# Patient Record
Sex: Male | Born: 1975
Health system: Southern US, Community
[De-identification: ages and names within clinical notes are randomized; demographics above are authoritative.]

## PROBLEM LIST (undated history)

## (undated) DIAGNOSIS — M1990 Unspecified osteoarthritis, unspecified site: Secondary | ICD-10-CM

## (undated) DIAGNOSIS — F419 Anxiety disorder, unspecified: Secondary | ICD-10-CM

## (undated) DIAGNOSIS — F329 Major depressive disorder, single episode, unspecified: Secondary | ICD-10-CM

## (undated) DIAGNOSIS — T7840XA Allergy, unspecified, initial encounter: Secondary | ICD-10-CM

## (undated) DIAGNOSIS — F32A Depression, unspecified: Secondary | ICD-10-CM

## (undated) HISTORY — PX: ABDOMINAL SURGERY: SHX537

## (undated) HISTORY — DX: Depression, unspecified: F32.A

## (undated) HISTORY — DX: Anxiety disorder, unspecified: F41.9

## (undated) HISTORY — DX: Allergy, unspecified, initial encounter: T78.40XA

## (undated) HISTORY — DX: Unspecified osteoarthritis, unspecified site: M19.90

## (undated) HISTORY — DX: Major depressive disorder, single episode, unspecified: F32.9

## (undated) HISTORY — PX: OTHER SURGICAL HISTORY: SHX169

---

## 1999-08-08 ENCOUNTER — Ambulatory Visit (HOSPITAL_COMMUNITY): Admission: RE | Admit: 1999-08-08 | Discharge: 1999-08-08 | Payer: Self-pay | Admitting: *Deleted

## 1999-08-22 ENCOUNTER — Encounter: Payer: Self-pay | Admitting: Internal Medicine

## 1999-08-22 ENCOUNTER — Ambulatory Visit (HOSPITAL_COMMUNITY): Admission: RE | Admit: 1999-08-22 | Discharge: 1999-08-22 | Payer: Self-pay | Admitting: Internal Medicine

## 1999-09-13 ENCOUNTER — Ambulatory Visit: Admission: RE | Admit: 1999-09-13 | Discharge: 1999-09-13 | Payer: Self-pay | Admitting: Internal Medicine

## 1999-09-13 ENCOUNTER — Encounter (INDEPENDENT_AMBULATORY_CARE_PROVIDER_SITE_OTHER): Payer: Self-pay | Admitting: Specialist

## 2000-07-30 ENCOUNTER — Encounter: Payer: Self-pay | Admitting: Emergency Medicine

## 2000-07-30 ENCOUNTER — Emergency Department (HOSPITAL_COMMUNITY): Admission: EM | Admit: 2000-07-30 | Discharge: 2000-07-31 | Payer: Self-pay | Admitting: Emergency Medicine

## 2001-05-03 ENCOUNTER — Emergency Department (HOSPITAL_COMMUNITY): Admission: EM | Admit: 2001-05-03 | Discharge: 2001-05-04 | Payer: Self-pay | Admitting: Emergency Medicine

## 2001-08-20 ENCOUNTER — Emergency Department (HOSPITAL_COMMUNITY): Admission: EM | Admit: 2001-08-20 | Discharge: 2001-08-20 | Payer: Self-pay | Admitting: Emergency Medicine

## 2001-10-14 ENCOUNTER — Emergency Department (HOSPITAL_COMMUNITY): Admission: EM | Admit: 2001-10-14 | Discharge: 2001-10-14 | Payer: Self-pay | Admitting: Emergency Medicine

## 2002-05-10 ENCOUNTER — Encounter: Payer: Self-pay | Admitting: Family Medicine

## 2002-05-10 ENCOUNTER — Ambulatory Visit (HOSPITAL_COMMUNITY): Admission: RE | Admit: 2002-05-10 | Discharge: 2002-05-10 | Payer: Self-pay | Admitting: Family Medicine

## 2002-05-28 ENCOUNTER — Encounter: Payer: Self-pay | Admitting: Emergency Medicine

## 2002-05-28 ENCOUNTER — Emergency Department (HOSPITAL_COMMUNITY): Admission: AD | Admit: 2002-05-28 | Discharge: 2002-05-28 | Payer: Self-pay | Admitting: Emergency Medicine

## 2002-11-22 ENCOUNTER — Emergency Department (HOSPITAL_COMMUNITY): Admission: EM | Admit: 2002-11-22 | Discharge: 2002-11-22 | Payer: Self-pay | Admitting: Emergency Medicine

## 2002-12-19 ENCOUNTER — Emergency Department (HOSPITAL_COMMUNITY): Admission: EM | Admit: 2002-12-19 | Discharge: 2002-12-20 | Payer: Self-pay | Admitting: Emergency Medicine

## 2004-03-01 ENCOUNTER — Emergency Department (HOSPITAL_COMMUNITY): Admission: EM | Admit: 2004-03-01 | Discharge: 2004-03-01 | Payer: Self-pay | Admitting: Emergency Medicine

## 2005-03-12 ENCOUNTER — Ambulatory Visit: Payer: Self-pay | Admitting: Internal Medicine

## 2005-04-29 ENCOUNTER — Ambulatory Visit: Payer: Self-pay | Admitting: Internal Medicine

## 2005-08-02 ENCOUNTER — Emergency Department (HOSPITAL_COMMUNITY): Admission: EM | Admit: 2005-08-02 | Discharge: 2005-08-02 | Payer: Self-pay | Admitting: Emergency Medicine

## 2005-09-27 ENCOUNTER — Emergency Department (HOSPITAL_COMMUNITY): Admission: EM | Admit: 2005-09-27 | Discharge: 2005-09-27 | Payer: Self-pay | Admitting: Emergency Medicine

## 2006-05-20 ENCOUNTER — Emergency Department (HOSPITAL_COMMUNITY): Admission: EM | Admit: 2006-05-20 | Discharge: 2006-05-20 | Payer: Self-pay | Admitting: Emergency Medicine

## 2007-05-06 ENCOUNTER — Emergency Department (HOSPITAL_COMMUNITY): Admission: EM | Admit: 2007-05-06 | Discharge: 2007-05-06 | Payer: Self-pay | Admitting: Emergency Medicine

## 2007-06-11 DIAGNOSIS — D869 Sarcoidosis, unspecified: Secondary | ICD-10-CM | POA: Insufficient documentation

## 2007-06-15 ENCOUNTER — Ambulatory Visit: Payer: Self-pay | Admitting: Internal Medicine

## 2007-06-15 DIAGNOSIS — E669 Obesity, unspecified: Secondary | ICD-10-CM | POA: Insufficient documentation

## 2007-06-16 LAB — CONVERTED CEMR LAB
ALT: 31 units/L (ref 0–53)
AST: 29 units/L (ref 0–37)
Alkaline Phosphatase: 43 units/L (ref 39–117)
Bilirubin, Direct: 0.1 mg/dL (ref 0.0–0.3)
CO2: 29 meq/L (ref 19–32)
Calcium: 9.3 mg/dL (ref 8.4–10.5)
Chloride: 109 meq/L (ref 96–112)
Eosinophils Relative: 3 % (ref 0.0–5.0)
Glucose, Bld: 114 mg/dL — ABNORMAL HIGH (ref 70–99)
HCT: 41.9 % (ref 39.0–52.0)
Hemoglobin, Urine: NEGATIVE
Hemoglobin: 13.5 g/dL (ref 13.0–17.0)
Monocytes Absolute: 0.3 10*3/uL (ref 0.1–1.0)
Monocytes Relative: 8.1 % (ref 3.0–12.0)
Mucus, UA: NEGATIVE
Neutro Abs: 2.2 10*3/uL (ref 1.4–7.7)
Platelets: 217 10*3/uL (ref 150–400)
Potassium: 3.7 meq/L (ref 3.5–5.1)
Sodium: 143 meq/L (ref 135–145)
Total Bilirubin: 0.9 mg/dL (ref 0.3–1.2)
Total Protein, Urine: NEGATIVE mg/dL
Total Protein: 7.1 g/dL (ref 6.0–8.3)
Urine Glucose: NEGATIVE mg/dL
WBC: 3.7 10*3/uL — ABNORMAL LOW (ref 4.5–10.5)
pH: 5.5 (ref 5.0–8.0)

## 2008-07-11 ENCOUNTER — Encounter: Admission: RE | Admit: 2008-07-11 | Discharge: 2008-07-11 | Payer: Self-pay | Admitting: Family Medicine

## 2008-08-09 ENCOUNTER — Emergency Department (HOSPITAL_COMMUNITY): Admission: EM | Admit: 2008-08-09 | Discharge: 2008-08-09 | Payer: Self-pay | Admitting: Emergency Medicine

## 2008-10-03 ENCOUNTER — Emergency Department (HOSPITAL_COMMUNITY): Admission: EM | Admit: 2008-10-03 | Discharge: 2008-10-03 | Payer: Self-pay | Admitting: Emergency Medicine

## 2009-02-08 ENCOUNTER — Emergency Department (HOSPITAL_COMMUNITY): Admission: EM | Admit: 2009-02-08 | Discharge: 2009-02-08 | Payer: Self-pay | Admitting: Emergency Medicine

## 2009-03-16 ENCOUNTER — Emergency Department (HOSPITAL_COMMUNITY): Admission: EM | Admit: 2009-03-16 | Discharge: 2009-03-16 | Payer: Self-pay | Admitting: Emergency Medicine

## 2009-05-13 ENCOUNTER — Emergency Department (HOSPITAL_COMMUNITY): Admission: EM | Admit: 2009-05-13 | Discharge: 2009-05-13 | Payer: Self-pay | Admitting: Emergency Medicine

## 2009-07-03 ENCOUNTER — Emergency Department (HOSPITAL_COMMUNITY): Admission: EM | Admit: 2009-07-03 | Discharge: 2009-07-03 | Payer: Self-pay | Admitting: Emergency Medicine

## 2009-09-06 ENCOUNTER — Emergency Department (HOSPITAL_COMMUNITY): Admission: EM | Admit: 2009-09-06 | Discharge: 2009-09-06 | Payer: Self-pay | Admitting: Emergency Medicine

## 2010-01-02 ENCOUNTER — Emergency Department (HOSPITAL_COMMUNITY): Admission: EM | Admit: 2010-01-02 | Discharge: 2010-01-02 | Payer: Self-pay | Admitting: Emergency Medicine

## 2010-05-22 LAB — GC/CHLAMYDIA PROBE AMP, GENITAL

## 2010-05-22 LAB — URINALYSIS, ROUTINE W REFLEX MICROSCOPIC
Nitrite: NEGATIVE
Specific Gravity, Urine: 1.02 (ref 1.005–1.030)
Urobilinogen, UA: 1 mg/dL (ref 0.0–1.0)

## 2010-06-11 LAB — URINE MICROSCOPIC-ADD ON

## 2010-06-11 LAB — URINALYSIS, ROUTINE W REFLEX MICROSCOPIC
Bilirubin Urine: NEGATIVE
Hgb urine dipstick: NEGATIVE
Nitrite: NEGATIVE
Specific Gravity, Urine: 1.032 — ABNORMAL HIGH (ref 1.005–1.030)
pH: 5.5 (ref 5.0–8.0)

## 2010-06-11 LAB — GC/CHLAMYDIA PROBE AMP, GENITAL

## 2010-07-20 NOTE — Consult Note (Signed)
NAME:  Grant Miller, Grant Miller                         ACCOUNT NO.:  [id number]   MEDICAL RECORD NO.:  [id number]                   PATIENT TYPE:  EMS   LOCATION:  [facility]                                 FACILITY:  [facility]   PHYSICIAN:  Colleen Can. Deborah Chalk, M.D.            DATE OF BIRTH:  [birth year]   DATE OF CONSULTATION:  05/28/2002  DATE OF DISCHARGE:  05/28/2002                                   CONSULTATION   HISTORY:  Grant Miller is a 35 year old black male with a history of pulmonary  sarcoid.  He had the onset of shortness of breath today, similar to previous  episodes from the sarcoid.  He has currently been on prednisone per Dr.  Sherene Sires.  He took several puffs of his brother's inhaler.  He had the onset of  a tachycardia, associated anxiety, nausea, vomiting, and shortness of  breath.  EMS was called.  The patient was noted to have a blood pressure of  40 palpable and a heart rate of 322.  The QRS complex was .11.  He was  cardioverted with 50 watt seconds to normal sinus rhythm. He has had some  degree of left arm pain, both before and after the cardioversion but  otherwise has been doing well.   SOCIAL HISTORY:  He is seen with both parents and an aunt, who he lives  with.  He works Curator work.  He does not smoke.  He will  drink alcohol, 4-6 beers every other day but have periods of not drinking.  He has used cocaine in the past, the last being approximately 5-6 days ago.   PAST MEDICAL HISTORY:  Otherwise is unremarkable.   CURRENT MEDICATIONS:  Prednisone 10 mg twice a day.   PHYSICAL EXAMINATION:  GENERAL: He is a pleasant black male but is difficult  to understand. His voice is low.  VITAL SIGNS:  Blood pressure is 123/79.  Heart rate is 71.  SKIN: Warm and dry.  LUNGS:  Clear.  HEART:  Shows a regular rate and rhythm.  Currently no murmur, no gallop.  EXTREMITIES: Without edema.  NEUROLOGICAL:  Speech is somewhat slurred and hard to understand but no  neurological deficit.   LABORATORY DATA:  Chest x-ray shows mild cardiomegaly and a patchy  infiltrate in the left upper lobe.   The EKG initially showed SVT on the monitor.  Subsequent EKG showed normal  sinus rhythm with T-wave inversion in the inferior and lateral leads.   CK total is 323 with an MB of 10.6.  Troponin 0.19.  Potassium is 3.4.   OVERALL IMPRESSION:  1. Supraventricular tachycardia, probably secondary to inhaler use with     elevation of CK-MB felt to be secondarily related to cardioversion.  2. Sarcoidosis.    PLAN:  We will ask him to stay away from bronchodilators, ethanol, and  recreational drugs.  He will continue his prednisone and follow up  per Dr.  Sherene Sires.  He will rest over the next 2-3 days.  We will arrange for a 2D  echocardiogram and follow up with Dr. Deborah Chalk as an outpatient over the next  1-2 weeks.                                               Colleen Can. Deborah Chalk, M.D.    SNT/MEDQ  D:  05/28/2002  T:  05/30/2002  Job:  [id number]

## 2010-07-20 NOTE — Procedures (Signed)
[facility]  Patient:    Grant Miller, Grant Miller                        MRN: [id number] Proc. Date: 09/13/99 Attending:  Charlaine Dalton. Sherene Sires, M.D. [facility] CC:         Crista Luria, M.D., [facility]                           Procedure Report  REQUESTING PHYSICIAN:  Crista Luria, M.D.  PROCEDURE:  Fiberoptic bronchoscopy with transbronchial biopsy of the left lower lobe.  SURGEON:  Charlaine Dalton. Sherene Sires, M.D.  HISTORY AND INDICATIONS:  This is a 35 year old black male with persistent cough for three months and evidence of diffuse interstitial lung disease associated with adenopathy on chest x-ray.  He had initially declined a biopsy when I saw him on September 11, 1999, but his mother talked him into it today after I had discussed the risks, benefits and alternatives with him in detail in the office.  DESCRIPTION OF PROCEDURE:  The procedure was done in the bronchoscopy suite with continuous monitoring by surface ECG and oximetry.  He received a total of 50 mg of IV Demerol and 7.5 mg of IV Versed for adequate sedation and cough suppression.  The right naris and oropharynx had been anesthetized with ______  lidocaine spray. The right naris was additionally prepared with 2% lidocaine jelly.  Using a standard flexible fiberoptic bronchoscope, the right naris was easily cannulated and there was good visualization of the oropharynx and larynx with no apparent abnormalities and the cords appeared to move normally.  Using additional 1% lidocaine as needed, the entire tracheobronchial tree was explored bilaterally with the following findings:  There was minimal erythema and edema of the airways diffuse, with no focal endobronchial lesion.  PROCEDURE:  Using a standard transbronchial wedge position, left lower lobe, basilar segment laterally, four adequate biopsies were obtained by standard transbronchial technique, with no obvious bleeding nor any evidence of pneumothorax by  fluoroscopy.  Additionally, the lingula was lavaged.  IMPRESSION:  Interstitial lung disease associated with adenopathy, consistent with sarcoid.  RECOMMENDATION:  Await the following studies: 1. Transbronchial biopsy of the left lower lobe. 2. Bronchoalveolar lavage of the lingula for AFB and fungal stain and culture.  In the meantime, the patient will continue prednisone 20 mg per day.  The patient tolerated the procedure well and followup chest x-ray is pending at the time of dictation. DD:  09/13/99 TD:  09/13/99 Job: [id number]

## 2011-03-19 DIAGNOSIS — R5381 Other malaise: Secondary | ICD-10-CM | POA: Diagnosis not present

## 2011-03-19 DIAGNOSIS — Z79899 Other long term (current) drug therapy: Secondary | ICD-10-CM | POA: Diagnosis not present

## 2011-03-19 DIAGNOSIS — R5383 Other fatigue: Secondary | ICD-10-CM | POA: Diagnosis not present

## 2011-03-19 DIAGNOSIS — D72819 Decreased white blood cell count, unspecified: Secondary | ICD-10-CM | POA: Diagnosis not present

## 2011-04-02 ENCOUNTER — Telehealth: Payer: Self-pay | Admitting: Oncology

## 2011-04-02 NOTE — Telephone Encounter (Signed)
Called pt @ number listed under home and on referral but was not able to reach him or leave message due to vm full. Called number listed under mobile and lmonvm asking pt to call me re appt w/FS.

## 2011-04-04 ENCOUNTER — Telehealth: Payer: Self-pay | Admitting: Oncology

## 2011-04-04 NOTE — Telephone Encounter (Signed)
Still not able to reach pt re appt w/FS. Not able to reach pt @ [phone] - called [phone] and lmonvm for pt re calling me for appt w/FS also informed pt that I am only able to hold onto referral for 72hrs before releasing it and informing referring office that pt could not be reached. Called dr Min's office @ [facility] [phone]) and s/w Shanda Bumps re above and asked for additional contact information. Shanda Bumps provided the same numbers as listed above and was made aware that I will hold onto the chart until tomorrow but if I don't hear from pt referral would be released and if pt wishes to schedule appt he can call us or contact Dr. Dorthula Rue office.

## 2011-04-08 ENCOUNTER — Telehealth: Payer: Self-pay | Admitting: Oncology

## 2011-04-08 ENCOUNTER — Ambulatory Visit (INDEPENDENT_AMBULATORY_CARE_PROVIDER_SITE_OTHER): Payer: Self-pay | Admitting: Surgery

## 2011-04-08 NOTE — Telephone Encounter (Signed)
S/w pt today re appt for 2/8 @ 1:30 pm w/FS.

## 2011-04-10 ENCOUNTER — Telehealth: Payer: Self-pay | Admitting: Oncology

## 2011-04-10 NOTE — Telephone Encounter (Signed)
Del.Chart °

## 2011-04-11 ENCOUNTER — Other Ambulatory Visit: Payer: Self-pay | Admitting: Oncology

## 2011-04-11 DIAGNOSIS — F329 Major depressive disorder, single episode, unspecified: Secondary | ICD-10-CM | POA: Diagnosis not present

## 2011-04-11 DIAGNOSIS — F411 Generalized anxiety disorder: Secondary | ICD-10-CM | POA: Diagnosis not present

## 2011-04-11 DIAGNOSIS — D141 Benign neoplasm of larynx: Secondary | ICD-10-CM | POA: Diagnosis not present

## 2011-04-11 DIAGNOSIS — J387 Other diseases of larynx: Secondary | ICD-10-CM | POA: Diagnosis not present

## 2011-04-11 DIAGNOSIS — Z01818 Encounter for other preprocedural examination: Secondary | ICD-10-CM | POA: Diagnosis not present

## 2011-04-11 DIAGNOSIS — D72829 Elevated white blood cell count, unspecified: Secondary | ICD-10-CM

## 2011-04-12 ENCOUNTER — Ambulatory Visit: Payer: Self-pay | Admitting: Oncology

## 2011-04-12 ENCOUNTER — Ambulatory Visit: Payer: Medicare Other

## 2011-04-12 ENCOUNTER — Other Ambulatory Visit: Payer: Medicare Other

## 2011-04-12 DIAGNOSIS — D72829 Elevated white blood cell count, unspecified: Secondary | ICD-10-CM

## 2011-04-15 DIAGNOSIS — J383 Other diseases of vocal cords: Secondary | ICD-10-CM | POA: Diagnosis not present

## 2011-04-15 DIAGNOSIS — L988 Other specified disorders of the skin and subcutaneous tissue: Secondary | ICD-10-CM | POA: Diagnosis not present

## 2011-04-15 DIAGNOSIS — J381 Polyp of vocal cord and larynx: Secondary | ICD-10-CM | POA: Diagnosis not present

## 2011-07-17 ENCOUNTER — Telehealth: Payer: Self-pay | Admitting: *Deleted

## 2011-07-17 NOTE — Telephone Encounter (Signed)
Call forwarded to this nurse from scheduling. Rec'd call from patient that was to be seen in 04/2011 as a new patient consult from Dr Dellis Filbert @ [phone]) On day of appt, patient received a call while waiting in the lobby and could not complete appts that day. His physician has asked him to call us to r/s this consult. We will have to obtain records again as we were unable to contact the patient for r/s back in 04/2011 and referral was cancelled. Will send orders to scheduling to have this appt rescheduled in the next 1-2 weeks and acquire records again from the original referring MD. Patient call back number is [phone], he ask if we get his voicemail to PLEASE leave a message with name and call back number.

## 2011-07-18 ENCOUNTER — Telehealth: Payer: Self-pay | Admitting: Oncology

## 2011-07-18 NOTE — Telephone Encounter (Signed)
S/w th3e pt and he is aware of his r/s appt on 07/31/2011@10 :00am

## 2011-07-23 ENCOUNTER — Telehealth: Payer: Self-pay | Admitting: Oncology

## 2011-07-23 NOTE — Telephone Encounter (Signed)
Referred by Min Dx- Low WBC

## 2011-07-26 ENCOUNTER — Other Ambulatory Visit: Payer: Self-pay | Admitting: Oncology

## 2011-07-26 DIAGNOSIS — D72819 Decreased white blood cell count, unspecified: Secondary | ICD-10-CM

## 2011-07-31 ENCOUNTER — Ambulatory Visit (HOSPITAL_BASED_OUTPATIENT_CLINIC_OR_DEPARTMENT_OTHER): Payer: Medicare Other | Admitting: Oncology

## 2011-07-31 ENCOUNTER — Other Ambulatory Visit: Payer: Medicare Other | Admitting: Lab

## 2011-07-31 ENCOUNTER — Ambulatory Visit: Payer: Medicare Other

## 2011-07-31 ENCOUNTER — Encounter: Payer: Self-pay | Admitting: Oncology

## 2011-07-31 ENCOUNTER — Telehealth: Payer: Self-pay | Admitting: Oncology

## 2011-07-31 VITALS — BP 136/74 | HR 72 | Temp 98.4°F | Ht 70.0 in | Wt 222.8 lb

## 2011-07-31 DIAGNOSIS — D72819 Decreased white blood cell count, unspecified: Secondary | ICD-10-CM

## 2011-07-31 DIAGNOSIS — E663 Overweight: Secondary | ICD-10-CM | POA: Diagnosis not present

## 2011-07-31 DIAGNOSIS — D869 Sarcoidosis, unspecified: Secondary | ICD-10-CM

## 2011-07-31 LAB — COMPREHENSIVE METABOLIC PANEL
ALT: 26 U/L (ref 0–53)
AST: 23 U/L (ref 0–37)
Alkaline Phosphatase: 65 U/L (ref 39–117)
Sodium: 141 mEq/L (ref 135–145)
Total Bilirubin: 0.5 mg/dL (ref 0.3–1.2)
Total Protein: 6.7 g/dL (ref 6.0–8.3)

## 2011-07-31 LAB — CBC WITH DIFFERENTIAL/PLATELET
EOS%: 3.2 % (ref 0.0–7.0)
Eosinophils Absolute: 0.1 10*3/uL (ref 0.0–0.5)
MCH: 27.8 pg (ref 27.2–33.4)
MCV: 85 fL (ref 79.3–98.0)
MONO%: 7.8 % (ref 0.0–14.0)
NEUT#: 1.6 10*3/uL (ref 1.5–6.5)
RBC: 4.7 10*6/uL (ref 4.20–5.82)
RDW: 13.4 % (ref 11.0–14.6)
nRBC: 0 % (ref 0–0)

## 2011-07-31 NOTE — Progress Notes (Signed)
Patient came in today he was set up as a new patient , patient had already saw a financial advocate on 04/12/11, patient was not in need of fin. Visit today.
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
## 2011-07-31 NOTE — Progress Notes (Signed)
Note dictated

## 2011-07-31 NOTE — Telephone Encounter (Signed)
appts made and printed for pt, i cx his ref to estab with [facility] because pt has [facility] which means he has a phy that has been assigned to him,pt confirmed that yes he has a dr in [place].  Pt advised that  He can call is sw to get card changed   aom

## 2012-01-22 ENCOUNTER — Other Ambulatory Visit: Payer: Medicare Other | Admitting: Lab

## 2012-01-22 ENCOUNTER — Ambulatory Visit: Payer: Medicare Other | Admitting: Oncology

## 2012-01-27 ENCOUNTER — Telehealth: Payer: Self-pay | Admitting: Oncology

## 2012-01-27 NOTE — Telephone Encounter (Signed)
pt missed appt and needed rescheduling...done

## 2012-02-07 ENCOUNTER — Telehealth: Payer: Self-pay | Admitting: Oncology

## 2012-02-07 ENCOUNTER — Ambulatory Visit (HOSPITAL_BASED_OUTPATIENT_CLINIC_OR_DEPARTMENT_OTHER): Payer: Medicare Other | Admitting: Oncology

## 2012-02-07 ENCOUNTER — Other Ambulatory Visit (HOSPITAL_BASED_OUTPATIENT_CLINIC_OR_DEPARTMENT_OTHER): Payer: Medicare Other

## 2012-02-07 VITALS — BP 120/74 | HR 68 | Temp 98.8°F | Resp 20 | Ht 70.0 in | Wt 225.3 lb

## 2012-02-07 DIAGNOSIS — E663 Overweight: Secondary | ICD-10-CM | POA: Diagnosis not present

## 2012-02-07 DIAGNOSIS — D869 Sarcoidosis, unspecified: Secondary | ICD-10-CM

## 2012-02-07 DIAGNOSIS — D709 Neutropenia, unspecified: Secondary | ICD-10-CM | POA: Diagnosis not present

## 2012-02-07 DIAGNOSIS — D72819 Decreased white blood cell count, unspecified: Secondary | ICD-10-CM

## 2012-02-07 LAB — CBC WITH DIFFERENTIAL/PLATELET
BASO%: 0.7 % (ref 0.0–2.0)
EOS%: 2.9 % (ref 0.0–7.0)
HCT: 42.2 % (ref 38.4–49.9)
LYMPH%: 52.2 % — ABNORMAL HIGH (ref 14.0–49.0)
MCH: 28.1 pg (ref 27.2–33.4)
MCHC: 33.3 g/dL (ref 32.0–36.0)
NEUT%: 33.8 % — ABNORMAL LOW (ref 39.0–75.0)
Platelets: 227 10*3/uL (ref 140–400)

## 2012-02-07 NOTE — Progress Notes (Signed)
Hematology and Oncology Follow Up Visit  Rajendra Spiller [id number] 09-22-1975 36 y.o. 02/07/2012 2:59 PM   Principle Diagnosis: 36 year old with mild neutropenia. This is likely due to benign fluctuating process.    Current therapy: Observation and follow up.   Interim History: Mr. Hickam presents today for a follow up visit. He is a very nice man I saw on 07/2011 for neutropenia. 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 He is reporting mild fatigue but no other issues.  He had not had any fevers, had not had any chills, had not had any recurrent sinopulmonary infection,  had not had any recent hospitalization or illnesses. He has some intentional weight loss at this point.   Medications: I have reviewed the patient's current medications. Current outpatient prescriptions:Diphenhydramine-APAP, sleep, (TYLENOL PM EXTRA STRENGTH PO), Take by mouth., Disp: , Rfl:   Allergies:  Allergies  Allergen Reactions  . Iodine     Past Medical History, Surgical history, Social history, and Family History were reviewed and updated.  Review of Systems: Constitutional:  Negative for fever, chills, night sweats, anorexia, weight loss, pain. Cardiovascular: no chest pain or dyspnea on exertion Respiratory: negative Neurological: negative Dermatological: negative ENT: negative Skin: Negative. Gastrointestinal: negative Genito-Urinary: negative Hematological and Lymphatic: negative Breast: negative Musculoskeletal: negative Remaining ROS negative. Physical Exam: Blood pressure 120/74, pulse 68, temperature 98.8 F (37.1 C), temperature source Oral, resp. rate 20, height 5\' 10"  (1.778 m), weight 225 lb 4.8 oz (102.195 kg). ECOG: 0 General appearance: alert Head: Normocephalic, without obvious abnormality, atraumatic Neck: no adenopathy, no carotid bruit, no JVD, supple, symmetrical, trachea midline and thyroid not enlarged, symmetric, no tenderness/mass/nodules Lymph nodes: Cervical, supraclavicular, and  axillary nodes normal. Heart:regular rate and rhythm, S1, S2 normal, no murmur, click, rub or gallop Lung:chest clear, no wheezing, rales, normal symmetric air entry Abdomin: soft, non-tender, without masses or organomegaly EXT:no erythema, induration, or nodules   Lab Results: Lab Results  Component Value Date   WBC 4.0 02/07/2012   HGB 14.0 02/07/2012   HCT 42.2 02/07/2012   MCV 84.4 02/07/2012   PLT 227 02/07/2012     Chemistry      Component Value Date/Time   NA 141 07/31/2011 0925   K 3.9 07/31/2011 0925   CL 107 07/31/2011 0925   CO2 26 07/31/2011 0925   BUN 14 07/31/2011 0925   CREATININE 1.04 07/31/2011 0925      Component Value Date/Time   CALCIUM 9.3 07/31/2011 0925   ALKPHOS 65 07/31/2011 0925   AST 23 07/31/2011 0925   ALT 26 07/31/2011 0925   BILITOT 0.5 07/31/2011 0925       Impression and Plan:  A 36 year old gentleman with the following issues.  1. Fluctuating mild leukocytopenia. His white cell count has fluctuated between 3.5 to a normal range of about 4. His counts are close to normal today. This is likely more of a benign etiology. However, I would like to continue to monitor him, remeasure his white cell count again, and review his peripheral  smear in about 4-6 months. Certainly if there are any abnormalities, will revisit that at that point.  2. Complaint of fatigue, tiredness. I have referred him to primary care today for management of his presumed thyroid disease.    Ziah Leandro, MD 12/6/20132:59 PM

## 2012-02-07 NOTE — Telephone Encounter (Signed)
appts made and printed for Grant Miller Grant Miller advised that due to having mcr and medicaid he will need to call for a provider list taking new pts,Grant Miller understands.

## 2012-08-05 ENCOUNTER — Telehealth: Payer: Self-pay | Admitting: Oncology

## 2012-08-05 ENCOUNTER — Ambulatory Visit (HOSPITAL_BASED_OUTPATIENT_CLINIC_OR_DEPARTMENT_OTHER): Payer: Medicare Other | Admitting: Oncology

## 2012-08-05 ENCOUNTER — Other Ambulatory Visit (HOSPITAL_BASED_OUTPATIENT_CLINIC_OR_DEPARTMENT_OTHER): Payer: Medicare Other | Admitting: Lab

## 2012-08-05 VITALS — BP 137/82 | HR 83 | Temp 98.0°F | Resp 18 | Ht 70.0 in | Wt 229.0 lb

## 2012-08-05 DIAGNOSIS — R5381 Other malaise: Secondary | ICD-10-CM

## 2012-08-05 DIAGNOSIS — D709 Neutropenia, unspecified: Secondary | ICD-10-CM

## 2012-08-05 DIAGNOSIS — D72819 Decreased white blood cell count, unspecified: Secondary | ICD-10-CM | POA: Diagnosis not present

## 2012-08-05 DIAGNOSIS — R5383 Other fatigue: Secondary | ICD-10-CM | POA: Diagnosis not present

## 2012-08-05 LAB — CBC WITH DIFFERENTIAL/PLATELET
Basophils Absolute: 0 10*3/uL (ref 0.0–0.1)
EOS%: 2.1 % (ref 0.0–7.0)
HGB: 13.9 g/dL (ref 13.0–17.1)
LYMPH%: 48.3 % (ref 14.0–49.0)
MCH: 27.7 pg (ref 27.2–33.4)
MCV: 84 fL (ref 79.3–98.0)
MONO%: 6.3 % (ref 0.0–14.0)
Platelets: 220 10*3/uL (ref 140–400)
RBC: 5.03 10*6/uL (ref 4.20–5.82)
RDW: 13.2 % (ref 11.0–14.6)

## 2012-08-05 NOTE — Progress Notes (Signed)
Hematology and Oncology Follow Up Visit  Grant Miller [id number] 02/23/1976 37 y.o. 08/05/2012 9:53 AM   Principle Diagnosis: 37 year old with mild neutropenia. This is likely due to benign fluctuating process.    Current therapy: Observation and follow up.   Interim History: Grant Miller presents today for a follow up visit. He is a very nice man I saw on 07/2011 for neutropenia. 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 He is reporting mild fatigue but no other issues.  He had not had any fevers, had not had any chills, had not had any recurrent sinopulmonary infection,  had not had any recent hospitalization or illnesses. He has some intentional weight loss at this point. He reports eating better and his quality of life is a lot better at this time.    Medications: I have reviewed the patient's current medications.   Current Outpatient Prescriptions  Medication Sig Dispense Refill  . Diphenhydramine-APAP, sleep, (TYLENOL PM EXTRA STRENGTH PO) Take by mouth.      Marland Kitchen ibuprofen (ADVIL,MOTRIN) 200 MG tablet Take 200 mg by mouth every 8 (eight) hours as needed for pain. As needed for discomfort       No current facility-administered medications for this visit.    Allergies:  Allergies  Allergen Reactions  . Iodine     Past Medical History, Surgical history, Social history, and Family History were reviewed and updated.  Review of Systems: Constitutional:  Negative for fever, chills, night sweats, anorexia, weight loss, pain. Cardiovascular: no chest pain or dyspnea on exertion Respiratory: negative Neurological: negative Dermatological: negative ENT: negative Skin: Negative. Gastrointestinal: negative Genito-Urinary: negative Hematological and Lymphatic: negative Breast: negative Musculoskeletal: negative Remaining ROS negative. Physical Exam: Blood pressure 137/82, pulse 83, temperature 98 F (36.7 C), temperature source Oral, resp. rate 18, height 5\' 10"  (1.778 m), weight 229 lb  (103.874 kg). ECOG: 0 General appearance: alert Head: Normocephalic, without obvious abnormality, atraumatic Neck: no adenopathy, no carotid bruit, no JVD, supple, symmetrical, trachea midline and thyroid not enlarged, symmetric, no tenderness/mass/nodules Lymph nodes: Cervical, supraclavicular, and axillary nodes normal. Heart:regular rate and rhythm, S1, S2 normal, no murmur, click, rub or gallop Lung:chest clear, no wheezing, rales, normal symmetric air entry Abdomin: soft, non-tender, without masses or organomegaly EXT:no erythema, induration, or nodules   Lab Results: Lab Results  Component Value Date   WBC 4.0 08/05/2012   HGB 13.9 08/05/2012   HCT 42.2 08/05/2012   MCV 84.0 08/05/2012   PLT 220 08/05/2012     Chemistry      Component Value Date/Time   NA 141 07/31/2011 0925   K 3.9 07/31/2011 0925   CL 107 07/31/2011 0925   CO2 26 07/31/2011 0925   BUN 14 07/31/2011 0925   CREATININE 1.04 07/31/2011 0925      Component Value Date/Time   CALCIUM 9.3 07/31/2011 0925   ALKPHOS 65 07/31/2011 0925   AST 23 07/31/2011 0925   ALT 26 07/31/2011 0925   BILITOT 0.5 07/31/2011 0925       Impression and Plan:  A 37 year old gentleman with the following issues.  1. Fluctuating mild leukocytopenia. His white cell count has fluctuated between 3.5 to a normal range of about 4. His counts are  normal today. This is likely more of a benign etiology. However, I would like to continue to monitor him, remeasure his white cell count again, and review his peripheral  smear in about 6 months. Certainly if there are any abnormalities, will revisit that at that point.  2. Complaint of fatigue, tiredness. It seems to have improved today.    [facility], MD 6/4/20149:53 AM

## 2012-12-17 DIAGNOSIS — H31009 Unspecified chorioretinal scars, unspecified eye: Secondary | ICD-10-CM | POA: Diagnosis not present

## 2012-12-17 DIAGNOSIS — H47039 Optic nerve hypoplasia, unspecified eye: Secondary | ICD-10-CM | POA: Diagnosis not present

## 2013-02-04 ENCOUNTER — Other Ambulatory Visit (HOSPITAL_BASED_OUTPATIENT_CLINIC_OR_DEPARTMENT_OTHER): Payer: Medicare Other | Admitting: Lab

## 2013-02-04 ENCOUNTER — Ambulatory Visit (HOSPITAL_BASED_OUTPATIENT_CLINIC_OR_DEPARTMENT_OTHER): Payer: Medicare Other | Admitting: Oncology

## 2013-02-04 VITALS — BP 134/102 | HR 71 | Temp 98.8°F | Resp 18 | Ht 70.0 in | Wt 240.7 lb

## 2013-02-04 DIAGNOSIS — R5381 Other malaise: Secondary | ICD-10-CM

## 2013-02-04 DIAGNOSIS — D869 Sarcoidosis, unspecified: Secondary | ICD-10-CM

## 2013-02-04 DIAGNOSIS — D709 Neutropenia, unspecified: Secondary | ICD-10-CM | POA: Diagnosis not present

## 2013-02-04 DIAGNOSIS — E663 Overweight: Secondary | ICD-10-CM

## 2013-02-04 DIAGNOSIS — D72819 Decreased white blood cell count, unspecified: Secondary | ICD-10-CM | POA: Diagnosis not present

## 2013-02-04 LAB — CBC WITH DIFFERENTIAL/PLATELET
BASO%: 1.2 % (ref 0.0–2.0)
EOS%: 2.1 % (ref 0.0–7.0)
Eosinophils Absolute: 0.1 10*3/uL (ref 0.0–0.5)
LYMPH%: 50 % — ABNORMAL HIGH (ref 14.0–49.0)
MCHC: 32.4 g/dL (ref 32.0–36.0)
MCV: 85.6 fL (ref 79.3–98.0)
MONO%: 6.6 % (ref 0.0–14.0)
NEUT#: 1.5 10*3/uL (ref 1.5–6.5)
RBC: 4.85 10*6/uL (ref 4.20–5.82)
RDW: 13.3 % (ref 11.0–14.6)

## 2013-02-04 LAB — COMPREHENSIVE METABOLIC PANEL (CC13)
ALT: 33 U/L (ref 0–55)
AST: 32 U/L (ref 5–34)
Albumin: 4.1 g/dL (ref 3.5–5.0)
Alkaline Phosphatase: 58 U/L (ref 40–150)
Potassium: 3.6 mEq/L (ref 3.5–5.1)
Sodium: 143 mEq/L (ref 136–145)
Total Bilirubin: 0.41 mg/dL (ref 0.20–1.20)
Total Protein: 7.2 g/dL (ref 6.4–8.3)

## 2013-02-04 NOTE — Progress Notes (Signed)
Hematology and Oncology Follow Up Visit  Grant Miller [id number] June 30, 1975 37 y.o. 02/04/2013 8:21 AM   Principle Diagnosis: 37 year old with mild neutropenia. This is likely due to benign fluctuating process.    Current therapy: Observation and follow up.   Interim History: Grant Miller presents today for a follow up visit. He is a very nice man I saw on 07/2011 for neutropenia. 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 He is not reporting any new complaints.  He had not had any fevers, had not had any chills, had not had any recurrent sinopulmonary infection,  had not had any recent hospitalization or illnesses. He has some intentional weight loss at this point. He reports eating better and his quality of life is a lot better at this time.    Medications: I have reviewed the patient's current medications.   Current Outpatient Prescriptions  Medication Sig Dispense Refill  . Diphenhydramine-APAP, sleep, (TYLENOL PM EXTRA STRENGTH PO) Take by mouth.      Marland Kitchen ibuprofen (ADVIL,MOTRIN) 200 MG tablet Take 200 mg by mouth every 8 (eight) hours as needed for pain. As needed for discomfort       No current facility-administered medications for this visit.    Allergies:  Allergies  Allergen Reactions  . Iodine     Past Medical History, Surgical history, Social history, and Family History were reviewed and updated.  Review of Systems:  Remaining ROS negative. Physical Exam: Blood pressure 134/102, pulse 71, temperature 98.8 F (37.1 C), temperature source Oral, resp. rate 18, height 5\' 10"  (1.778 m), weight 240 lb 11.2 oz (109.181 kg). ECOG: 0 General appearance: alert Head: Normocephalic, without obvious abnormality, atraumatic Neck: no adenopathy, no carotid bruit, no JVD, supple, symmetrical, trachea midline and thyroid not enlarged, symmetric, no tenderness/mass/nodules Lymph nodes: Cervical, supraclavicular, and axillary nodes normal. Heart:regular rate and rhythm, S1, S2 normal, no murmur,  click, rub or gallop Lung:chest clear, no wheezing, rales, normal symmetric air entry Abdomin: soft, non-tender, without masses or organomegaly EXT:no erythema, induration, or nodules   Lab Results: Lab Results  Component Value Date   WBC 3.9* 02/04/2013   HGB 13.5 02/04/2013   HCT 41.5 02/04/2013   MCV 85.6 02/04/2013   PLT 204 02/04/2013     Chemistry      Component Value Date/Time   NA 141 07/31/2011 0925   K 3.9 07/31/2011 0925   CL 107 07/31/2011 0925   CO2 26 07/31/2011 0925   BUN 14 07/31/2011 0925   CREATININE 1.04 07/31/2011 0925      Component Value Date/Time   CALCIUM 9.3 07/31/2011 0925   ALKPHOS 65 07/31/2011 0925   AST 23 07/31/2011 0925   ALT 26 07/31/2011 0925   BILITOT 0.5 07/31/2011 0925       Impression and Plan:  A 37 year old gentleman with the following issues.  1. Fluctuating mild leukocytopenia. His white cell count has fluctuated between 3.5 to a normal range of about 4. His counts are close to normal today. This is likely more of a benign etiology. However, I would like to continue to monitor him, remeasure his white cell count again, and review his peripheral smear in about 6 months. Certainly if there are any abnormalities, will revisit that at that point.  2. Complaint of fatigue, tiredness. It seems to have improved today.   3. Health maintenance issues: I have referred him for a primary care physician to establish care in the near future.   [facility], MD 12/4/20148:21 AM

## 2013-02-05 ENCOUNTER — Telehealth: Payer: Self-pay | Admitting: Oncology

## 2013-02-05 NOTE — Telephone Encounter (Signed)
s.w. pt and advised on June 2015 appt....S.w. Pt and gv pt information for [place] to establish primary care.Marland KitchenMarland KitchenMarland Kitchen

## 2013-08-02 ENCOUNTER — Telehealth: Payer: Self-pay | Admitting: Oncology

## 2013-08-02 NOTE — Telephone Encounter (Signed)
returned pt call adn r/s appt ...done...pt aware of new d.t

## 2013-08-05 ENCOUNTER — Encounter: Payer: Self-pay | Admitting: Oncology

## 2013-08-05 ENCOUNTER — Telehealth: Payer: Self-pay | Admitting: Oncology

## 2013-08-05 ENCOUNTER — Ambulatory Visit (HOSPITAL_BASED_OUTPATIENT_CLINIC_OR_DEPARTMENT_OTHER): Payer: Medicare Other | Admitting: Oncology

## 2013-08-05 ENCOUNTER — Other Ambulatory Visit (HOSPITAL_BASED_OUTPATIENT_CLINIC_OR_DEPARTMENT_OTHER): Payer: Medicare Other

## 2013-08-05 VITALS — BP 114/72 | HR 103 | Temp 97.8°F | Resp 18 | Ht 70.0 in | Wt 234.4 lb

## 2013-08-05 DIAGNOSIS — E663 Overweight: Secondary | ICD-10-CM

## 2013-08-05 DIAGNOSIS — D708 Other neutropenia: Secondary | ICD-10-CM

## 2013-08-05 DIAGNOSIS — D869 Sarcoidosis, unspecified: Secondary | ICD-10-CM

## 2013-08-05 DIAGNOSIS — D709 Neutropenia, unspecified: Secondary | ICD-10-CM

## 2013-08-05 DIAGNOSIS — D72819 Decreased white blood cell count, unspecified: Secondary | ICD-10-CM

## 2013-08-05 LAB — CBC WITH DIFFERENTIAL/PLATELET
BASO%: 0.8 % (ref 0.0–2.0)
Basophils Absolute: 0 10*3/uL (ref 0.0–0.1)
EOS%: 2.1 % (ref 0.0–7.0)
Eosinophils Absolute: 0.1 10*3/uL (ref 0.0–0.5)
HEMATOCRIT: 42.4 % (ref 38.4–49.9)
HGB: 13.9 g/dL (ref 13.0–17.1)
LYMPH#: 2.1 10*3/uL (ref 0.9–3.3)
LYMPH%: 45.6 % (ref 14.0–49.0)
MCH: 27.5 pg (ref 27.2–33.4)
MCHC: 32.6 g/dL (ref 32.0–36.0)
MCV: 84.4 fL (ref 79.3–98.0)
MONO#: 0.3 10*3/uL (ref 0.1–0.9)
MONO%: 7 % (ref 0.0–14.0)
NEUT#: 2.1 10*3/uL (ref 1.5–6.5)
NEUT%: 44.5 % (ref 39.0–75.0)
Platelets: 248 10*3/uL (ref 140–400)
RBC: 5.03 10*6/uL (ref 4.20–5.82)
RDW: 13.1 % (ref 11.0–14.6)
WBC: 4.7 10*3/uL (ref 4.0–10.3)

## 2013-08-05 LAB — COMPREHENSIVE METABOLIC PANEL (CC13)
ALBUMIN: 4.3 g/dL (ref 3.5–5.0)
ALT: 36 U/L (ref 0–55)
AST: 31 U/L (ref 5–34)
Alkaline Phosphatase: 54 U/L (ref 40–150)
Anion Gap: 14 mEq/L — ABNORMAL HIGH (ref 3–11)
BUN: 17.2 mg/dL (ref 7.0–26.0)
CHLORIDE: 107 meq/L (ref 98–109)
CO2: 20 meq/L — AB (ref 22–29)
Calcium: 9.6 mg/dL (ref 8.4–10.4)
Creatinine: 1.2 mg/dL (ref 0.7–1.3)
Glucose: 104 mg/dl (ref 70–140)
Potassium: 3.7 mEq/L (ref 3.5–5.1)
SODIUM: 141 meq/L (ref 136–145)
TOTAL PROTEIN: 7.7 g/dL (ref 6.4–8.3)
Total Bilirubin: 0.65 mg/dL (ref 0.20–1.20)

## 2013-08-05 NOTE — Telephone Encounter (Signed)
gv adn printed appts ched and avs for pt for DEC °

## 2013-08-05 NOTE — Progress Notes (Signed)
Hematology and Oncology Follow Up Visit  Grant Miller [id number] 09-02-1975 38 y.o. 08/05/2013 2:42 PM   Principle Diagnosis: 38 year old man with mild, chronic neutropenia diagnosed in 07/2011. This is likely a reactive and benign process.    Current therapy: Observation and follow up.   Interim History: Grant Miller presents today for a follow up visit. Since his last visit he continues to have chronic complaints of abdominal pain and constipation due to his previous operation.  He is not reporting any new complaints. He had not had any fevers, had not had any chills, had not had any recurrent sinopulmonary infection,  had not had any recent hospitalization or illnesses. He continues to make positive steps towards eating better and trying to exercise more. He has not reported any excessive weight loss or change in his activity level. He does not report any sweats or major changes in his performance status. He does not report any headaches or blurry vision or double vision. Is not reporting any chest pain cough or hemoptysis. Does not report any genitourinary complaints. Rest or view of system is unremarkable   Medications: I have reviewed the patient's current medications.   Current Outpatient Prescriptions  Medication Sig Dispense Refill  . Diphenhydramine-APAP, sleep, (TYLENOL PM EXTRA STRENGTH PO) Take by mouth.      Marland Kitchen ibuprofen (ADVIL,MOTRIN) 200 MG tablet Take 200 mg by mouth every 8 (eight) hours as needed for pain. As needed for discomfort       No current facility-administered medications for this visit.    Allergies:  Allergies  Allergen Reactions  . Iodine     Past Medical History, Surgical history, Social history, and Family History were reviewed and updated.   Physical Exam: Blood pressure 114/72, pulse 103, temperature 97.8 F (36.6 C), temperature source Oral, resp. rate 18, height 5\' 10"  (1.778 m), weight 234 lb 6.4 oz (106.323 kg), SpO2 100.00%. ECOG: 0 General  appearance: alert awake not in any distress Head: Normocephalic.  Neck: no adenopathy and thyroid not enlarged, symmetric, no tenderness/mass/nodules Lymph nodes: Cervical, supraclavicular, and axillary nodes normal. Heart:regular rate and rhythm, S1, S2 normal, no murmur, click, rub or gallop Lung:chest clear, no wheezing, rales, normal symmetric air entry Abdomin: Soft diffusely tender to touch but no rebound or guarding. He has good bowel sounds. EXT:no erythema, induration, or nodules   Lab Results: Lab Results  Component Value Date   WBC 4.7 08/05/2013   HGB 13.9 08/05/2013   HCT 42.4 08/05/2013   MCV 84.4 08/05/2013   PLT 248 08/05/2013     Chemistry      Component Value Date/Time   NA 143 02/04/2013 0746   NA 141 07/31/2011 0925   K 3.6 02/04/2013 0746   K 3.9 07/31/2011 0925   CL 107 07/31/2011 0925   CO2 24 02/04/2013 0746   CO2 26 07/31/2011 0925   BUN 12.6 02/04/2013 0746   BUN 14 07/31/2011 0925   CREATININE 1.2 02/04/2013 0746   CREATININE 1.04 07/31/2011 0925      Component Value Date/Time   CALCIUM 9.7 02/04/2013 0746   CALCIUM 9.3 07/31/2011 0925   ALKPHOS 58 02/04/2013 0746   ALKPHOS 65 07/31/2011 0925   AST 32 02/04/2013 0746   AST 23 07/31/2011 0925   ALT 33 02/04/2013 0746   ALT 26 07/31/2011 0925   BILITOT 0.41 02/04/2013 0746   BILITOT 0.5 07/31/2011 0925       Impression and Plan:  A 38 year old gentleman with the  following issues.   1. Fluctuating mild leukocytopenia. His white cell count has fluctuated between 3.5 to a normal range of about 4. His white cell count is perfectly normal today at 4.7. This is likely more of a benign etiology. However, I would like to continue to monitor him, a like to recheck his counts in about 6 months.  2. Complaint of fatigue, tiredness. It seems to be stable at this time.  3. Health maintenance issues: I have referred him for a primary care physician to establish care. I urged him to make sure that he makes his appointments and he  has not done so since the last visit.   Grant Portela, MD 6/4/20152:42 PM

## 2013-08-07 ENCOUNTER — Emergency Department (INDEPENDENT_AMBULATORY_CARE_PROVIDER_SITE_OTHER): Payer: Medicare Other

## 2013-08-07 ENCOUNTER — Encounter (HOSPITAL_COMMUNITY): Payer: Self-pay | Admitting: Emergency Medicine

## 2013-08-07 ENCOUNTER — Emergency Department (HOSPITAL_COMMUNITY)
Admission: EM | Admit: 2013-08-07 | Discharge: 2013-08-07 | Disposition: A | Discharge status: Home / Self Care | Payer: Medicare Other | Source: Home / Self Care | Attending: Family Medicine | Admitting: Family Medicine

## 2013-08-07 DIAGNOSIS — M545 Low back pain, unspecified: Secondary | ICD-10-CM

## 2013-08-07 LAB — POCT URINALYSIS DIP (DEVICE)
Bilirubin Urine: NEGATIVE
GLUCOSE, UA: NEGATIVE mg/dL
HGB URINE DIPSTICK: NEGATIVE
Leukocytes, UA: NEGATIVE
Nitrite: NEGATIVE
Protein, ur: NEGATIVE mg/dL
UROBILINOGEN UA: 0.2 mg/dL (ref 0.0–1.0)
pH: 5.5 (ref 5.0–8.0)

## 2013-08-07 MED ORDER — PREDNISONE 20 MG PO TABS
ORAL_TABLET | ORAL | Status: AC
  Filled 2013-08-07: qty 3

## 2013-08-07 MED ORDER — CYCLOBENZAPRINE HCL 10 MG PO TABS: 10.0000 mg | ORAL_TABLET | Freq: Two times a day (BID) | ORAL | Status: DC | PRN

## 2013-08-07 MED ORDER — HYDROCODONE-ACETAMINOPHEN 5-325 MG PO TABS: 1.0000 | ORAL_TABLET | ORAL | Status: DC | PRN

## 2013-08-07 MED ORDER — PREDNISONE 20 MG PO TABS: 40.0000 mg | ORAL_TABLET | Freq: Every day | ORAL | Status: DC

## 2013-08-07 MED ORDER — PREDNISONE 20 MG PO TABS
60.0000 mg | ORAL_TABLET | Freq: Once | ORAL | Status: AC
  Administered 2013-08-07: 60 mg via ORAL

## 2013-08-07 NOTE — Discharge Instructions (Signed)
Your xray today is normal. There does not appear to be any bony abnormalities. The likely source of your pain is muscle strain combined with your old injury. Read over the home care instructions and precautions below. Take the medications as directed. A heating pad may be beneficial as well. Avoid heavy lifting (over 15-20 lbs) or strenuous activity for 5-7 days. If the pain persist or worsens you will need to arrange follow up with Dr Rip Harbour for further evaluation.   Back Exercises Back exercises help treat and prevent back injuries. The goal is to increase your strength in your belly (abdominal) and back muscles. These exercises can also help with flexibility. Start these exercises when told by your doctor. HOME CARE Back exercises include: Pelvic Tilt.  Lie on your back with your knees bent. Tilt your pelvis until the lower part of your back is against the floor. Hold this position 5 to 10 sec. Repeat this exercise 5 to 10 times. Knee to Chest.  Pull 1 knee up against your chest and hold for 20 to 30 seconds. Repeat this with the other knee. This may be done with the other leg straight or bent, whichever feels better. Then, pull both knees up against your chest. Sit-Ups or Curl-Ups.  Bend your knees 90 degrees. Start with tilting your pelvis, and do a partial, slow sit-up. Only lift your upper half 30 to 45 degrees off the floor. Take at least 2 to 3 seonds for each sit-up. Do not do sit-ups with your knees out straight. If partial sit-ups are difficult, simply do the above but with only tightening your belly (abdominal) muscles and holding it as told. Hip-Lift.  Lie on your back with your knees flexed 90 degrees. Push down with your feet and shoulders as you raise your hips 2 inches off the floor. Hold for 10 seconds, repeat 5 to 10 times. Back Arches.  Lie on your stomach. Prop yourself up on bent elbows. Slowly press on your hands, causing an arch in your low back. Repeat 3 to 5  times. Shoulder-Lifts.  Lie face down with arms beside your body. Keep hips and belly pressed to floor as you slowly lift your head and shoulders off the floor. Do not overdo your exercises. Be careful in the beginning. Exercises may cause you some mild back discomfort. If the pain lasts for more than 15 minutes, stop the exercises until you see your doctor. Improvement with exercise for back problems is slow.  Document Released: 03/23/2010 Document Revised: 05/13/2011 Document Reviewed: 12/20/2010 [facility] Patient Information 2014 [place], [place].  Back Pain, Adult Back pain is very common. The pain often gets better over time. The cause of back pain is usually not dangerous. Most people can learn to manage their back pain on their own.  HOME CARE   Stay active. Start with short walks on flat ground if you can. Try to walk farther each day.  Do not sit, drive, or stand in one place for more than 30 minutes. Do not stay in bed.  Do not avoid exercise or work. Activity can help your back heal faster.  Be careful when you bend or lift an object. Bend at your knees, keep the object close to you, and do not twist.  Sleep on a firm mattress. Lie on your side, and bend your knees. If you lie on your back, put a pillow under your knees.  Only take medicines as told by your doctor.  Put ice on the injured  area.  Put ice in a plastic bag.  Place a towel between your skin and the bag.  Leave the ice on for 15-20 minutes, 03-04 times a day for the first 2 to 3 days. After that, you can switch between ice and heat packs.  Ask your doctor about back exercises or massage.  Avoid feeling anxious or stressed. Find good ways to deal with stress, such as exercise. GET HELP RIGHT AWAY IF:   Your pain does not go away with rest or medicine.  Your pain does not go away in 1 week.  You have new problems.  You do not feel well.  The pain spreads into your legs.  You cannot control when you  poop (bowel movement) or pee (urinate).  Your arms or legs feel weak or lose feeling (numbness).  You feel sick to your stomach (nauseous) or throw up (vomit).  You have belly (abdominal) pain.  You feel like you may pass out (faint). MAKE SURE YOU:   Understand these instructions.  Will watch your condition.  Will get help right away if you are not doing well or get worse. Document Released: 08/07/2007 Document Revised: 05/13/2011 Document Reviewed: 07/09/2010 [facility] Patient Information 2014 [place].

## 2013-08-07 NOTE — ED Provider Notes (Signed)
Medical screening examination/treatment/procedure(s) were performed by a resident physician or non-physician practitioner and as the supervising physician I was immediately available for consultation/collaboration.  Lynne Leader, MD    Gregor Hams, MD 08/07/13 [phone]
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
## 2013-08-07 NOTE — ED Notes (Signed)
Pt  Reports   Low  Back pain  X  2    Weeks         Off  And  On       X 2  Weeks          denys  Recent  Injury  However  Has  A  History  Of  Back problems                 He  Reports          Frequent  Urination

## 2013-08-12 ENCOUNTER — Other Ambulatory Visit: Payer: Medicare Other

## 2013-08-12 ENCOUNTER — Ambulatory Visit: Payer: Medicare Other | Admitting: Oncology

## 2013-12-27 DIAGNOSIS — H47031 Optic nerve hypoplasia, right eye: Secondary | ICD-10-CM | POA: Diagnosis not present

## 2013-12-27 DIAGNOSIS — H31091 Other chorioretinal scars, right eye: Secondary | ICD-10-CM | POA: Diagnosis not present

## 2013-12-27 DIAGNOSIS — H04123 Dry eye syndrome of bilateral lacrimal glands: Secondary | ICD-10-CM | POA: Diagnosis not present

## 2014-02-02 ENCOUNTER — Other Ambulatory Visit: Payer: Medicare Other

## 2014-02-02 ENCOUNTER — Ambulatory Visit: Payer: Medicare Other | Admitting: Oncology

## 2014-02-03 ENCOUNTER — Telehealth: Payer: Self-pay | Admitting: Oncology

## 2014-02-03 NOTE — Telephone Encounter (Signed)
Pt confirmed labs/ov per 12/03 POF, pt states our phone service called and stated he had an apt today, r/s to 12/04.... Grant Miller

## 2014-02-04 ENCOUNTER — Other Ambulatory Visit (HOSPITAL_BASED_OUTPATIENT_CLINIC_OR_DEPARTMENT_OTHER): Payer: Medicare Other

## 2014-02-04 ENCOUNTER — Ambulatory Visit (HOSPITAL_BASED_OUTPATIENT_CLINIC_OR_DEPARTMENT_OTHER): Payer: Medicare Other | Admitting: Oncology

## 2014-02-04 VITALS — BP 89/49 | HR 85 | Temp 98.1°F | Resp 18 | Wt 236.3 lb

## 2014-02-04 DIAGNOSIS — D869 Sarcoidosis, unspecified: Secondary | ICD-10-CM

## 2014-02-04 DIAGNOSIS — D72819 Decreased white blood cell count, unspecified: Secondary | ICD-10-CM | POA: Diagnosis not present

## 2014-02-04 LAB — CBC WITH DIFFERENTIAL/PLATELET
BASO%: 0.2 % (ref 0.0–2.0)
Basophils Absolute: 0 10*3/uL (ref 0.0–0.1)
EOS%: 2.5 % (ref 0.0–7.0)
Eosinophils Absolute: 0.1 10*3/uL (ref 0.0–0.5)
HEMATOCRIT: 42.6 % (ref 38.4–49.9)
HGB: 14.5 g/dL (ref 13.0–17.1)
LYMPH%: 49.6 % — ABNORMAL HIGH (ref 14.0–49.0)
MCH: 27.8 pg (ref 27.2–33.4)
MCHC: 34 g/dL (ref 32.0–36.0)
MCV: 81.6 fL (ref 79.3–98.0)
MONO#: 0.2 10*3/uL (ref 0.1–0.9)
MONO%: 5.1 % (ref 0.0–14.0)
NEUT%: 42.6 % (ref 39.0–75.0)
NEUTROS ABS: 1.9 10*3/uL (ref 1.5–6.5)
PLATELETS: 243 10*3/uL (ref 140–400)
RBC: 5.22 10*6/uL (ref 4.20–5.82)
RDW: 13.7 % (ref 11.0–14.6)
WBC: 4.5 10*3/uL (ref 4.0–10.3)
lymph#: 2.2 10*3/uL (ref 0.9–3.3)
nRBC: 0 % (ref 0–0)

## 2014-02-04 NOTE — Progress Notes (Signed)
Hematology and Oncology Follow Up Visit  Grant Miller [id number] 03-11-1975 38 y.o. 02/04/2014 10:01 AM   Principle Diagnosis: 38 year old man with mild, chronic neutropenia diagnosed in 07/2011. This is likely a reactive and benign process.   His white cell count have been relatively normal in the last year.  Current therapy: Observation and follow up.   Interim History: Grant Miller presents today for a follow up visit. Since his last visit he continues to have chronic complaints of abdominal pain and constipation due to his previous operation. He is able to eat and keep food down and have not really changed dramatically. He continues to perform activities of daily living without any decline. He does report some urinary frequency at times.  He had not had any fevers, had not had any chills, had not had any recurrent sinopulmonary infection,  had not had any recent hospitalization or illnesses. He has not reported any excessive weight loss or change in his activity level. He does not report any sweats or major changes in his performance status. He does not report any headaches or blurry vision or double vision. Is not reporting any chest pain cough or hemoptysis. Does not report any genitourinary complaints. Rest or view of system is unremarkable   Medications: I have reviewed the patient's current medications.   Current Outpatient Prescriptions  Medication Sig Dispense Refill  . Diphenhydramine-APAP, sleep, (TYLENOL PM EXTRA STRENGTH PO) Take by mouth.    Marland Kitchen HYDROcodone-acetaminophen (NORCO/VICODIN) 5-325 MG per tablet Take 1 tablet by mouth every 4 (four) hours as needed. 10 tablet 0  . ibuprofen (ADVIL,MOTRIN) 200 MG tablet Take 200 mg by mouth every 8 (eight) hours as needed for pain. As needed for discomfort     No current facility-administered medications for this visit.    Allergies:  Allergies  Allergen Reactions  . Iodine     Past Medical History, Surgical history, Social  history, and Family History were reviewed and updated.   Physical Exam: Blood pressure 89/49, pulse 85, temperature 98.1 F (36.7 C), temperature source Oral, resp. rate 18, weight 236 lb 5 oz (107.191 kg). ECOG: 0 General appearance: alert awake not in any distress Head: Normocephalic.  Neck: no adenopathy Lymph nodes: Cervical, supraclavicular, and axillary nodes normal. Heart:regular rate and rhythm, S1, S2 normal, no murmur, click, rub or gallop Lung:chest clear, no wheezing, rales, normal symmetric air entry Abdomin: Soft diffusely tender to touch but no rebound or guarding. He has good bowel sounds. EXT:no erythema, induration, or nodules   Lab Results: Lab Results  Component Value Date   WBC 4.7 08/05/2013   HGB 13.9 08/05/2013   HCT 42.4 08/05/2013   MCV 84.4 08/05/2013   PLT 248 08/05/2013     Chemistry      Component Value Date/Time   NA 141 08/05/2013 1405   NA 141 07/31/2011 0925   K 3.7 08/05/2013 1405   K 3.9 07/31/2011 0925   CL 107 07/31/2011 0925   CO2 20* 08/05/2013 1405   CO2 26 07/31/2011 0925   BUN 17.2 08/05/2013 1405   BUN 14 07/31/2011 0925   CREATININE 1.2 08/05/2013 1405   CREATININE 1.04 07/31/2011 0925      Component Value Date/Time   CALCIUM 9.6 08/05/2013 1405   CALCIUM 9.3 07/31/2011 0925   ALKPHOS 54 08/05/2013 1405   ALKPHOS 65 07/31/2011 0925   AST 31 08/05/2013 1405   AST 23 07/31/2011 0925   ALT 36 08/05/2013 1405   ALT 26  07/31/2011 0925   BILITOT 0.65 08/05/2013 1405   BILITOT 0.5 07/31/2011 0925       Impression and Plan:  A 38 year old gentleman with the following issues.   1. Fluctuating mild leukocytopenia. His white cell count has fluctuated between 3.5 to a normal range of about 4. His white cell count is pending from today but have been relatively normal recently. This is likely more of a benign etiology. I see no further need for any intervention or follow-up and I will be happy to see him as needed.  2.  Health maintenance issues/ urinary frequency: I have referred him for a primary care physician to establish care but for some reason that has not happened at this time. I will make another referral today and I emphasized the importance for him to establish care with a primary care physician regarding his other health issues.   RJJOAC,ZYSAY, MD 12/4/201510:01 AM

## 2014-03-10 ENCOUNTER — Telehealth: Payer: Self-pay | Admitting: Oncology

## 2014-03-10 NOTE — Telephone Encounter (Signed)
Pt called to sch 6 mths with MD and to get information for PCP, I gave pt [facility] [address] information and mailed out sch... KJ

## 2014-08-15 ENCOUNTER — Other Ambulatory Visit: Payer: Self-pay | Admitting: *Deleted

## 2014-08-15 DIAGNOSIS — D72819 Decreased white blood cell count, unspecified: Secondary | ICD-10-CM

## 2014-08-16 ENCOUNTER — Ambulatory Visit (HOSPITAL_BASED_OUTPATIENT_CLINIC_OR_DEPARTMENT_OTHER): Payer: Medicare Other | Admitting: Oncology

## 2014-08-16 ENCOUNTER — Other Ambulatory Visit (HOSPITAL_BASED_OUTPATIENT_CLINIC_OR_DEPARTMENT_OTHER): Payer: Medicare Other

## 2014-08-16 ENCOUNTER — Telehealth: Payer: Self-pay | Admitting: Oncology

## 2014-08-16 VITALS — BP 126/76 | HR 92 | Temp 98.0°F | Resp 18 | Ht 70.0 in | Wt 227.0 lb

## 2014-08-16 DIAGNOSIS — D72819 Decreased white blood cell count, unspecified: Secondary | ICD-10-CM

## 2014-08-16 DIAGNOSIS — R39198 Other difficulties with micturition: Secondary | ICD-10-CM

## 2014-08-16 DIAGNOSIS — R350 Frequency of micturition: Secondary | ICD-10-CM

## 2014-08-16 LAB — CBC WITH DIFFERENTIAL/PLATELET
BASO%: 0.5 % (ref 0.0–2.0)
BASOS ABS: 0 10*3/uL (ref 0.0–0.1)
EOS ABS: 0.1 10*3/uL (ref 0.0–0.5)
EOS%: 1.4 % (ref 0.0–7.0)
HCT: 41.3 % (ref 38.4–49.9)
HEMOGLOBIN: 13.6 g/dL (ref 13.0–17.1)
LYMPH%: 30.1 % (ref 14.0–49.0)
MCH: 27.7 pg (ref 27.2–33.4)
MCHC: 32.8 g/dL (ref 32.0–36.0)
MCV: 84.2 fL (ref 79.3–98.0)
MONO#: 0.3 10*3/uL (ref 0.1–0.9)
MONO%: 4.9 % (ref 0.0–14.0)
NEUT%: 63.1 % (ref 39.0–75.0)
NEUTROS ABS: 3.8 10*3/uL (ref 1.5–6.5)
Platelets: 220 10*3/uL (ref 140–400)
RBC: 4.9 10*6/uL (ref 4.20–5.82)
RDW: 13.4 % (ref 11.0–14.6)
WBC: 6.1 10*3/uL (ref 4.0–10.3)
lymph#: 1.8 10*3/uL (ref 0.9–3.3)

## 2014-08-16 LAB — COMPREHENSIVE METABOLIC PANEL (CC13)
ALBUMIN: 4.1 g/dL (ref 3.5–5.0)
ALT: 36 U/L (ref 0–55)
AST: 35 U/L — AB (ref 5–34)
Alkaline Phosphatase: 62 U/L (ref 40–150)
Anion Gap: 11 mEq/L (ref 3–11)
BUN: 19.6 mg/dL (ref 7.0–26.0)
CO2: 22 mEq/L (ref 22–29)
CREATININE: 1.2 mg/dL (ref 0.7–1.3)
Calcium: 9.2 mg/dL (ref 8.4–10.4)
Chloride: 109 mEq/L (ref 98–109)
EGFR: 87 mL/min/{1.73_m2} — ABNORMAL LOW (ref >90)
Glucose: 110 mg/dl (ref 70–140)
POTASSIUM: 3.8 meq/L (ref 3.5–5.1)
Sodium: 142 mEq/L (ref 136–145)
Total Bilirubin: 0.43 mg/dL (ref 0.20–1.20)
Total Protein: 7.3 g/dL (ref 6.4–8.3)

## 2014-08-16 NOTE — Telephone Encounter (Signed)
Gave and printed appt sched and avs fo rpt for DEC...Marland KitchenPt sched to see Dr. Karsten Ro 6.16 @ 8:15 pm ....gv pt information to call [facility] to set up primary care

## 2014-08-16 NOTE — Progress Notes (Signed)
Hematology and Oncology Follow Up Visit  Grant Miller [id number] 08/31/1975 39 y.o. 08/16/2014 8:24 AM   Principle Diagnosis: 39 year old man with mild, chronic neutropenia diagnosed in 07/2011. This is likely a reactive and benign process.   His white cell count have been relatively normal recently.  Current therapy: Observation and follow up.   Interim History: Grant Miller presents today for a follow up visit. Since his last visit, he is a relatively fair. He continues to have chronic complaints of abdominal pain and constipation due to his previous operation. He is able to eat and keep food down and have not really changed dramatically. He continues to perform activities of daily living without any decline. He continues to have lower urinary tract symptoms including frequency and nocturia. He does report lower back pain which is chronic and have not changed recently. He had not had any fevers, had not had any chills, had not had any recurrent sinopulmonary infection. He has not reported any excessive weight loss or change in his activity level. He does not report any sweats or major changes in his performance status. He does not report any headaches or blurry vision or double vision. Is not reporting any chest pain cough or hemoptysis. Does not report any genitourinary complaints. Rest or view of system is unremarkable   Medications: I have reviewed the patient's current medications.   Current Outpatient Prescriptions  Medication Sig Dispense Refill  . Diphenhydramine-APAP, sleep, (TYLENOL PM EXTRA STRENGTH PO) Take by mouth.    Marland Kitchen HYDROcodone-acetaminophen (NORCO/VICODIN) 5-325 MG per tablet Take 1 tablet by mouth every 4 (four) hours as needed. 10 tablet 0  . ibuprofen (ADVIL,MOTRIN) 200 MG tablet Take 200 mg by mouth every 8 (eight) hours as needed for pain. As needed for discomfort     No current facility-administered medications for this visit.    Allergies:  Allergies  Allergen  Reactions  . Iodine     Past Medical History, Surgical history, Social history, and Family History were reviewed and updated.   Physical Exam: Blood pressure 126/76, pulse 92, temperature 98 F (36.7 C), temperature source Oral, resp. rate 18, height 5\' 10"  (1.778 m), weight 227 lb (102.967 kg), SpO2 100 %. ECOG: 0 General appearance: alert awake well-appearing gentleman. Head: Normocephalic.  Neck: no adenopathy Lymph nodes: Cervical, supraclavicular, and axillary nodes normal. Heart:regular rate and rhythm, S1, S2 normal, no murmur, click, rub or gallop Lung:chest clear, no wheezing, rales, normal symmetric air entry Abdomin: Soft diffusely tender to touch but no rebound or guarding. He has good bowel sounds. EXT:no erythema, induration, or nodules   Lab Results: Lab Results  Component Value Date   WBC 6.1 08/16/2014   HGB 13.6 08/16/2014   HCT 41.3 08/16/2014   MCV 84.2 08/16/2014   PLT 220 08/16/2014     Chemistry      Component Value Date/Time   NA 141 08/05/2013 1405   NA 141 07/31/2011 0925   K 3.7 08/05/2013 1405   K 3.9 07/31/2011 0925   CL 107 07/31/2011 0925   CO2 20* 08/05/2013 1405   CO2 26 07/31/2011 0925   BUN 17.2 08/05/2013 1405   BUN 14 07/31/2011 0925   CREATININE 1.2 08/05/2013 1405   CREATININE 1.04 07/31/2011 0925      Component Value Date/Time   CALCIUM 9.6 08/05/2013 1405   CALCIUM 9.3 07/31/2011 0925   ALKPHOS 54 08/05/2013 1405   ALKPHOS 65 07/31/2011 0925   AST 31 08/05/2013 1405  AST 23 07/31/2011 0925   ALT 36 08/05/2013 1405   ALT 26 07/31/2011 0925   BILITOT 0.65 08/05/2013 1405   BILITOT 0.5 07/31/2011 0925       Impression and Plan:  A 39 year old gentleman with the following issues.   1. Fluctuating mild leukocytopenia. His white cell count has fluctuated between 3.5 to a normal range of about 4. His white cell count is normal today and I see no sign of any blood disorder.  2. Health maintenance issues/ urinary  frequency: I have referred him for a primary care physician to establish care but for some reason that has not happened at this time. I will make another referral today to primary care as well as urology for evaluation of his urinary symptoms.   [facility], MD 6/14/20168:24 AM

## 2014-09-23 ENCOUNTER — Emergency Department (HOSPITAL_COMMUNITY)
Admission: EM | Admit: 2014-09-23 | Discharge: 2014-09-23 | Disposition: A | Discharge status: Home / Self Care | Payer: Medicare Other | Attending: Emergency Medicine | Admitting: Emergency Medicine

## 2014-09-23 ENCOUNTER — Encounter (HOSPITAL_COMMUNITY): Payer: Self-pay

## 2014-09-23 DIAGNOSIS — L089 Local infection of the skin and subcutaneous tissue, unspecified: Secondary | ICD-10-CM | POA: Diagnosis not present

## 2014-09-23 DIAGNOSIS — S90561A Insect bite (nonvenomous), right ankle, initial encounter: Secondary | ICD-10-CM | POA: Diagnosis present

## 2014-09-23 DIAGNOSIS — W57XXXA Bitten or stung by nonvenomous insect and other nonvenomous arthropods, initial encounter: Secondary | ICD-10-CM | POA: Diagnosis not present

## 2014-09-23 DIAGNOSIS — Y929 Unspecified place or not applicable: Secondary | ICD-10-CM | POA: Diagnosis not present

## 2014-09-23 DIAGNOSIS — Y999 Unspecified external cause status: Secondary | ICD-10-CM | POA: Diagnosis not present

## 2014-09-23 DIAGNOSIS — Y939 Activity, unspecified: Secondary | ICD-10-CM | POA: Diagnosis not present

## 2014-09-23 MED ORDER — PREDNISONE 20 MG PO TABS: 40.0000 mg | ORAL_TABLET | Freq: Every day | ORAL | Status: DC

## 2014-09-23 MED ORDER — PREDNISONE 20 MG PO TABS
60.0000 mg | ORAL_TABLET | Freq: Once | ORAL | Status: AC
  Administered 2014-09-23: 60 mg via ORAL
  Filled 2014-09-23: qty 3

## 2014-09-23 MED ORDER — CEPHALEXIN 500 MG PO CAPS: 500.0000 mg | ORAL_CAPSULE | Freq: Four times a day (QID) | ORAL | Status: DC

## 2014-09-23 MED ORDER — HYDROXYZINE HCL 25 MG PO TABS: 25.0000 mg | ORAL_TABLET | Freq: Four times a day (QID) | ORAL | Status: DC

## 2014-09-23 MED ORDER — HYDROXYZINE HCL 25 MG PO TABS
25.0000 mg | ORAL_TABLET | Freq: Once | ORAL | Status: AC
  Administered 2014-09-23: 25 mg via ORAL
  Filled 2014-09-23: qty 1

## 2014-09-23 NOTE — ED Notes (Signed)
Patient reports he felt something bit his RLE on Tuesday.  He now c/o redness, itching, and pain to area.  Reports he has been using Benadryl at home to manage itching.

## 2014-09-23 NOTE — ED Provider Notes (Signed)
CSN: [id number]     Arrival date & time 09/23/14  0344 History   First MD Initiated Contact with Patient 09/23/14 0403     Chief Complaint  Patient presents with  . Insect Bite    (Consider location/radiation/quality/duration/timing/severity/associated sxs/prior Treatment) HPI Comments: Patient is a 39 year old male who presents to the emergency department for further evaluation of a bug bite to his right lower extremity. Patient states that he felt something hit him 2 days ago. He has had some worsening redness and itching to the area since this time. Symptoms are constant. Patient has been taking Benadryl without relief of symptoms. He believes that the redness is spreading. He denies any numbness or tingling to his extremities. No fever. He has had no drainage from the bite site. Patient cannot recall the date of his last tetanus shot. No other medications taken prior to arrival for symptoms.  The history is provided by the patient. No language interpreter was used.    History reviewed. No pertinent past medical history. Past Surgical History  Procedure Laterality Date  . Abdominal surgery     No family history on file. History  Substance Use Topics  . Smoking status: Never Smoker   . Smokeless tobacco: Not on file  . Alcohol Use: No    Review of Systems  Skin: Positive for color change and rash.  All other systems reviewed and are negative.   Allergies  Iodine  Home Medications   Prior to Admission medications   Medication Sig Start Date End Date Taking? Authorizing Provider  cephALEXin (KEFLEX) 500 MG capsule Take 1 capsule (500 mg total) by mouth 4 (four) times daily. 09/23/14   Antonietta Breach, PA-C  Diphenhydramine-APAP, sleep, (TYLENOL PM EXTRA STRENGTH PO) Take by mouth.    Historical Provider, MD  HYDROcodone-acetaminophen (NORCO/VICODIN) 5-325 MG per tablet Take 1 tablet by mouth every 4 (four) hours as needed. 08/07/13   Rhetta Mura Schorr, NP  hydrOXYzine  (ATARAX/VISTARIL) 25 MG tablet Take 1 tablet (25 mg total) by mouth every 6 (six) hours. 09/23/14   Antonietta Breach, PA-C  ibuprofen (ADVIL,MOTRIN) 200 MG tablet Take 200 mg by mouth every 8 (eight) hours as needed for pain. As needed for discomfort    Historical Provider, MD  predniSONE (DELTASONE) 20 MG tablet Take 2 tablets (40 mg total) by mouth daily. Take 40 mg by mouth daily for 3 days, then 20mg  by mouth daily for 3 days, then 10mg  daily for 3 days 09/23/14   Antonietta Breach, PA-C   BP 142/81 mmHg  Pulse 72  Temp(Src) 98.2 F (36.8 C) (Oral)  Resp 18  Ht 5\' 11"  (1.803 m)  Wt 229 lb (103.874 kg)  BMI 31.95 kg/m2  SpO2 100%   Physical Exam  Constitutional: He is oriented to person, place, and time. He appears well-developed and well-nourished. No distress.  Nontoxic/nonseptic appearing  HENT:  Head: Normocephalic and atraumatic.  Eyes: Conjunctivae and EOM are normal. No scleral icterus.  Neck: Normal range of motion.  Cardiovascular: Normal rate, regular rhythm and intact distal pulses.   DP and PT pulses 2+ in the right lower extremity  Pulmonary/Chest: Effort normal. No respiratory distress.  Respirations even and unlabored  Musculoskeletal: Normal range of motion.  Neurological: He is alert and oriented to person, place, and time. He exhibits normal muscle tone. Coordination normal.  Sensation to light touch intact. Patient ambulatory with steady gait  Skin: Skin is warm and dry. No rash noted. He is not diaphoretic. There  is erythema. No pallor.  2 macules appreciated to the medial right lower extremity, just above the medial malleolus. Macules are flushed with the skin, erythematous, pruritic, and blanching. No red linear streaking or heat to touch. No induration.  Psychiatric: He has a normal mood and affect. His behavior is normal.  Nursing note and vitals reviewed.   ED Course  Procedures (including critical care time) Labs Review Labs Reviewed - No data to  display  Imaging Review No results found.   EKG Interpretation None      MDM   Final diagnoses:  Insect bite of ankle with local reaction, right, initial encounter    39 year old male percent to the emergency department for further evaluation of an itching bug bite which occurred 2 days ago. Patient is neurovascularly intact. No evidence of secondary infection. Will cover for infection with a short course of Keflex. Patient given prescription for prednisone taper and Atarax for itching. Supportive treatment advised with Benadryl as well.   Patient recommended to receive a tetanus shot in the emergency department. Patient declines a tetanus shot at this time, stating that he will see his primary care doctor for this. Patient verbalizes understanding of the risks of not receiving a tetanus shot in the emergency department including poor or adverse outcomes including, but not limited to, death. He has the capacity to make this decision. Return precautions discussed and provided. Patient discharged in good condition with no unaddressed concerns.   Filed Vitals:   09/23/14 0347  BP: 142/81  Pulse: 72  Temp: 98.2 F (36.8 C)  TempSrc: Oral  Resp: 18  Height: 5\' 11"  (1.803 m)  Weight: 229 lb (103.874 kg)  SpO2: 100%      Antonietta Breach, PA-C 09/23/14 [place], DO 09/23/14 1856

## 2014-09-23 NOTE — Discharge Instructions (Signed)
Take prednisone and Atarax as prescribed for symptoms. Take Keflex to prevent infection. You may continue to take Benadryl as needed for itching. Follow up with your primary care doctor for further evaluation of symptoms.  Insect Bite Mosquitoes, flies, fleas, bedbugs, and many other insects can bite. Insect bites are different from insect stings. A sting is when venom is injected into the skin. Some insect bites can transmit infectious diseases. SYMPTOMS  Insect bites usually turn red, swell, and itch for 2 to 4 days. They often go away on their own. TREATMENT  Your caregiver may prescribe antibiotic medicines if a bacterial infection develops in the bite. HOME CARE INSTRUCTIONS  Do not scratch the bite area.  Keep the bite area clean and dry. Wash the bite area thoroughly with soap and water.  Put ice or cool compresses on the bite area.  Put ice in a plastic bag.  Place a towel between your skin and the bag.  Leave the ice on for 20 minutes, 4 times a day for the first 2 to 3 days, or as directed.  You may apply a baking soda paste, cortisone cream, or calamine lotion to the bite area as directed by your caregiver. This can help reduce itching and swelling.  Only take over-the-counter or prescription medicines as directed by your caregiver.  If you are given antibiotics, take them as directed. Finish them even if you start to feel better. You may need a tetanus shot if:  You cannot remember when you had your last tetanus shot.  You have never had a tetanus shot.  The injury broke your skin. If you get a tetanus shot, your arm may swell, get red, and feel warm to the touch. This is common and not a problem. If you need a tetanus shot and you choose not to have one, there is a rare chance of getting tetanus. Sickness from tetanus can be serious. SEEK IMMEDIATE MEDICAL CARE IF:   You have increased pain, redness, or swelling in the bite area.  You see a red line on the skin  coming from the bite.  You have a fever.  You have joint pain.  You have a headache or neck pain.  You have unusual weakness.  You have a rash.  You have chest pain or shortness of breath.  You have abdominal pain, nausea, or vomiting.  You feel unusually tired or sleepy. MAKE SURE YOU:   Understand these instructions.  Will watch your condition.  Will get help right away if you are not doing well or get worse. Document Released: 03/28/2004 Document Revised: 05/13/2011 Document Reviewed: 09/19/2010 [facility] Patient Information 2015 [place], [place]. This information is not intended to replace advice given to you by your health care provider. Make sure you discuss any questions you have with your health care provider.

## 2014-09-28 ENCOUNTER — Telehealth: Payer: Self-pay | Admitting: Internal Medicine

## 2014-09-28 ENCOUNTER — Other Ambulatory Visit (INDEPENDENT_AMBULATORY_CARE_PROVIDER_SITE_OTHER): Payer: Medicare Other

## 2014-09-28 ENCOUNTER — Encounter: Payer: Self-pay | Admitting: Internal Medicine

## 2014-09-28 ENCOUNTER — Ambulatory Visit (INDEPENDENT_AMBULATORY_CARE_PROVIDER_SITE_OTHER): Payer: Medicare Other | Admitting: Internal Medicine

## 2014-09-28 VITALS — BP 122/79 | HR 69 | Temp 98.3°F | Resp 14 | Ht 71.0 in | Wt 226.4 lb

## 2014-09-28 DIAGNOSIS — R5383 Other fatigue: Secondary | ICD-10-CM

## 2014-09-28 DIAGNOSIS — D649 Anemia, unspecified: Secondary | ICD-10-CM | POA: Diagnosis not present

## 2014-09-28 DIAGNOSIS — Z9889 Other specified postprocedural states: Secondary | ICD-10-CM

## 2014-09-28 DIAGNOSIS — D531 Other megaloblastic anemias, not elsewhere classified: Secondary | ICD-10-CM | POA: Diagnosis not present

## 2014-09-28 DIAGNOSIS — E669 Obesity, unspecified: Secondary | ICD-10-CM | POA: Diagnosis not present

## 2014-09-28 DIAGNOSIS — R7301 Impaired fasting glucose: Secondary | ICD-10-CM | POA: Diagnosis not present

## 2014-09-28 DIAGNOSIS — D869 Sarcoidosis, unspecified: Secondary | ICD-10-CM

## 2014-09-28 DIAGNOSIS — Z13220 Encounter for screening for lipoid disorders: Secondary | ICD-10-CM | POA: Diagnosis not present

## 2014-09-28 DIAGNOSIS — Z79899 Other long term (current) drug therapy: Secondary | ICD-10-CM | POA: Diagnosis not present

## 2014-09-28 LAB — LIPID PANEL
CHOLESTEROL: 180 mg/dL (ref 0–200)
HDL: 35.1 mg/dL — AB (ref >39.00)
LDL Cholesterol: 123 mg/dL — ABNORMAL HIGH (ref 0–99)
NonHDL: 144.9
TRIGLYCERIDES: 111 mg/dL (ref 0.0–149.0)
Total CHOL/HDL Ratio: 5
VLDL: 22.2 mg/dL (ref 0.0–40.0)

## 2014-09-28 LAB — HEMOGLOBIN A1C: HEMOGLOBIN A1C: 6 % (ref 4.6–6.5)

## 2014-09-28 LAB — VITAMIN B12: Vitamin B-12: 867 pg/mL (ref 211–911)

## 2014-09-28 LAB — TSH: TSH: 0.86 u[IU]/mL (ref 0.35–4.50)

## 2014-09-28 MED ORDER — PREGABALIN 100 MG PO CAPS: 100.0000 mg | ORAL_CAPSULE | Freq: Two times a day (BID) | ORAL | Status: DC

## 2014-09-28 NOTE — Assessment & Plan Note (Signed)
Unclear details, scar large midline stomach. Will refer to surgery to see if that small spot draining pus is fistulous. Unclear why he has so much residual pain. Will not rx narcotics on first visit. Reviewed [place] narcotic database and has only gotten 10 hydrocodone in the last 1.5 years legally. Checking UDS today. He claims only tylenol and ibuprofen. Will try lyrica 100 mg BID for nerve pain and nerve damage.

## 2014-09-28 NOTE — Patient Instructions (Signed)
We will try a good pain medicine called lyrica for your pain. You can take 1 pill twice a day for the pain. This is especially good for nerve damage pain from the surgery and the back injury and should be very effective.   If you have any problems with it please call us.   Come back in 2 months so we can check on how you are doing.   We will get you in to see the surgeon so they can check on the stomach, if you want they can also check on that lipoma on your left hip.
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
## 2014-09-28 NOTE — Progress Notes (Signed)
Pre visit review using our clinic review tool, if applicable. No additional management support is needed unless otherwise documented below in the visit note. 

## 2014-09-28 NOTE — Assessment & Plan Note (Signed)
He doesn't really claim this history although he does admit he was told this. He denies any breathing problems at this time.

## 2014-09-28 NOTE — Telephone Encounter (Signed)
Pt has [place] access referral will need to come from office/doctor listed on card.

## 2014-09-28 NOTE — Assessment & Plan Note (Signed)
Checking lipid panel, HgA1c, TSH, B12 today. Not complicated by hypertension.

## 2014-09-28 NOTE — Telephone Encounter (Signed)
Can you call and explain that to him? His health literacy is fairly low.

## 2014-10-06 DIAGNOSIS — R350 Frequency of micturition: Secondary | ICD-10-CM | POA: Diagnosis not present

## 2014-10-06 DIAGNOSIS — R351 Nocturia: Secondary | ICD-10-CM | POA: Diagnosis not present

## 2014-10-06 DIAGNOSIS — Z125 Encounter for screening for malignant neoplasm of prostate: Secondary | ICD-10-CM | POA: Diagnosis not present

## 2014-10-06 NOTE — Telephone Encounter (Signed)
Patient is requesting call back after 12:30.  Can reach at [phone]

## 2014-11-25 DIAGNOSIS — Z0000 Encounter for general adult medical examination without abnormal findings: Secondary | ICD-10-CM | POA: Diagnosis not present

## 2014-11-25 DIAGNOSIS — R1084 Generalized abdominal pain: Secondary | ICD-10-CM | POA: Diagnosis not present

## 2014-11-30 DIAGNOSIS — R1084 Generalized abdominal pain: Secondary | ICD-10-CM | POA: Diagnosis not present

## 2015-01-19 DIAGNOSIS — L928 Other granulomatous disorders of the skin and subcutaneous tissue: Secondary | ICD-10-CM | POA: Diagnosis not present

## 2015-02-06 DIAGNOSIS — T8189XA Other complications of procedures, not elsewhere classified, initial encounter: Secondary | ICD-10-CM | POA: Insufficient documentation

## 2015-02-08 DIAGNOSIS — L928 Other granulomatous disorders of the skin and subcutaneous tissue: Secondary | ICD-10-CM | POA: Diagnosis not present

## 2015-02-08 DIAGNOSIS — L923 Foreign body granuloma of the skin and subcutaneous tissue: Secondary | ICD-10-CM | POA: Diagnosis not present

## 2015-02-08 DIAGNOSIS — T8189XA Other complications of procedures, not elsewhere classified, initial encounter: Secondary | ICD-10-CM | POA: Diagnosis not present

## 2015-02-08 DIAGNOSIS — S31109A Unspecified open wound of abdominal wall, unspecified quadrant without penetration into peritoneal cavity, initial encounter: Secondary | ICD-10-CM | POA: Diagnosis not present

## 2015-02-08 DIAGNOSIS — Z1889 Other specified retained foreign body fragments: Secondary | ICD-10-CM | POA: Diagnosis not present

## 2015-02-08 DIAGNOSIS — R001 Bradycardia, unspecified: Secondary | ICD-10-CM | POA: Diagnosis not present

## 2015-02-08 DIAGNOSIS — L98499 Non-pressure chronic ulcer of skin of other sites with unspecified severity: Secondary | ICD-10-CM | POA: Diagnosis not present

## 2015-02-14 ENCOUNTER — Other Ambulatory Visit: Payer: Medicare Other

## 2015-02-14 ENCOUNTER — Ambulatory Visit: Payer: Medicare Other | Admitting: Oncology

## 2015-02-15 ENCOUNTER — Telehealth: Payer: Self-pay | Admitting: Oncology

## 2015-02-15 ENCOUNTER — Ambulatory Visit (HOSPITAL_BASED_OUTPATIENT_CLINIC_OR_DEPARTMENT_OTHER): Payer: Medicare Other | Admitting: Oncology

## 2015-02-15 ENCOUNTER — Other Ambulatory Visit (HOSPITAL_BASED_OUTPATIENT_CLINIC_OR_DEPARTMENT_OTHER): Payer: Medicare Other

## 2015-02-15 VITALS — BP 130/78 | HR 103 | Temp 98.2°F | Resp 16 | Ht 71.0 in | Wt 233.2 lb

## 2015-02-15 DIAGNOSIS — R39198 Other difficulties with micturition: Secondary | ICD-10-CM

## 2015-02-15 DIAGNOSIS — D72819 Decreased white blood cell count, unspecified: Secondary | ICD-10-CM

## 2015-02-15 DIAGNOSIS — D869 Sarcoidosis, unspecified: Secondary | ICD-10-CM

## 2015-02-15 LAB — CBC WITH DIFFERENTIAL/PLATELET
BASO%: 0.7 % (ref 0.0–2.0)
BASOS ABS: 0 10*3/uL (ref 0.0–0.1)
EOS ABS: 0.1 10*3/uL (ref 0.0–0.5)
EOS%: 2 % (ref 0.0–7.0)
HEMATOCRIT: 44.6 % (ref 38.4–49.9)
HEMOGLOBIN: 14.5 g/dL (ref 13.0–17.1)
LYMPH%: 39.5 % (ref 14.0–49.0)
MCH: 27.5 pg (ref 27.2–33.4)
MCHC: 32.5 g/dL (ref 32.0–36.0)
MCV: 84.6 fL (ref 79.3–98.0)
MONO#: 0.4 10*3/uL (ref 0.1–0.9)
MONO%: 7.8 % (ref 0.0–14.0)
NEUT#: 2.7 10*3/uL (ref 1.5–6.5)
NEUT%: 50 % (ref 39.0–75.0)
Platelets: 265 10*3/uL (ref 140–400)
RBC: 5.27 10*6/uL (ref 4.20–5.82)
RDW: 13.7 % (ref 11.0–14.6)
WBC: 5.5 10*3/uL (ref 4.0–10.3)
lymph#: 2.2 10*3/uL (ref 0.9–3.3)

## 2015-02-15 NOTE — Telephone Encounter (Signed)
per pof to sch pt appt-gave pt copy of avs °

## 2015-02-15 NOTE — Progress Notes (Signed)
Hematology and Oncology Follow Up Visit  Grant Miller [id number] April 21, 1975 39 y.o. 02/15/2015 8:40 AM   Principle Diagnosis: 39 year old man with mild, chronic neutropenia diagnosed in 07/2011. This is likely a reactive and benign process.   His white cell count have been relatively normal recently.  Current therapy: Observation and follow up.   Interim History: Grant Miller presents today for a follow up visit. Since his last visit, he established care with a primary care provider and had a routine physical. He also saw a Gen. surgery at [facility] and had surgery done on 02/08/2015. The details of surgery is not available to me that appears to be recovering well. He is abdominal pain is still there but predominantly related to his surgery.    He continues to perform activities of daily living without any decline that he had surgery done. Has not reported any other hospitalization or illnesses.  He had not had any fevers, had not had any chills, had not had any recurrent sinopulmonary infection. He does not report any headaches or blurry vision or double vision. Is not reporting any chest pain cough or hemoptysis. Does not report any genitourinary complaints. Rest or view of system is unremarkable   Medications: I have reviewed the patient's current medications.   Current Outpatient Prescriptions  Medication Sig Dispense Refill  . acetaminophen (TYLENOL) 500 MG tablet Take 500 mg by mouth every 6 (six) hours as needed.    . Diphenhydramine-APAP, sleep, (TYLENOL PM EXTRA STRENGTH PO) Take by mouth.    Marland Kitchen ibuprofen (ADVIL,MOTRIN) 200 MG tablet Take 200 mg by mouth every 8 (eight) hours as needed for pain. As needed for discomfort    . pregabalin (LYRICA) 100 MG capsule Take 1 capsule (100 mg total) by mouth 2 (two) times daily. 60 capsule 1  . traMADol (ULTRAM) 50 MG tablet Take 50 mg by mouth.    Marland Kitchen HYDROcodone-acetaminophen (NORCO/VICODIN) 5-325 MG per tablet Take 1 tablet by mouth  every 4 (four) hours as needed. (Patient not taking: Reported on 09/28/2014) 10 tablet 0   No current facility-administered medications for this visit.    Allergies:  Allergies  Allergen Reactions  . Iodine Hives and Shortness Of Breath    Past Medical History, Surgical history, Social history, and Family History were reviewed and updated.   Physical Exam: Blood pressure 130/78, pulse 103, temperature 98.2 F (36.8 C), temperature source Oral, resp. rate 16, height _0  (1.803 m), weight 233 lb 3.2 oz (105.779 kg), SpO2 99 %. ECOG: 0 General appearance: alert well-appearing gentleman. Without distress. Head: Normocephalic. Oral mucosa without ulcers or lesions. Neck: no adenopathy Lymph nodes: Cervical, supraclavicular, and axillary nodes normal. Heart:regular rate and rhythm, S1, S2 normal, no murmur, click, rub or gallop Lung:chest clear, no wheezing, rales, normal symmetric air entry Abdomin: Soft diffusely tender to touch but no rebound or guarding. Incision noted without any erythema or drainage. EXT:no erythema, induration, or nodules   Lab Results: Lab Results  Component Value Date   WBC 5.5 02/15/2015   HGB 14.5 02/15/2015   HCT 44.6 02/15/2015   MCV 84.6 02/15/2015   PLT 265 02/15/2015     Chemistry      Component Value Date/Time   NA 142 08/16/2014 0759   NA 141 07/31/2011 0925   K 3.8 08/16/2014 0759   K 3.9 07/31/2011 0925   CL 107 07/31/2011 0925   CO2 22 08/16/2014 0759   CO2 26 07/31/2011 0925   BUN  19.6 08/16/2014 0759   BUN 14 07/31/2011 0925   CREATININE 1.2 08/16/2014 0759   CREATININE 1.04 07/31/2011 0925      Component Value Date/Time   CALCIUM 9.2 08/16/2014 0759   CALCIUM 9.3 07/31/2011 0925   ALKPHOS 62 08/16/2014 0759   ALKPHOS 65 07/31/2011 0925   AST 35* 08/16/2014 0759   AST 23 07/31/2011 0925   ALT 36 08/16/2014 0759   ALT 26 07/31/2011 0925   BILITOT 0.43 08/16/2014 0759   BILITOT 0.5 07/31/2011 0925       Impression  and Plan:   39 year old gentleman with the following issues.   1. Fluctuating mild leukocytopenia. His white cell count has fluctuated between 3.5 to a normal range of about 4. His white cell count is normal today and I see no sign of any blood disorder. I see no need for any further investigation including bone marrow biopsy or imaging studies. Periodic monitoring will be reasonable. We'll repeat his white cell count and 7 months.  2. Health maintenance issues. He has established care with a primary care provider.  3. Abdominal surgery: Seems to be recovering well he has a follow-up this week with surgery.   [facility], MD 12/14/20168:40 AM

## 2015-06-15 ENCOUNTER — Emergency Department (HOSPITAL_COMMUNITY)
Admission: EM | Admit: 2015-06-15 | Discharge: 2015-06-15 | Disposition: A | Discharge status: Home / Self Care | Payer: Medicare Other | Attending: Emergency Medicine | Admitting: Emergency Medicine

## 2015-06-15 ENCOUNTER — Encounter (HOSPITAL_COMMUNITY): Payer: Self-pay | Admitting: Emergency Medicine

## 2015-06-15 DIAGNOSIS — Z8659 Personal history of other mental and behavioral disorders: Secondary | ICD-10-CM | POA: Insufficient documentation

## 2015-06-15 DIAGNOSIS — Y990 Civilian activity done for income or pay: Secondary | ICD-10-CM | POA: Insufficient documentation

## 2015-06-15 DIAGNOSIS — X500XXA Overexertion from strenuous movement or load, initial encounter: Secondary | ICD-10-CM | POA: Insufficient documentation

## 2015-06-15 DIAGNOSIS — Z79899 Other long term (current) drug therapy: Secondary | ICD-10-CM | POA: Insufficient documentation

## 2015-06-15 DIAGNOSIS — Y9289 Other specified places as the place of occurrence of the external cause: Secondary | ICD-10-CM | POA: Insufficient documentation

## 2015-06-15 DIAGNOSIS — Y9389 Activity, other specified: Secondary | ICD-10-CM | POA: Insufficient documentation

## 2015-06-15 DIAGNOSIS — S3992XA Unspecified injury of lower back, initial encounter: Secondary | ICD-10-CM | POA: Diagnosis present

## 2015-06-15 DIAGNOSIS — S39012A Strain of muscle, fascia and tendon of lower back, initial encounter: Secondary | ICD-10-CM | POA: Insufficient documentation

## 2015-06-15 MED ORDER — KETOROLAC TROMETHAMINE 60 MG/2ML IM SOLN
60.0000 mg | Freq: Once | INTRAMUSCULAR | Status: AC
  Administered 2015-06-15: 60 mg via INTRAMUSCULAR
  Filled 2015-06-15: qty 2

## 2015-06-15 MED ORDER — METHOCARBAMOL 500 MG PO TABS: 500.0000 mg | ORAL_TABLET | Freq: Once | ORAL | Status: DC

## 2015-06-15 MED ORDER — HYDROCODONE-ACETAMINOPHEN 5-325 MG PO TABS: 1.0000 | ORAL_TABLET | ORAL | Status: DC | PRN

## 2015-06-15 MED ORDER — PREDNISONE 20 MG PO TABS: 40.0000 mg | ORAL_TABLET | Freq: Every day | ORAL | Status: DC

## 2015-06-15 MED ORDER — METHOCARBAMOL 500 MG PO TABS: 500.0000 mg | ORAL_TABLET | Freq: Two times a day (BID) | ORAL | Status: DC

## 2015-06-15 NOTE — Discharge Instructions (Signed)

## 2015-06-15 NOTE — ED Notes (Signed)
Pt c/o lower back pain since lifting a heavy sand bag at work today. Pt sts he has a hx of back problems. A&Ox4 and ambulatory. Denies numbness and tingling.

## 2015-06-15 NOTE — ED Provider Notes (Signed)
CSN: [id number]     Arrival date & time 06/15/15  2210 History  By signing my name below, I, Grant Miller, attest that this documentation has been prepared under the direction and in the presence of [employer], PA-C.  Electronically Signed: Nicole Miller, ED Scribe 06/15/2015 at 11:30 PM.  Chief Complaint  Patient presents with  . Back Pain    The history is provided by the patient. No language interpreter was used.   HPI Comments: Grant Miller is a 40 y.o. male who presents to the Emergency Department complaining of gradual onset, constant, sharp, left lower back pain, onset earlier today. Pt reports the pain began when he was lifting a heavy object. The pain radiates up his back. It does not radiate into his legs. No other associated symptoms noted. Pt took aleve PTA with no relief to symptoms. No other worsening or alleviating factors noted. Pt denies urinary incontinence, bowel incontinence, difficulty ambulating, numbness, weakness, tingling, or any other pertinent symptoms.   Past Medical History  Diagnosis Date  . Depression    Past Surgical History  Procedure Laterality Date  . Abdominal surgery     Family History  Problem Relation Age of Onset  . Heart disease Mother   . Hyperlipidemia Mother   . Kidney disease Mother   . Diabetes Mother    Social History  Substance Use Topics  . Smoking status: Never Smoker   . Smokeless tobacco: None  . Alcohol Use: No    Review of Systems  Musculoskeletal: Positive for back pain.  Neurological: Negative for weakness and numbness.  All other systems reviewed and are negative.   Allergies  Iodine  Home Medications   Prior to Admission medications   Medication Sig Start Date End Date Taking? Authorizing Provider  acetaminophen (TYLENOL) 500 MG tablet Take 500 mg by mouth every 6 (six) hours as needed.    Historical Provider, MD  Diphenhydramine-APAP, sleep, (TYLENOL PM EXTRA STRENGTH PO) Take by mouth.     Historical Provider, MD  HYDROcodone-acetaminophen (NORCO/VICODIN) 5-325 MG tablet Take 1 tablet by mouth every 4 (four) hours as needed. 06/15/15   Antonietta Breach, PA-C  ibuprofen (ADVIL,MOTRIN) 200 MG tablet Take 200 mg by mouth every 8 (eight) hours as needed for pain. As needed for discomfort    Historical Provider, MD  methocarbamol (ROBAXIN) 500 MG tablet Take 1 tablet (500 mg total) by mouth 2 (two) times daily. 06/15/15   Antonietta Breach, PA-C  predniSONE (DELTASONE) 20 MG tablet Take 2 tablets (40 mg total) by mouth daily. 06/15/15   Antonietta Breach, PA-C  pregabalin (LYRICA) 100 MG capsule Take 1 capsule (100 mg total) by mouth 2 (two) times daily. 09/28/14   Hoyt Koch, MD   BP 125/75 mmHg  Pulse 68  Temp(Src) 98.6 F (37 C) (Oral)  Resp 16  SpO2 96%  Physical Exam  Constitutional: He is oriented to person, place, and time. He appears well-developed and well-nourished. No distress.  Nontoxic/nonseptic appearing  HENT:  Head: Normocephalic and atraumatic.  Eyes: Conjunctivae and EOM are normal. No scleral icterus.  Neck: Normal range of motion.  Cardiovascular: Normal rate, regular rhythm and intact distal pulses.   DP and PT pulses 2+ b/l  Pulmonary/Chest: Effort normal. No respiratory distress.  Respirations even and unlabored  Musculoskeletal: Normal range of motion. He exhibits tenderness.       Thoracic back: Normal.       Lumbar back: He exhibits tenderness and pain. He exhibits no  swelling, no deformity and no spasm.       Back:  Positive straight leg raise and crossed straight leg raise.  Neurological: He is alert and oriented to person, place, and time. He exhibits normal muscle tone. Coordination normal.  Patient moving all extremities. He is ambulatory with steady gait. Sensation to light touch intact.  Skin: Skin is warm and dry. No rash noted. He is not diaphoretic. No erythema. No pallor.  Psychiatric: He has a normal mood and affect. His behavior is normal.   Nursing note and vitals reviewed.   ED Course  Procedures (including critical care time) DIAGNOSTIC STUDIES: Oxygen Saturation is 96% on RA, adequate by my interpretation.    COORDINATION OF CARE: 10:51 PM-Discussed treatment plan which includes toradol and robaxin with pt at bedside and pt agreed to plan.   Labs Review Labs Reviewed - No data to display  Imaging Review No results found.    EKG Interpretation None      MDM   Final diagnoses:  Lumbosacral strain, initial encounter    Patient with back pain. Hx of same. He reports worsening after lifting pallets of water. Patient neurovascularly intact and ambulatory. No loss of bowel or bladder control. No concern for cauda equina. No fever, hx of CA, or hx of IVDU. RICE protocol and pain medicine indicated and discussed with patient. Will discharge with supportive care. Return precautions given at discharge. Patient agreeable to plan with no unaddressed concerns; discharged in satisfactory condition.  I personally performed the services described in this documentation, which was scribed in my presence. The recorded information has been reviewed and is accurate.    Filed Vitals:   06/15/15 2255  BP: 125/75  Pulse: 68  Temp: 98.6 F (37 C)  TempSrc: Oral  Resp: 16  SpO2: 96%     Antonietta Breach, PA-C 06/15/15 2333  Lacretia Leigh, MD 06/19/15 1535

## 2015-06-22 DIAGNOSIS — M545 Low back pain: Secondary | ICD-10-CM | POA: Diagnosis not present

## 2015-07-18 DIAGNOSIS — M545 Low back pain: Secondary | ICD-10-CM | POA: Diagnosis not present

## 2015-07-21 DIAGNOSIS — M5416 Radiculopathy, lumbar region: Secondary | ICD-10-CM | POA: Diagnosis not present

## 2015-07-21 DIAGNOSIS — M545 Low back pain: Secondary | ICD-10-CM | POA: Diagnosis not present

## 2015-07-26 DIAGNOSIS — M545 Low back pain: Secondary | ICD-10-CM | POA: Diagnosis not present

## 2015-07-26 DIAGNOSIS — M5416 Radiculopathy, lumbar region: Secondary | ICD-10-CM | POA: Diagnosis not present

## 2015-08-01 DIAGNOSIS — M545 Low back pain: Secondary | ICD-10-CM | POA: Diagnosis not present

## 2015-08-01 DIAGNOSIS — M5416 Radiculopathy, lumbar region: Secondary | ICD-10-CM | POA: Diagnosis not present

## 2015-08-03 DIAGNOSIS — M545 Low back pain: Secondary | ICD-10-CM | POA: Diagnosis not present

## 2015-08-03 DIAGNOSIS — M5416 Radiculopathy, lumbar region: Secondary | ICD-10-CM | POA: Diagnosis not present

## 2015-08-07 DIAGNOSIS — M545 Low back pain: Secondary | ICD-10-CM | POA: Diagnosis not present

## 2015-08-07 DIAGNOSIS — M5416 Radiculopathy, lumbar region: Secondary | ICD-10-CM | POA: Diagnosis not present

## 2015-08-10 DIAGNOSIS — M5416 Radiculopathy, lumbar region: Secondary | ICD-10-CM | POA: Diagnosis not present

## 2015-08-10 DIAGNOSIS — M545 Low back pain: Secondary | ICD-10-CM | POA: Diagnosis not present

## 2015-08-15 DIAGNOSIS — M5416 Radiculopathy, lumbar region: Secondary | ICD-10-CM | POA: Diagnosis not present

## 2015-08-15 DIAGNOSIS — M545 Low back pain: Secondary | ICD-10-CM | POA: Diagnosis not present

## 2015-08-17 DIAGNOSIS — M545 Low back pain: Secondary | ICD-10-CM | POA: Diagnosis not present

## 2015-08-17 DIAGNOSIS — M5416 Radiculopathy, lumbar region: Secondary | ICD-10-CM | POA: Diagnosis not present

## 2015-08-21 DIAGNOSIS — M545 Low back pain: Secondary | ICD-10-CM | POA: Diagnosis not present

## 2015-08-22 DIAGNOSIS — M5416 Radiculopathy, lumbar region: Secondary | ICD-10-CM | POA: Diagnosis not present

## 2015-08-28 DIAGNOSIS — M545 Low back pain: Secondary | ICD-10-CM | POA: Diagnosis not present

## 2015-09-01 ENCOUNTER — Other Ambulatory Visit: Payer: Self-pay | Admitting: Family Medicine

## 2015-09-01 DIAGNOSIS — M545 Low back pain, unspecified: Secondary | ICD-10-CM

## 2015-09-01 DIAGNOSIS — G8929 Other chronic pain: Secondary | ICD-10-CM

## 2015-09-12 ENCOUNTER — Other Ambulatory Visit: Payer: Medicare Other

## 2015-09-14 ENCOUNTER — Other Ambulatory Visit (HOSPITAL_BASED_OUTPATIENT_CLINIC_OR_DEPARTMENT_OTHER): Payer: Medicare Other

## 2015-09-14 ENCOUNTER — Telehealth: Payer: Self-pay | Admitting: Oncology

## 2015-09-14 ENCOUNTER — Ambulatory Visit (HOSPITAL_BASED_OUTPATIENT_CLINIC_OR_DEPARTMENT_OTHER): Payer: Medicare Other | Admitting: Oncology

## 2015-09-14 VITALS — BP 122/68 | HR 57 | Temp 98.9°F | Resp 18 | Ht 71.0 in | Wt 228.7 lb

## 2015-09-14 DIAGNOSIS — D72819 Decreased white blood cell count, unspecified: Secondary | ICD-10-CM

## 2015-09-14 DIAGNOSIS — D869 Sarcoidosis, unspecified: Secondary | ICD-10-CM

## 2015-09-14 LAB — CBC WITH DIFFERENTIAL/PLATELET
BASO%: 0.3 % (ref 0.0–2.0)
BASOS ABS: 0 10*3/uL (ref 0.0–0.1)
EOS%: 3.8 % (ref 0.0–7.0)
Eosinophils Absolute: 0.1 10*3/uL (ref 0.0–0.5)
HEMATOCRIT: 40.1 % (ref 38.4–49.9)
HGB: 13.7 g/dL (ref 13.0–17.1)
LYMPH#: 2.2 10*3/uL (ref 0.9–3.3)
LYMPH%: 59.1 % — ABNORMAL HIGH (ref 14.0–49.0)
MCH: 28.2 pg (ref 27.2–33.4)
MCHC: 34.2 g/dL (ref 32.0–36.0)
MCV: 82.7 fL (ref 79.3–98.0)
MONO#: 0.2 10*3/uL (ref 0.1–0.9)
MONO%: 6.5 % (ref 0.0–14.0)
NEUT%: 30.3 % — ABNORMAL LOW (ref 39.0–75.0)
NEUTROS ABS: 1.1 10*3/uL — AB (ref 1.5–6.5)
Platelets: 205 10*3/uL (ref 140–400)
RBC: 4.85 10*6/uL (ref 4.20–5.82)
RDW: 13.4 % (ref 11.0–14.6)
WBC: 3.7 10*3/uL — AB (ref 4.0–10.3)

## 2015-09-14 NOTE — Progress Notes (Signed)
Hematology and Oncology Follow Up Visit  Grant Miller [id number] 05/06/1975 40 y.o. 09/14/2015 8:18 AM   Principle Diagnosis: 40 year old man with chronic fluctuating neutropenia diagnosed in 07/2011. This is likely a reactive and benign process.     Current therapy: Observation and follow up.   Interim History: Mr. Delira presents today for a follow up visit. Since his last visit, he reports no major changes in his health. He is under a lot of stress recently as he is attending to the health of his father who had a myocardial infarction. He denied any recent infections or hospitalizations. He continues to have chronic back pain issues. He continues to perform activities of daily living without any decline. He is scheduled to have a epidural injection for his chronic pain issues.  He does not report any headaches, blurry vision, syncope or seizures. He had not had any fevers, had not had any chills, had not had any recurrent sinopulmonary infection. He does not report any cough, wheezing or hemoptysis. He does not report any chest pain, palpitation or leg edema. He does not report any nausea, vomiting or early satiety. He does have chronic abdominal pain which is unchanged. Does not report any genitourinary complaints. Rest or view of system is unremarkable   Medications: I have reviewed the patient's current medications.   Current Outpatient Prescriptions  Medication Sig Dispense Refill  . acetaminophen (TYLENOL) 500 MG tablet Take 500 mg by mouth every 6 (six) hours as needed.    . Diphenhydramine-APAP, sleep, (TYLENOL PM EXTRA STRENGTH PO) Take by mouth.    Marland Kitchen HYDROcodone-acetaminophen (NORCO/VICODIN) 5-325 MG tablet Take 1 tablet by mouth every 4 (four) hours as needed. 10 tablet 0  . ibuprofen (ADVIL,MOTRIN) 200 MG tablet Take 200 mg by mouth every 8 (eight) hours as needed for pain. As needed for discomfort    . methocarbamol (ROBAXIN) 500 MG tablet Take 1 tablet (500 mg total) by mouth 2  (two) times daily. 20 tablet 0  . predniSONE (DELTASONE) 20 MG tablet Take 2 tablets (40 mg total) by mouth daily. 10 tablet 0  . pregabalin (LYRICA) 100 MG capsule Take 1 capsule (100 mg total) by mouth 2 (two) times daily. 60 capsule 1   No current facility-administered medications for this visit.    Allergies:  Allergies  Allergen Reactions  . Other Hives and Shortness Of Breath    All seafood causes this reaction    Past Medical History, Surgical history, Social history, and Family History were reviewed and updated.   Physical Exam: Blood pressure 122/68, pulse 57, temperature 98.9 F (37.2 C), temperature source Oral, resp. rate 18, height 5\' 11"  (1.803 m), weight 228 lb 11.2 oz (103.738 kg), SpO2 100 %. ECOG: 0 General appearance: Well-appearing gentleman without distress. Head: Normocephalic. Oral mucosa without thrush. Neck: no adenopathy Lymph nodes: Cervical, supraclavicular, and axillary nodes normal. Heart:regular rate and rhythm, S1, S2 normal, no murmur, click, rub or gallop Lung:chest clear, no wheezing, rales, normal symmetric air entry Abdomin: Soft with good bowel sounds. No rebound or guarding. No organomegaly. Slightly tender on deep palpation. EXT:no erythema, induration, or nodules   Lab Results: Lab Results  Component Value Date   WBC 3.7* 09/14/2015   HGB 13.7 09/14/2015   HCT 40.1 09/14/2015   MCV 82.7 09/14/2015   PLT 205 09/14/2015     Chemistry      Component Value Date/Time   NA 142 08/16/2014 0759   NA 141 07/31/2011 0925   K  3.8 08/16/2014 0759   K 3.9 07/31/2011 0925   CL 107 07/31/2011 0925   CO2 22 08/16/2014 0759   CO2 26 07/31/2011 0925   BUN 19.6 08/16/2014 0759   BUN 14 07/31/2011 0925   CREATININE 1.2 08/16/2014 0759   CREATININE 1.04 07/31/2011 0925      Component Value Date/Time   CALCIUM 9.2 08/16/2014 0759   CALCIUM 9.3 07/31/2011 0925   ALKPHOS 62 08/16/2014 0759   ALKPHOS 65 07/31/2011 0925   AST 35* 08/16/2014  0759   AST 23 07/31/2011 0925   ALT 36 08/16/2014 0759   ALT 26 07/31/2011 0925   BILITOT 0.43 08/16/2014 0759   BILITOT 0.5 07/31/2011 0925       Impression and Plan:   40 year old gentleman with the following issues.   1. Fluctuating mild leukocytopenia. His white cell count has fluctuated between 3.5 to a normal range of about 4.   His white cell count is slightly lower than previous counts but his differential is normal. I recommended continued observation and surveillance and he prefers to return here periodically to make sure it is checked. We will check that again in December 2017.  2. Health maintenance issues. He has established care with a primary care provider. He is up-to-date on routine health screening.  3. Back pain: Epidural injection is scheduled in the near future.  4. Follow-up will be in December 2017.   [facility], MD 7/13/20178:18 AM

## 2015-09-14 NOTE — Telephone Encounter (Signed)
per pof to sch pt appt-gave pt copy of avs °

## 2015-09-15 ENCOUNTER — Other Ambulatory Visit: Payer: Medicare Other

## 2015-09-19 ENCOUNTER — Other Ambulatory Visit: Payer: Medicare Other

## 2015-10-02 DIAGNOSIS — M5416 Radiculopathy, lumbar region: Secondary | ICD-10-CM | POA: Diagnosis not present

## 2016-01-18 ENCOUNTER — Encounter: Payer: Self-pay | Admitting: Internal Medicine

## 2016-01-18 ENCOUNTER — Ambulatory Visit (INDEPENDENT_AMBULATORY_CARE_PROVIDER_SITE_OTHER): Payer: Medicare Other | Admitting: Internal Medicine

## 2016-01-18 ENCOUNTER — Other Ambulatory Visit (INDEPENDENT_AMBULATORY_CARE_PROVIDER_SITE_OTHER): Payer: Medicare Other

## 2016-01-18 VITALS — HR 76 | Temp 98.8°F | Resp 16 | Ht 71.0 in | Wt 234.0 lb

## 2016-01-18 DIAGNOSIS — R7301 Impaired fasting glucose: Secondary | ICD-10-CM | POA: Diagnosis not present

## 2016-01-18 DIAGNOSIS — E789 Disorder of lipoprotein metabolism, unspecified: Secondary | ICD-10-CM | POA: Diagnosis not present

## 2016-01-18 DIAGNOSIS — R5383 Other fatigue: Secondary | ICD-10-CM | POA: Diagnosis not present

## 2016-01-18 DIAGNOSIS — E538 Deficiency of other specified B group vitamins: Secondary | ICD-10-CM | POA: Diagnosis not present

## 2016-01-18 DIAGNOSIS — Z0000 Encounter for general adult medical examination without abnormal findings: Secondary | ICD-10-CM

## 2016-01-18 DIAGNOSIS — N529 Male erectile dysfunction, unspecified: Secondary | ICD-10-CM | POA: Diagnosis not present

## 2016-01-18 DIAGNOSIS — R0683 Snoring: Secondary | ICD-10-CM | POA: Diagnosis not present

## 2016-01-18 LAB — COMPREHENSIVE METABOLIC PANEL
ALK PHOS: 48 U/L (ref 39–117)
ALT: 43 U/L (ref 0–53)
AST: 39 U/L — AB (ref 0–37)
Albumin: 4.7 g/dL (ref 3.5–5.2)
BILIRUBIN TOTAL: 0.4 mg/dL (ref 0.2–1.2)
BUN: 21 mg/dL (ref 6–23)
CO2: 26 meq/L (ref 19–32)
CREATININE: 1.34 mg/dL (ref 0.40–1.50)
Calcium: 9.7 mg/dL (ref 8.4–10.5)
Chloride: 108 mEq/L (ref 96–112)
GFR: 75.16 mL/min (ref >60.00)
GLUCOSE: 106 mg/dL — AB (ref 70–99)
Potassium: 4.2 mEq/L (ref 3.5–5.1)
SODIUM: 140 meq/L (ref 135–145)
Total Protein: 7.4 g/dL (ref 6.0–8.3)

## 2016-01-18 LAB — CBC
HCT: 42.4 % (ref 39.0–52.0)
Hemoglobin: 13.9 g/dL (ref 13.0–17.0)
MCHC: 32.9 g/dL (ref 30.0–36.0)
MCV: 83.8 fl (ref 78.0–100.0)
PLATELETS: 235 10*3/uL (ref 150.0–400.0)
RBC: 5.06 Mil/uL (ref 4.22–5.81)
RDW: 13.6 % (ref 11.5–15.5)
WBC: 4.9 10*3/uL (ref 4.0–10.5)

## 2016-01-18 LAB — LIPID PANEL
CHOL/HDL RATIO: 5
Cholesterol: 187 mg/dL (ref 0–200)
HDL: 35.3 mg/dL — AB (ref >39.00)
LDL Cholesterol: 126 mg/dL — ABNORMAL HIGH (ref 0–99)
NONHDL: 151.27
Triglycerides: 127 mg/dL (ref 0.0–149.0)
VLDL: 25.4 mg/dL (ref 0.0–40.0)

## 2016-01-18 LAB — HEMOGLOBIN A1C: HEMOGLOBIN A1C: 6.2 % (ref 4.6–6.5)

## 2016-01-18 LAB — TSH: TSH: 0.53 u[IU]/mL (ref 0.35–4.50)

## 2016-01-18 LAB — VITAMIN B12: VITAMIN B 12: 748 pg/mL (ref 211–911)

## 2016-01-18 MED ORDER — SILDENAFIL CITRATE 100 MG PO TABS: 50.0000 mg | ORAL_TABLET | Freq: Every day | ORAL | 11 refills | Status: DC | PRN

## 2016-01-18 NOTE — Patient Instructions (Signed)
We are checking the labs today and will call you back with the results.   We have ordered the sleep study and you will get a call back about doing that.   Health Maintenance, Male A healthy lifestyle and preventative care can promote health and wellness.  Maintain regular health, dental, and eye exams.  Eat a healthy diet. Foods like vegetables, fruits, whole grains, low-fat dairy products, and lean protein foods contain the nutrients you need and are low in calories. Decrease your intake of foods high in solid fats, added sugars, and salt. Get information about a proper diet from your health care provider, if necessary.  Regular physical exercise is one of the most important things you can do for your health. Most adults should get at least 150 minutes of moderate-intensity exercise (any activity that increases your heart rate and causes you to sweat) each week. In addition, most adults need muscle-strengthening exercises on 2 or more days a week.   Maintain a healthy weight. The body mass index (BMI) is a screening tool to identify possible weight problems. It provides an estimate of body fat based on height and weight. Your health care provider can find your BMI and can help you achieve or maintain a healthy weight. For males 20 years and older:  A BMI below 18.5 is considered underweight.  A BMI of 18.5 to 24.9 is normal.  A BMI of 25 to 29.9 is considered overweight.  A BMI of 30 and above is considered obese.  Maintain normal blood lipids and cholesterol by exercising and minimizing your intake of saturated fat. Eat a balanced diet with plenty of fruits and vegetables. Blood tests for lipids and cholesterol should begin at age 3 and be repeated every 5 years. If your lipid or cholesterol levels are high, you are over age 49, or you are at high risk for heart disease, you may need your cholesterol levels checked more frequently.Ongoing high lipid and cholesterol levels should be  treated with medicines if diet and exercise are not working.  If you smoke, find out from your health care provider how to quit. If you do not use tobacco, do not start.  Lung cancer screening is recommended for adults aged 33-80 years who are at high risk for developing lung cancer because of a history of smoking. A yearly low-dose CT scan of the lungs is recommended for people who have at least a 30-pack-year history of smoking and are current smokers or have quit within the past 15 years. A pack year of smoking is smoking an average of 1 pack of cigarettes a day for 1 year (for example, a 30-pack-year history of smoking could mean smoking 1 pack a day for 30 years or 2 packs a day for 15 years). Yearly screening should continue until the smoker has stopped smoking for at least 15 years. Yearly screening should be stopped for people who develop a health problem that would prevent them from having lung cancer treatment.  If you choose to drink alcohol, do not have more than 2 drinks per day. One drink is considered to be 12 oz (360 mL) of beer, 5 oz (150 mL) of wine, or 1.5 oz (45 mL) of liquor.  Avoid the use of street drugs. Do not share needles with anyone. Ask for help if you need support or instructions about stopping the use of drugs.  High blood pressure causes heart disease and increases the risk of stroke. High blood pressure is more  likely to develop in:  People who have blood pressure in the end of the normal range (100-139/85-89 mm Hg).  People who are overweight or obese.  People who are African American.  If you are 31-3 years of age, have your blood pressure checked every 3-5 years. If you are 75 years of age or older, have your blood pressure checked every year. You should have your blood pressure measured twice-once when you are at a hospital or clinic, and once when you are not at a hospital or clinic. Record the average of the two measurements. To check your blood pressure when  you are not at a hospital or clinic, you can use:  An automated blood pressure machine at a pharmacy.  A home blood pressure monitor.  If you are 50-55 years old, ask your health care provider if you should take aspirin to prevent heart disease.  Diabetes screening involves taking a blood sample to check your fasting blood sugar level. This should be done once every 3 years after age 64 if you are at a normal weight and without risk factors for diabetes. Testing should be considered at a younger age or be carried out more frequently if you are overweight and have at least 1 risk factor for diabetes.  Colorectal cancer can be detected and often prevented. Most routine colorectal cancer screening begins at the age of 67 and continues through age 50. However, your health care provider may recommend screening at an earlier age if you have risk factors for colon cancer. On a yearly basis, your health care provider may provide home test kits to check for hidden blood in the stool. A small camera at the end of a tube may be used to directly examine the colon (sigmoidoscopy or colonoscopy) to detect the earliest forms of colorectal cancer. Talk to your health care provider about this at age 40 when routine screening begins. A direct exam of the colon should be repeated every 5-10 years through age 44, unless early forms of precancerous polyps or small growths are found.  People who are at an increased risk for hepatitis B should be screened for this virus. You are considered at high risk for hepatitis B if:  You were born in a country where hepatitis B occurs often. Talk with your health care provider about which countries are considered high risk.  Your parents were born in a high-risk country and you have not received a shot to protect against hepatitis B (hepatitis B vaccine).  You have HIV or AIDS.  You use needles to inject street drugs.  You live with, or have sex with, someone who has hepatitis  B.  You are a man who has sex with other men (MSM).  You get hemodialysis treatment.  You take certain medicines for conditions like cancer, organ transplantation, and autoimmune conditions.  Hepatitis C blood testing is recommended for all people born from [birth year] through 1967 and any individual with known risk factors for hepatitis C.  Healthy men should no longer receive prostate-specific antigen (PSA) blood tests as part of routine cancer screening. Talk to your health care provider about prostate cancer screening.  Testicular cancer screening is not recommended for adolescents or adult males who have no symptoms. Screening includes self-exam, a health care provider exam, and other screening tests. Consult with your health care provider about any symptoms you have or any concerns you have about testicular cancer.  Practice safe sex. Use condoms and avoid high-risk sexual practices  to reduce the spread of sexually transmitted infections (STIs).  You should be screened for STIs, including gonorrhea and chlamydia if:  You are sexually active and are younger than 24 years.  You are older than 24 years, and your health care provider tells you that you are at risk for this type of infection.  Your sexual activity has changed since you were last screened, and you are at an increased risk for chlamydia or gonorrhea. Ask your health care provider if you are at risk.  If you are at risk of being infected with HIV, it is recommended that you take a prescription medicine daily to prevent HIV infection. This is called pre-exposure prophylaxis (PrEP). You are considered at risk if:  You are a man who has sex with other men (MSM).  You are a heterosexual man who is sexually active with multiple partners.  You take drugs by injection.  You are sexually active with a partner who has HIV.  Talk with your health care provider about whether you are at high risk of being infected with HIV. If you  choose to begin PrEP, you should first be tested for HIV. You should then be tested every 3 months for as long as you are taking PrEP.  Use sunscreen. Apply sunscreen liberally and repeatedly throughout the day. You should seek shade when your shadow is shorter than you. Protect yourself by wearing long sleeves, pants, a wide-brimmed hat, and sunglasses year round whenever you are outdoors.  Tell your health care provider of new moles or changes in moles, especially if there is a change in shape or color. Also, tell your health care provider if a mole is larger than the size of a pencil eraser.  A one-time screening for abdominal aortic aneurysm (AAA) and surgical repair of large AAAs by ultrasound is recommended for men aged 8-75 years who are current or former smokers.  Stay current with your vaccines (immunizations). This information is not intended to replace advice given to you by your health care provider. Make sure you discuss any questions you have with your health care provider. Document Released: 08/17/2007 Document Revised: 03/11/2014 Document Reviewed: 11/22/2014 Elsevier Interactive Patient Education  2017 [employer].

## 2016-01-18 NOTE — Progress Notes (Signed)
Pre visit review using our clinic review tool, if applicable. No additional management support is needed unless otherwise documented below in the visit note. 

## 2016-01-18 NOTE — Progress Notes (Signed)
   Subjective:    Patient ID: Grant Miller, male    DOB: 1975-09-27, 40 y.o.   MRN: [id number]  HPI Here for medicare wellness,several new complaints (see below). Please see A/P for status and treatment of chronic medical problems.   HPI #2: New problems of ED (wants to take meds for it, problems with hardness and ability to get erection, problems about 80-90% of the time, wants samples, has not tried anything for it), snoring (having problems with feeling tired in the morning, told he snores loud, wants to be checked for sleep apnea, has not tried anything for it), and fatigue (feels extra tired in the last several months, feels something is wrong, denies weight change, no fevers or chills, no night sweats, no numbness in his feet).  Diet: average Physical activity: sedentary Depression/mood screen: negative Hearing: intact to whispered voice Visual acuity: grossly normal, performs annual eye exam  ADLs: capable Fall risk: none Home safety: good Cognitive evaluation: intact to orientation, naming, recall and repetition EOL planning: adv directives discussed, not in place  I have personally reviewed and have noted 1. The patient's medical and social history - reviewed today no changes 2. Their use of alcohol, tobacco or illicit drugs 3. Their current medications and supplements 4. The patient's functional ability including ADL's, fall risks, home safety risks and hearing or visual impairment. 5. Diet and physical activities 6. Evidence for depression or mood disorders 7. Care team reviewed and updated (available in snapshot)  Review of Systems  Constitutional: Positive for activity change and fatigue. Negative for appetite change, chills, fever and unexpected weight change.  HENT: Negative.   Eyes: Negative.   Respiratory: Negative.   Cardiovascular: Negative.   Gastrointestinal: Positive for abdominal pain. Negative for abdominal distention, constipation, diarrhea and nausea.    Genitourinary:       ED  Musculoskeletal: Negative.   Skin: Negative.   Neurological: Negative.   Psychiatric/Behavioral: Negative.       Objective:   Physical Exam  Constitutional: He is oriented to person, place, and time. He appears well-developed and well-nourished.  HENT:  Head: Normocephalic and atraumatic.  Eyes: EOM are normal.  Neck: Normal range of motion.  Cardiovascular: Normal rate and regular rhythm.   Pulmonary/Chest: Effort normal and breath sounds normal. No respiratory distress. He has no wheezes. He has no rales.  Abdominal: Soft. Bowel sounds are normal. He exhibits no distension and no mass. There is tenderness. There is no rebound and no guarding.  Musculoskeletal: He exhibits no edema.  Neurological: He is alert and oriented to person, place, and time. Coordination normal.  Skin: Skin is warm and dry.  Psychiatric: He has a normal mood and affect.   Vitals:   01/18/16 1319  Pulse: 76  Resp: 16  Temp: 98.8 F (37.1 C)  TempSrc: Oral  SpO2: 97%  Weight: 234 lb (106.1 kg)  Height: 5\' 11"  (1.803 m)      Assessment & Plan:

## 2016-01-20 DIAGNOSIS — N529 Male erectile dysfunction, unspecified: Secondary | ICD-10-CM | POA: Insufficient documentation

## 2016-01-20 DIAGNOSIS — Z0000 Encounter for general adult medical examination without abnormal findings: Secondary | ICD-10-CM | POA: Insufficient documentation

## 2016-01-20 DIAGNOSIS — R0683 Snoring: Secondary | ICD-10-CM | POA: Insufficient documentation

## 2016-01-20 DIAGNOSIS — R5383 Other fatigue: Secondary | ICD-10-CM | POA: Insufficient documentation

## 2016-01-20 NOTE — Assessment & Plan Note (Signed)
Checking TSH, B12, CBC, CMP and adjust as needed.

## 2016-01-20 NOTE — Assessment & Plan Note (Signed)
Rx for viagra given to him and informed that we do not have samples for this although he continues to demand samples multiple times.

## 2016-01-20 NOTE — Assessment & Plan Note (Signed)
Declines flu and tetanus shot. Denies need for HIV screening. Checking labs, needs colonoscopy at 59. Counseled on the need for exercise and dangers of distracted driving. Given 10 year screening recommendations.

## 2016-01-20 NOTE — Assessment & Plan Note (Signed)
Ordered home sleep test to evaluate for OSA as this could be related to his daytime fatigue.

## 2016-02-14 DIAGNOSIS — N529 Male erectile dysfunction, unspecified: Secondary | ICD-10-CM | POA: Diagnosis not present

## 2016-02-14 DIAGNOSIS — R5383 Other fatigue: Secondary | ICD-10-CM | POA: Diagnosis not present

## 2016-02-14 DIAGNOSIS — G4710 Hypersomnia, unspecified: Secondary | ICD-10-CM | POA: Diagnosis not present

## 2016-02-14 DIAGNOSIS — R0683 Snoring: Secondary | ICD-10-CM | POA: Diagnosis not present

## 2016-02-16 ENCOUNTER — Ambulatory Visit (HOSPITAL_BASED_OUTPATIENT_CLINIC_OR_DEPARTMENT_OTHER): Payer: Medicare Other | Admitting: Oncology

## 2016-02-16 ENCOUNTER — Telehealth: Payer: Self-pay | Admitting: Oncology

## 2016-02-16 ENCOUNTER — Other Ambulatory Visit (HOSPITAL_BASED_OUTPATIENT_CLINIC_OR_DEPARTMENT_OTHER): Payer: Medicare Other

## 2016-02-16 VITALS — BP 108/67 | HR 60 | Temp 98.4°F | Resp 18 | Ht 71.0 in | Wt 234.1 lb

## 2016-02-16 DIAGNOSIS — D72819 Decreased white blood cell count, unspecified: Secondary | ICD-10-CM | POA: Diagnosis not present

## 2016-02-16 DIAGNOSIS — D709 Neutropenia, unspecified: Secondary | ICD-10-CM

## 2016-02-16 LAB — CBC WITH DIFFERENTIAL/PLATELET
BASO%: 0.9 % (ref 0.0–2.0)
BASOS ABS: 0 10*3/uL (ref 0.0–0.1)
EOS%: 2.9 % (ref 0.0–7.0)
Eosinophils Absolute: 0.1 10*3/uL (ref 0.0–0.5)
HEMATOCRIT: 44.1 % (ref 38.4–49.9)
HEMOGLOBIN: 14.2 g/dL (ref 13.0–17.1)
LYMPH#: 2.2 10*3/uL (ref 0.9–3.3)
LYMPH%: 50.1 % — ABNORMAL HIGH (ref 14.0–49.0)
MCH: 27.2 pg (ref 27.2–33.4)
MCHC: 32.1 g/dL (ref 32.0–36.0)
MCV: 84.8 fL (ref 79.3–98.0)
MONO#: 0.3 10*3/uL (ref 0.1–0.9)
MONO%: 8 % (ref 0.0–14.0)
NEUT%: 38.1 % — ABNORMAL LOW (ref 39.0–75.0)
NEUTROS ABS: 1.6 10*3/uL (ref 1.5–6.5)
Platelets: 215 10*3/uL (ref 140–400)
RBC: 5.2 10*6/uL (ref 4.20–5.82)
RDW: 13.5 % (ref 11.0–14.6)
WBC: 4.3 10*3/uL (ref 4.0–10.3)

## 2016-02-16 NOTE — Telephone Encounter (Signed)
Appointments scheduled per 12/15 LOS. Patient given AVS report and calendars with future scheduled appointments. °

## 2016-02-16 NOTE — Progress Notes (Signed)
Hematology and Oncology Follow Up Visit  Hurl Ro LU:[id number] 1975/06/03 40 y.o. 02/16/2016 8:21 AM   Principle Diagnosis: 40 year old man with chronic fluctuating neutropenia diagnosed in 07/2011. This is likely a reactive and benign process.     Current therapy: Observation and follow up.   Interim History: Grant Miller presents today for a follow up visit. Since his last visit, he reports doing well. He has established care with a primary care physician and gets his routine care there at this time. He still has chronic complaints of back pain and occasional abdominal discomfort. But despite that his appetite up an excellent and able to perform activities of daily living. He denied any recent infections or hospitalizations. He denied any recurrent sinopulmonary infections. He denied any cellulitis or urinary tract infections.  He does not report any headaches, blurry vision, syncope or seizures. He had not had any fevers, had not had any chills, had not had any recurrent sinopulmonary infection. He does not report any cough, wheezing or hemoptysis. He does not report any chest pain, palpitation or leg edema. He does not report any nausea, vomiting or early satiety. He does have chronic abdominal pain which is unchanged. Does not report any genitourinary complaints. Rest or view of system is unremarkable   Medications: I have reviewed the patient's current medications.   Current Outpatient Prescriptions  Medication Sig Dispense Refill  . Diphenhydramine-APAP, sleep, (TYLENOL PM EXTRA STRENGTH PO) Take by mouth.    . sildenafil (VIAGRA) 100 MG tablet Take 0.5-1 tablets (50-100 mg total) by mouth daily as needed for erectile dysfunction. 5 tablet 11   No current facility-administered medications for this visit.     Allergies:  Allergies  Allergen Reactions  . Other Hives and Shortness Of Breath    All seafood causes this reaction    Past Medical History, Surgical history, Social  history, and Family History were reviewed and updated.   Physical Exam: Blood pressure 108/67, pulse 60, temperature 98.4 F (36.9 C), temperature source Oral, resp. rate 18, height 5\' 11"  (1.803 m), weight 234 lb 1.6 oz (106.2 kg), SpO2 100 %. ECOG: 0 General appearance: Alert, awake gentleman without distress. Head: Normocephalic. Oral mucosa without thrush. Neck: no adenopathy Lymph nodes: Cervical, supraclavicular, and axillary nodes normal. Heart:regular rate and rhythm, S1, S2 normal, no murmur, click, rub or gallop Lung:chest clear, no wheezing, rales, normal symmetric air entry Abdomin: Soft with good bowel sounds. No rebound or guarding. No shifting dullness or ascites. EXT:no erythema, induration, or nodules   Lab Results: Lab Results  Component Value Date   WBC 4.3 02/16/2016   HGB 14.2 02/16/2016   HCT 44.1 02/16/2016   MCV 84.8 02/16/2016   PLT 215 02/16/2016     Chemistry      Component Value Date/Time   NA 140 01/18/2016 1357   NA 142 08/16/2014 0759   K 4.2 01/18/2016 1357   K 3.8 08/16/2014 0759   CL 108 01/18/2016 1357   CO2 26 01/18/2016 1357   CO2 22 08/16/2014 0759   BUN 21 01/18/2016 1357   BUN 19.6 08/16/2014 0759   CREATININE 1.34 01/18/2016 1357   CREATININE 1.2 08/16/2014 0759      Component Value Date/Time   CALCIUM 9.7 01/18/2016 1357   CALCIUM 9.2 08/16/2014 0759   ALKPHOS 48 01/18/2016 1357   ALKPHOS 62 08/16/2014 0759   AST 39 (H) 01/18/2016 1357   AST 35 (H) 08/16/2014 0759   ALT 43 01/18/2016 1357  ALT 36 08/16/2014 0759   BILITOT 0.4 01/18/2016 1357   BILITOT 0.43 08/16/2014 0759       Impression and Plan:   40 year old gentleman with the following issues.   1. Fluctuating mild leukocytopenia. His white cell count has fluctuated between 3.5 to a normal range of about 4.   History ON cell count is 4.3 with absolute neutrophil count of 1600. The plan is to continue with active surveillance at this time and repeat CBC in 6  months.  2. Health maintenance issues. He has established care with a primary care provider. He is up-to-date on routine health screening.  3. Back pain: Manageable at this time with the pain clinic and has been recommended to have an epidural injections.  4. Follow-up: 6 months.   [facility], MD 12/15/20178:21 AM

## 2016-05-03 ENCOUNTER — Telehealth: Payer: Self-pay | Admitting: *Deleted

## 2016-05-03 NOTE — Telephone Encounter (Signed)
"  I'm trying to reach Grant Miller.  She is across the street from you all."  Provided number [phone] with instructions to press option for PCP/Internal medicine.  No further questions.

## 2016-05-17 ENCOUNTER — Telehealth: Payer: Self-pay | Admitting: Internal Medicine

## 2016-05-18 ENCOUNTER — Encounter: Payer: Self-pay | Admitting: Internal Medicine

## 2016-05-18 ENCOUNTER — Other Ambulatory Visit: Payer: Self-pay | Admitting: Internal Medicine

## 2016-05-18 ENCOUNTER — Ambulatory Visit (INDEPENDENT_AMBULATORY_CARE_PROVIDER_SITE_OTHER): Payer: Medicare Other | Admitting: Internal Medicine

## 2016-05-18 DIAGNOSIS — Z9109 Other allergy status, other than to drugs and biological substances: Secondary | ICD-10-CM | POA: Diagnosis not present

## 2016-05-18 MED ORDER — FLUTICASONE PROPIONATE 50 MCG/ACT NA SUSP: 2.0000 | Freq: Every day | NASAL | 0 refills | Status: DC

## 2016-05-18 MED ORDER — TOBRAMYCIN 0.3 % OP SOLN: 1.0000 [drp] | Freq: Four times a day (QID) | OPHTHALMIC | 0 refills | Status: DC

## 2016-05-18 NOTE — Progress Notes (Signed)
Pre visit review using our clinic review tool, if applicable. No additional management support is needed unless otherwise documented below in the visit note. 

## 2016-05-18 NOTE — Progress Notes (Signed)
Patient ID: Grant Miller, male   DOB: 1975-09-12, 41 y.o.   MRN: [id number]   Subjective:    Patient ID: Grant Miller, male    DOB: May 11, 1975, 41 y.o.   MRN: [id number]  HPI  Patient here as a work in with concerns regarding allergies.  He reports some allergy issues.  Some increased watering of his eyes.  Noticed recently some yellow crusting and drainage from his eyes.  No eye pain.  No sinus pressure.  No cough or chest congestion.  Some nasal congestion and drainage.  No fever.  No sob.      Past Medical History:  Diagnosis Date  . Depression    Past Surgical History:  Procedure Laterality Date  . ABDOMINAL SURGERY     Family History  Problem Relation Age of Onset  . Heart disease Mother   . Hyperlipidemia Mother   . Kidney disease Mother   . Diabetes Mother    Social History   Social History  . Marital status: Single    Spouse name: N/A  . Number of children: N/A  . Years of education: N/A   Social History Main Topics  . Smoking status: Never Smoker  . Smokeless tobacco: Never Used  . Alcohol use No  . Drug use: No  . Sexual activity: Not Asked   Other Topics Concern  . None   Social History Narrative  . None    Outpatient Encounter Prescriptions as of 05/18/2016  Medication Sig  . Diphenhydramine-APAP, sleep, (TYLENOL PM EXTRA STRENGTH PO) Take by mouth.  . fluticasone (FLONASE) 50 MCG/ACT nasal spray Place 2 sprays into both nostrils daily.  . sildenafil (VIAGRA) 100 MG tablet Take 0.5-1 tablets (50-100 mg total) by mouth daily as needed for erectile dysfunction. (Patient not taking: Reported on 05/18/2016)  . tobramycin (TOBREX) 0.3 % ophthalmic solution Place 1 drop into both eyes every 6 (six) hours.   No facility-administered encounter medications on file as of 05/18/2016.     Review of Systems  Constitutional: Negative for appetite change and fever.  HENT: Positive for congestion. Negative for sinus pressure and sore throat.   Eyes:       Eye  drainage.  Eye crusting and drainage as outlined.    Respiratory: Negative for cough, chest tightness and shortness of breath.   Cardiovascular: Negative for chest pain.  Gastrointestinal: Negative for abdominal pain, diarrhea, nausea and vomiting.  Skin: Negative for color change and rash.  Neurological: Negative for dizziness and headaches.       Objective:    Physical Exam  Constitutional: He appears well-developed and well-nourished. No distress.  HENT:  Mouth/Throat: Oropharynx is clear and moist.  Nares - erythematous turbinates.  No tenderness to palpation over the sinuses.    Eyes:  Minimal erythema - bilateral.  No pain to palpation over the eyes.    Neck: Neck supple.  Cardiovascular: Normal rate and regular rhythm.   Pulmonary/Chest: Effort normal and breath sounds normal. No respiratory distress.  Musculoskeletal: He exhibits no edema.  Lymphadenopathy:    He has no cervical adenopathy.  Skin: No rash noted. No erythema.  Psychiatric: He has a normal mood and affect. His behavior is normal.    BP 112/80   Pulse 68   Temp 97.8 F (36.6 C) (Oral)   Wt 239 lb 6.4 oz (108.6 kg)   SpO2 97%   BMI 33.39 kg/m  Wt Readings from Last 3 Encounters:  05/18/16 239 lb 6.4 oz (108.6 kg)  02/16/16 234 lb 1.6 oz (106.2 kg)  01/18/16 234 lb (106.1 kg)     Lab Results  Component Value Date   WBC 4.3 02/16/2016   HGB 14.2 02/16/2016   HCT 44.1 02/16/2016   PLT 215 02/16/2016   GLUCOSE 106 (H) 01/18/2016   CHOL 187 01/18/2016   TRIG 127.0 01/18/2016   HDL 35.30 (L) 01/18/2016   LDLCALC 126 (H) 01/18/2016   ALT 43 01/18/2016   AST 39 (H) 01/18/2016   NA 140 01/18/2016   K 4.2 01/18/2016   CL 108 01/18/2016   CREATININE 1.34 01/18/2016   BUN 21 01/18/2016   CO2 26 01/18/2016   TSH 0.53 01/18/2016   HGBA1C 6.2 01/18/2016       Assessment & Plan:   Problem List Items Addressed This Visit    Environmental allergies    Increased congestion as outlined.  Eye  drainage and now with yellow crusting, etc.  Will treat with flonase nasal spray as directed.  Has taken clarinex.  Treat with tobrex eye drops to cover for possible bacterial infection with the crusting,etc.  Explained needed f/u with opthalmology.            Einar Pheasant, MD

## 2016-05-18 NOTE — Patient Instructions (Signed)
flonase nasal spray - 2 sprays each nostril one time per day.  Do this in the evening.   

## 2016-05-19 ENCOUNTER — Encounter: Payer: Self-pay | Admitting: Internal Medicine

## 2016-05-19 DIAGNOSIS — Z9109 Other allergy status, other than to drugs and biological substances: Secondary | ICD-10-CM | POA: Insufficient documentation

## 2016-05-19 NOTE — Assessment & Plan Note (Signed)
Increased congestion as outlined.  Eye drainage and now with yellow crusting, etc.  Will treat with flonase nasal spray as directed.  Has taken clarinex.  Treat with tobrex eye drops to cover for possible bacterial infection with the crusting,etc.  Explained needed f/u with opthalmology.

## 2016-08-02 ENCOUNTER — Other Ambulatory Visit: Payer: Self-pay | Admitting: Internal Medicine

## 2016-08-07 ENCOUNTER — Ambulatory Visit (INDEPENDENT_AMBULATORY_CARE_PROVIDER_SITE_OTHER): Payer: Medicare Other | Admitting: Internal Medicine

## 2016-08-07 ENCOUNTER — Encounter: Payer: Self-pay | Admitting: Internal Medicine

## 2016-08-07 VITALS — BP 130/70 | HR 87 | Temp 98.8°F | Resp 12 | Ht 71.0 in | Wt 231.0 lb

## 2016-08-07 DIAGNOSIS — M67431 Ganglion, right wrist: Secondary | ICD-10-CM

## 2016-08-07 DIAGNOSIS — D229 Melanocytic nevi, unspecified: Secondary | ICD-10-CM

## 2016-08-07 MED ORDER — TRAMADOL HCL 50 MG PO TABS: 50.0000 mg | ORAL_TABLET | Freq: Three times a day (TID) | ORAL | 0 refills | Status: DC | PRN

## 2016-08-07 NOTE — Patient Instructions (Signed)
We have given you the tramadol medicine for pain.   We will get you in with the hand specialist to drain the cyst as well as the dermatologist to get the moles taken care of.

## 2016-08-09 DIAGNOSIS — M67431 Ganglion, right wrist: Secondary | ICD-10-CM | POA: Insufficient documentation

## 2016-08-09 NOTE — Progress Notes (Signed)
   Subjective:    Patient ID: Grant Miller, male    DOB: 1975-07-15, 41 y.o.   MRN: [id number]  HPI The patient is a 41 YO man coming in for ganglion cyst of the right wrist. He had one of these in the past and was surgically drained (not sure if removed or just drained). This has come back and is hurting. Causing pain and intermittent numbness in his hand. Worse with flexion. Going on for about 1-2 months and overall pain is worsening. Also wants referral to derm for several mole removal.   Review of Systems  Constitutional: Negative.   Respiratory: Negative.   Cardiovascular: Negative.   Musculoskeletal: Positive for arthralgias and joint swelling.  Skin: Negative.   Neurological: Positive for numbness.      Objective:   Physical Exam  Constitutional: He is oriented to person, place, and time. He appears well-developed and well-nourished.  HENT:  Head: Normocephalic and atraumatic.  Eyes: EOM are normal.  Neck: Normal range of motion.  Cardiovascular: Normal rate and regular rhythm.   Pulmonary/Chest: Effort normal.  Abdominal: Soft.  Musculoskeletal: He exhibits tenderness.  Ganglion cyst on the right wrist, several moles on the stomach  Neurological: He is alert and oriented to person, place, and time.  Skin: Skin is warm and dry.   Vitals:   08/07/16 1034  BP: 130/70  Pulse: 87  Resp: 12  Temp: 98.8 F (37.1 C)  TempSrc: Oral  SpO2: 98%  Weight: 231 lb (104.8 kg)  Height: 5\' 11"  (1.803 m)      Assessment & Plan:

## 2016-08-09 NOTE — Assessment & Plan Note (Addendum)
Referral to hand surgery for excision for permanent relief of symptoms. Rx for tramadol for temporary pain control until excision.

## 2016-08-16 ENCOUNTER — Telehealth: Payer: Self-pay | Admitting: Oncology

## 2016-08-16 ENCOUNTER — Ambulatory Visit (HOSPITAL_BASED_OUTPATIENT_CLINIC_OR_DEPARTMENT_OTHER): Payer: Medicare Other | Admitting: Oncology

## 2016-08-16 ENCOUNTER — Other Ambulatory Visit (HOSPITAL_BASED_OUTPATIENT_CLINIC_OR_DEPARTMENT_OTHER): Payer: Medicare Other

## 2016-08-16 VITALS — BP 140/92 | HR 62 | Temp 99.3°F | Resp 18 | Ht 71.0 in | Wt 233.8 lb

## 2016-08-16 DIAGNOSIS — D709 Neutropenia, unspecified: Secondary | ICD-10-CM | POA: Diagnosis not present

## 2016-08-16 DIAGNOSIS — M545 Low back pain, unspecified: Secondary | ICD-10-CM

## 2016-08-16 DIAGNOSIS — G8929 Other chronic pain: Secondary | ICD-10-CM

## 2016-08-16 DIAGNOSIS — N400 Enlarged prostate without lower urinary tract symptoms: Secondary | ICD-10-CM

## 2016-08-16 LAB — CBC WITH DIFFERENTIAL/PLATELET
BASO%: 1 % (ref 0.0–2.0)
BASOS ABS: 0 10*3/uL (ref 0.0–0.1)
EOS%: 2.3 % (ref 0.0–7.0)
Eosinophils Absolute: 0.1 10*3/uL (ref 0.0–0.5)
HEMATOCRIT: 41.5 % (ref 38.4–49.9)
HGB: 13.6 g/dL (ref 13.0–17.1)
LYMPH#: 2.2 10*3/uL (ref 0.9–3.3)
LYMPH%: 50.5 % — ABNORMAL HIGH (ref 14.0–49.0)
MCH: 27.7 pg (ref 27.2–33.4)
MCHC: 32.8 g/dL (ref 32.0–36.0)
MCV: 84.6 fL (ref 79.3–98.0)
MONO#: 0.4 10*3/uL (ref 0.1–0.9)
MONO%: 9.5 % (ref 0.0–14.0)
NEUT#: 1.6 10*3/uL (ref 1.5–6.5)
NEUT%: 36.7 % — AB (ref 39.0–75.0)
Platelets: 221 10*3/uL (ref 140–400)
RBC: 4.9 10*6/uL (ref 4.20–5.82)
RDW: 13.6 % (ref 11.0–14.6)
WBC: 4.4 10*3/uL (ref 4.0–10.3)

## 2016-08-16 NOTE — Telephone Encounter (Signed)
Fax form with medical record request given to Tiffany/HIM (covering for Maudie Mercury) to fax records to Dr Teola Bradley @ [facility].

## 2016-08-16 NOTE — Progress Notes (Signed)
Hematology and Oncology Follow Up Visit  Grant Miller [id number] Sep 13, 1975 40 y.o. 08/16/2016 8:19 AM   Principle Diagnosis: 41 year old man with chronic fluctuating neutropenia diagnosed in 07/2011. This is likely a reactive and benign process.     Current therapy: Observation and follow up.   Interim History: Grant Miller presents today for a follow up visit. Since his last visit, he reports few complaints mostly chronic in nature. He is reporting some frequent urination and difficulty voiding at times. He denied any hematuria or dysuria. He also reported symptoms of low energy and slight weight gain.  He continues to have chronic back pain issues and have recommended to have surgery which she is considering. He also have chronic abdominal pain from previous surgeries. She denied any recent infections or hospitalizations. He denied any lymphadenopathy or petechiae.  He does not report any headaches, blurry vision, syncope or seizures. He had not had any fevers, had not had any chills, had not had any recurrent sinopulmonary infection. He does not report any cough, wheezing or hemoptysis. He does not report any chest pain, palpitation or leg edema. He does not report any nausea, vomiting or early satiety. He does have chronic abdominal pain which is unchanged. Does not report any genitourinary complaints. Rest or view of system is unremarkable   Medications: I have reviewed the patient's current medications.   Current Outpatient Prescriptions  Medication Sig Dispense Refill  . Diphenhydramine-APAP, sleep, (TYLENOL PM EXTRA STRENGTH PO) Take by mouth.    . fluticasone (FLONASE) 50 MCG/ACT nasal spray PLACE 2 SPRAYS INTO BOTH NOSTRILS DAILY 16 g 3  . sildenafil (VIAGRA) 100 MG tablet Take 0.5-1 tablets (50-100 mg total) by mouth daily as needed for erectile dysfunction. 5 tablet 11  . tobramycin-dexamethasone (TOBRADEX) ophthalmic solution PLACE 1 DROP INTO BOTH EYES EVEYR 6 HOURS 5 mL 0  .  traMADol (ULTRAM) 50 MG tablet Take 1 tablet (50 mg total) by mouth every 8 (eight) hours as needed. 30 tablet 0   No current facility-administered medications for this visit.     Allergies:  Allergies  Allergen Reactions  . Iodine Hives and Shortness Of Breath  . Other Hives and Shortness Of Breath    All seafood causes this reaction    Past Medical History, Surgical history, Social history, and Family History were reviewed and updated.   Physical Exam: Blood pressure (!) 140/92, pulse 62, temperature 99.3 F (37.4 C), temperature source Oral, resp. rate 18, height 5\' 11"  (1.803 m), weight 233 lb 12.8 oz (106.1 kg), SpO2 97 %. ECOG: 0 General appearance: Well-appearing gentleman without distress. Head: Normocephalic. Oral mucosa without thrush. Neck: no adenopathy Lymph nodes: Cervical, supraclavicular, and axillary nodes normal. Heart:regular rate and rhythm, S1, S2 normal, no murmur, click, rub or gallop Lung:chest clear, no wheezing, rales, normal symmetric air entry Abdomin: Soft with good bowel sounds. No shifting dullness or ascites. EXT:no erythema, induration, or nodules   Lab Results: Lab Results  Component Value Date   WBC 4.4 08/16/2016   HGB 13.6 08/16/2016   HCT 41.5 08/16/2016   MCV 84.6 08/16/2016   PLT 221 08/16/2016     Chemistry      Component Value Date/Time   NA 140 01/18/2016 1357   NA 142 08/16/2014 0759   K 4.2 01/18/2016 1357   K 3.8 08/16/2014 0759   CL 108 01/18/2016 1357   CO2 26 01/18/2016 1357   CO2 22 08/16/2014 0759   BUN 21 01/18/2016 1357  BUN 19.6 08/16/2014 0759   CREATININE 1.34 01/18/2016 1357   CREATININE 1.2 08/16/2014 0759      Component Value Date/Time   CALCIUM 9.7 01/18/2016 1357   CALCIUM 9.2 08/16/2014 0759   ALKPHOS 48 01/18/2016 1357   ALKPHOS 62 08/16/2014 0759   AST 39 (H) 01/18/2016 1357   AST 35 (H) 08/16/2014 0759   ALT 43 01/18/2016 1357   ALT 36 08/16/2014 0759   BILITOT 0.4 01/18/2016 1357    BILITOT 0.43 08/16/2014 0759       Impression and Plan:   41 year old gentleman with the following issues.   1. Fluctuating mild leukocytopenia. His white cell count has fluctuated between 3.5 to a normal range of about 4.   His white cell count today is within normal range with slight lymphocytosis. No intervention is needed at this time I have recommended continued observation.  2. Health maintenance issues. He has established care with a primary care provider. He is up-to-date on routine health screening.  He is interested in prostate cancer screening given his family history of other malignancies. He does report urinary symptoms and interested in urology referral. I'll make the appropriate referral for urology for management of his BPH symptoms and possibly prostate cancer screening. He is also complaining of symptoms of lack of energy and weight gain which could be a sign of low testosterone as well.  3. Back pain: Manageable at this time with the pain clinic and has been recommended to have an epidural injections.  4. Follow-up: 6 months.   [facility], MD 6/15/20188:19 AM

## 2016-08-16 NOTE — Telephone Encounter (Signed)
Consult scheduled with Arlana Lindau @ [facility] for Tuesday 08/20/16 @ 10:30 a.m. @ [address], 2nd floor. [address]. Patient is aware of appointment. Follow up and lab scheduled for January, per 08/16/16. Patient was given a copy of the AVS report and appointment schedule, per 08/16/16.

## 2016-08-20 DIAGNOSIS — R3915 Urgency of urination: Secondary | ICD-10-CM | POA: Diagnosis not present

## 2016-08-20 DIAGNOSIS — R6882 Decreased libido: Secondary | ICD-10-CM | POA: Diagnosis not present

## 2016-08-20 DIAGNOSIS — R350 Frequency of micturition: Secondary | ICD-10-CM | POA: Diagnosis not present

## 2016-09-06 ENCOUNTER — Ambulatory Visit (INDEPENDENT_AMBULATORY_CARE_PROVIDER_SITE_OTHER): Payer: Medicare Other | Admitting: Orthopaedic Surgery

## 2016-09-06 DIAGNOSIS — M67431 Ganglion, right wrist: Secondary | ICD-10-CM | POA: Diagnosis not present

## 2016-09-06 NOTE — Progress Notes (Signed)
Office Visit Note   Patient: Grant Miller           Date of Birth: 03/03/1976           MRN: [id number] Visit Date: 09/06/2016              Requested by: Hoyt Koch, MD [place], [place] [address] PCP: Hoyt Koch, MD   Assessment & Plan: Visit Diagnoses:  1. Ganglion cyst of wrist, right     Plan: We discussed treatment options including aspiration versus surgical excision. We discussed the risks benefits alternatives to each. He would like to try aspiration neck suite. We'll see him at that time.  Follow-Up Instructions: Return in about 1 week (around 09/13/2016).   Orders:  No orders of the defined types were placed in this encounter.  No orders of the defined types were placed in this encounter.     Procedures: No procedures performed   Clinical Data: No additional findings.   Subjective: No chief complaint on file.   Grant Miller is a 41 year old gentleman who comes in with a right dorsal wrist ganglion cyst. He had this previously excised surgically but this did recur. He is also complaining of a left hip lipoma. He denies any constitutional symptoms. He states the lipoma doesn't really bother him that much but the right wrist ganglion cyst does bother him especially with wrist flexion. He denies any numbness or tingling.    Review of Systems  Constitutional: Negative.   All other systems reviewed and are negative.    Objective: Vital Signs: There were no vitals taken for this visit.  Physical Exam  Constitutional: He is oriented to person, place, and time. He appears well-developed and well-nourished.  HENT:  Head: Normocephalic and atraumatic.  Eyes: Pupils are equal, round, and reactive to light.  Neck: Neck supple.  Pulmonary/Chest: Effort normal.  Abdominal: Soft.  Musculoskeletal: Normal range of motion.  Neurological: He is alert and oriented to person, place, and time.  Skin: Skin is warm.  Psychiatric:  He has a normal mood and affect. His behavior is normal. Judgment and thought content normal.  Nursing note and vitals reviewed.   Ortho Exam Right wrist exam shows a small palpable ganglion cyst and a previous fully healed surgical scar. There areoverlying skin changes. Hand function is normal. Specialty Comments:  No specialty comments available.  Imaging: No results found.   PMFS History: Patient Active Problem List   Diagnosis Date Noted  . Ganglion cyst of wrist, right 08/09/2016  . Environmental allergies 05/19/2016  . Fatigue 01/20/2016  . Snoring 01/20/2016  . ED (erectile dysfunction) 01/20/2016  . Routine general medical examination at a health care facility 01/20/2016  . Hx of abdominal surgery 09/28/2014  . Leukopenia 02/04/2014  . Obesity 06/15/2007  . SARCOIDOSIS 06/11/2007   Past Medical History:  Diagnosis Date  . Depression     Family History  Problem Relation Age of Onset  . Heart disease Mother   . Hyperlipidemia Mother   . Kidney disease Mother   . Diabetes Mother     Past Surgical History:  Procedure Laterality Date  . ABDOMINAL SURGERY     Social History   Occupational History  . Not on file.   Social History Main Topics  . Smoking status: Never Smoker  . Smokeless tobacco: Never Used  . Alcohol use No  . Drug use: No  . Sexual activity: Not on file

## 2016-09-12 ENCOUNTER — Encounter (INDEPENDENT_AMBULATORY_CARE_PROVIDER_SITE_OTHER): Payer: Self-pay | Admitting: Orthopaedic Surgery

## 2016-09-12 ENCOUNTER — Ambulatory Visit (INDEPENDENT_AMBULATORY_CARE_PROVIDER_SITE_OTHER): Payer: Medicare Other | Admitting: Orthopaedic Surgery

## 2016-09-12 DIAGNOSIS — M67431 Ganglion, right wrist: Secondary | ICD-10-CM | POA: Diagnosis not present

## 2016-09-12 NOTE — Progress Notes (Signed)
   Office Visit Note   Patient: Grant Miller           Date of Birth: 1975/09/28           MRN: [id number] Visit Date: 09/12/2016              Requested by: Hoyt Koch, MD [place], [place] [id number] PCP: Hoyt Koch, MD   Assessment & Plan: Visit Diagnoses:  1. Ganglion cyst of wrist, right     Plan: 2 cc of ganglion cyst fluid was aspirated.  Wrist brace for 2 weeks.  Wean as tolerated.  F/u prn.  Follow-Up Instructions: Return if symptoms worsen or fail to improve.   Orders:  No orders of the defined types were placed in this encounter.  No orders of the defined types were placed in this encounter.     Procedures: Hand/UE Inj Date/Time: 09/12/2016 8:34 AM Performed by: Leandrew Koyanagi Authorized by: Leandrew Koyanagi   Indications:  Pain Condition: dorsal carpal ganglion   Site:  R wrist Needle Size:  18 G Approach:  Dorsal     Clinical Data: No additional findings.   Subjective: Chief Complaint  Patient presents with  . Right Wrist - Pain, Follow-up    [name] comes back today for cyst aspiration.  Denies any n/t.    Review of Systems   Objective: Vital Signs: There were no vitals taken for this visit.  Physical Exam  Ortho Exam Right wrist exam is stable Specialty Comments:  No specialty comments available.  Imaging: No results found.   PMFS History: Patient Active Problem List   Diagnosis Date Noted  . Ganglion cyst of wrist, right 08/09/2016  . Environmental allergies 05/19/2016  . Fatigue 01/20/2016  . Snoring 01/20/2016  . ED (erectile dysfunction) 01/20/2016  . Routine general medical examination at a health care facility 01/20/2016  . Hx of abdominal surgery 09/28/2014  . Leukopenia 02/04/2014  . Obesity 06/15/2007  . SARCOIDOSIS 06/11/2007   Past Medical History:  Diagnosis Date  . Depression     Family History  Problem Relation Age of Onset  . Heart disease Mother   . Hyperlipidemia  Mother   . Kidney disease Mother   . Diabetes Mother     Past Surgical History:  Procedure Laterality Date  . ABDOMINAL SURGERY     Social History   Occupational History  . Not on file.   Social History Main Topics  . Smoking status: Never Smoker  . Smokeless tobacco: Never Used  . Alcohol use No  . Drug use: No  . Sexual activity: Not on file

## 2016-10-02 DIAGNOSIS — L910 Hypertrophic scar: Secondary | ICD-10-CM | POA: Diagnosis not present

## 2016-10-02 DIAGNOSIS — D2239 Melanocytic nevi of other parts of face: Secondary | ICD-10-CM | POA: Diagnosis not present

## 2017-02-21 ENCOUNTER — Other Ambulatory Visit: Payer: Medicare Other

## 2017-02-21 ENCOUNTER — Ambulatory Visit: Payer: Medicare Other | Admitting: Oncology

## 2017-03-21 ENCOUNTER — Telehealth: Payer: Self-pay | Admitting: Oncology

## 2017-03-21 ENCOUNTER — Inpatient Hospital Stay: Payer: Medicare Other

## 2017-03-21 ENCOUNTER — Inpatient Hospital Stay: Payer: Medicare Other | Attending: Oncology | Admitting: Oncology

## 2017-03-21 VITALS — BP 119/78 | HR 66 | Temp 98.5°F | Resp 20 | Ht 71.0 in | Wt 248.9 lb

## 2017-03-21 DIAGNOSIS — D709 Neutropenia, unspecified: Secondary | ICD-10-CM

## 2017-03-21 DIAGNOSIS — Z79899 Other long term (current) drug therapy: Secondary | ICD-10-CM | POA: Insufficient documentation

## 2017-03-21 DIAGNOSIS — R5383 Other fatigue: Secondary | ICD-10-CM | POA: Diagnosis not present

## 2017-03-21 DIAGNOSIS — G8929 Other chronic pain: Secondary | ICD-10-CM

## 2017-03-21 DIAGNOSIS — N400 Enlarged prostate without lower urinary tract symptoms: Secondary | ICD-10-CM

## 2017-03-21 DIAGNOSIS — M549 Dorsalgia, unspecified: Secondary | ICD-10-CM | POA: Diagnosis not present

## 2017-03-21 LAB — CBC WITH DIFFERENTIAL/PLATELET
Basophils Absolute: 0 10*3/uL (ref 0.0–0.1)
Basophils Relative: 1 %
EOS PCT: 2 %
Eosinophils Absolute: 0.1 10*3/uL (ref 0.0–0.5)
HCT: 42.2 % (ref 38.4–49.9)
HEMOGLOBIN: 13.7 g/dL (ref 13.0–17.1)
LYMPHS ABS: 2.2 10*3/uL (ref 0.9–3.3)
LYMPHS PCT: 45 %
MCH: 27.4 pg (ref 27.2–33.4)
MCHC: 32.5 g/dL (ref 32.0–36.0)
MCV: 84.4 fL (ref 79.3–98.0)
Monocytes Absolute: 0.4 10*3/uL (ref 0.1–0.9)
Monocytes Relative: 8 %
NEUTROS PCT: 44 %
Neutro Abs: 2.2 10*3/uL (ref 1.5–6.5)
Platelets: 264 10*3/uL (ref 140–400)
RBC: 5 MIL/uL (ref 4.20–5.82)
RDW: 13.6 % (ref 11.0–15.6)
WBC: 4.9 10*3/uL (ref 4.0–10.3)

## 2017-03-21 NOTE — Telephone Encounter (Signed)
Gave avs and calendar for July ° °

## 2017-03-21 NOTE — Progress Notes (Signed)
Hematology and Oncology Follow Up Visit  [facility] [id number] 16-May-1975 42 y.o. 03/21/2017 8:25 AM   Principle Diagnosis: 42 year old man with neutropenia diagnosed in 07/2011.  The etiology is related to benign fluctuating neutropenia.  Lymphoproliferative disorder considered less likely.   Current therapy: Active surveillance.  Interim History: Mr. Grant Miller is here by himself for a follow-up.  He reports no recent complaints since his last visit.  He is trying to eat well and exercise regularly although limited because of his chronic back and abdominal pain issues.  He denies any recent infections or hospitalizations.  He denies any unexplained weight loss or constitutional symptoms.  He is able to ambulate without any difficulties.  He reports chronic abdominal pain that is dull and persistent.  Not associated with any nausea or vomiting.  Not associated with any change in his bowel habits.  He does not require any pain medication for it at this time.  He continues to have issues with excessive fatigue although has not changed since last visit.  He reports he lacks stamina to complete certain tasks predominantly exercising.  He denies any dyspnea on exertion.  He denies any heat or cold intolerance.  He does not report any headaches, blurry vision, syncope or seizures. He had not had any fevers, had not had any chills. He does not report any cough, wheezing or hemoptysis. He does not report any chest pain, palpitation or leg edema. He does not report any nausea, vomiting or early satiety.  He does not report any frequency, urgency or hesitancy. Does not report any genitourinary complaints.  He does not report any skeletal complaints.  Rest or view of system is negative  Medications: I have reviewed the patient's current medications.   Current Outpatient Medications  Medication Sig Dispense Refill  . Diphenhydramine-APAP, sleep, (TYLENOL PM EXTRA STRENGTH PO) Take by mouth.    . fluticasone  (FLONASE) 50 MCG/ACT nasal spray PLACE 2 SPRAYS INTO BOTH NOSTRILS DAILY 16 g 3  . sildenafil (VIAGRA) 100 MG tablet Take 0.5-1 tablets (50-100 mg total) by mouth daily as needed for erectile dysfunction. 5 tablet 11  . tobramycin-dexamethasone (TOBRADEX) ophthalmic solution PLACE 1 DROP INTO BOTH EYES EVEYR 6 HOURS 5 mL 0  . traMADol (ULTRAM) 50 MG tablet Take 1 tablet (50 mg total) by mouth every 8 (eight) hours as needed. 30 tablet 0   No current facility-administered medications for this visit.     Allergies:  Allergies  Allergen Reactions  . Iodine Hives and Shortness Of Breath  . Other Hives and Shortness Of Breath    All seafood causes this reaction    Past Medical History, Surgical history, Social history, and Family History were reviewed and updated.   Physical Exam: Blood pressure 119/78, pulse 66, temperature 98.5 F (36.9 C), temperature source Oral, resp. rate 20, height 5\' 11"  (1.803 m), weight 248 lb 14.4 oz (112.9 kg), SpO2 95 %. ECOG: 0 General appearance: Alert, awake gentleman appeared comfortable. Head: Normocephalic atraumatic. Oral mucosa without thrush. Eyes: No scleral icterus. Lymph nodes: Cervical, supraclavicular, and axillary nodes normal. Heart:regular rate and rhythm, S1, S2 normal, no murmur, click, rub or gallop Lung:chest clear, no wheezing, rales, normal symmetric air entry Abdomin: Tender to touch but no rebound or guarding.  Good bowel sounds auscultated. Skin: No rashes or lesions. Musculoskeletal: No joint deformities or effusion.   Lab Results: Lab Results  Component Value Date   WBC 4.9 03/21/2017   HGB 13.7 03/21/2017   HCT  42.2 03/21/2017   MCV 84.4 03/21/2017   PLT 264 03/21/2017     Chemistry      Component Value Date/Time   NA 140 01/18/2016 1357   NA 142 08/16/2014 0759   K 4.2 01/18/2016 1357   K 3.8 08/16/2014 0759   CL 108 01/18/2016 1357   CO2 26 01/18/2016 1357   CO2 22 08/16/2014 0759   BUN 21 01/18/2016 1357    BUN 19.6 08/16/2014 0759   CREATININE 1.34 01/18/2016 1357   CREATININE 1.2 08/16/2014 0759      Component Value Date/Time   CALCIUM 9.7 01/18/2016 1357   CALCIUM 9.2 08/16/2014 0759   ALKPHOS 48 01/18/2016 1357   ALKPHOS 62 08/16/2014 0759   AST 39 (H) 01/18/2016 1357   AST 35 (H) 08/16/2014 0759   ALT 43 01/18/2016 1357   ALT 36 08/16/2014 0759   BILITOT 0.4 01/18/2016 1357   BILITOT 0.43 08/16/2014 0759       Impression and Plan:   42 year old gentleman with the following issues.   1.  Neutropenia: The etiology is related to benign causes and has been fluctuating.  His white cell count is within normal range at this time.  The differential diagnosis was reviewed today with the patient.  Benign fluctuating neutropenia versus lymphoproliferative disorder among other causes.  Risks and benefits of continued observation at this time was reviewed.  He prefers to continue to monitor this issue and have his labs checked every 6 months.  I have no objections to continue to do so.  2. Health maintenance issues.  We discussed the importance of age-appropriate cancer screening.  He is interested in prostate cancer screening and he is in the process of being referred to urology.  3. Back pain: Chronic in nature and unchanged.  Continues to follow with pain clinic.  4.  Fatigue: Unclear etiology and appears to be multifactorial.  His hemoglobin is adequate and no evidence of any endocrinopathy.  I urged him to follow-up with his primary care physician at this time if his symptoms progress.  I encouraged him to continue with appropriate diet and exercise.  5. Follow-up: 6 months.   15 minutes was spent with the patient face-to-face today.  More than 50% of time was dedicated to patient counseling, education and coordination of the his multifaceted care.    Zola Button, MD 1/18/20198:25 AM

## 2017-04-16 ENCOUNTER — Encounter (HOSPITAL_COMMUNITY): Payer: Self-pay | Admitting: Emergency Medicine

## 2017-04-16 ENCOUNTER — Emergency Department (HOSPITAL_COMMUNITY): Payer: No Typology Code available for payment source

## 2017-04-16 ENCOUNTER — Emergency Department (HOSPITAL_COMMUNITY)
Admission: EM | Admit: 2017-04-16 | Discharge: 2017-04-16 | Disposition: A | Discharge status: Home / Self Care | Payer: No Typology Code available for payment source | Attending: Emergency Medicine | Admitting: Emergency Medicine

## 2017-04-16 ENCOUNTER — Other Ambulatory Visit: Payer: Self-pay

## 2017-04-16 DIAGNOSIS — M545 Low back pain, unspecified: Secondary | ICD-10-CM

## 2017-04-16 DIAGNOSIS — M25512 Pain in left shoulder: Secondary | ICD-10-CM | POA: Diagnosis not present

## 2017-04-16 DIAGNOSIS — Y999 Unspecified external cause status: Secondary | ICD-10-CM | POA: Insufficient documentation

## 2017-04-16 DIAGNOSIS — S4992XA Unspecified injury of left shoulder and upper arm, initial encounter: Secondary | ICD-10-CM | POA: Diagnosis not present

## 2017-04-16 DIAGNOSIS — Y92410 Unspecified street and highway as the place of occurrence of the external cause: Secondary | ICD-10-CM | POA: Diagnosis not present

## 2017-04-16 DIAGNOSIS — S39012A Strain of muscle, fascia and tendon of lower back, initial encounter: Secondary | ICD-10-CM | POA: Diagnosis not present

## 2017-04-16 DIAGNOSIS — T148XXA Other injury of unspecified body region, initial encounter: Secondary | ICD-10-CM

## 2017-04-16 DIAGNOSIS — Z79899 Other long term (current) drug therapy: Secondary | ICD-10-CM | POA: Diagnosis not present

## 2017-04-16 DIAGNOSIS — Y9389 Activity, other specified: Secondary | ICD-10-CM | POA: Insufficient documentation

## 2017-04-16 MED ORDER — ACETAMINOPHEN 500 MG PO TABS
1000.0000 mg | ORAL_TABLET | Freq: Once | ORAL | Status: AC
  Administered 2017-04-16: 1000 mg via ORAL
  Filled 2017-04-16: qty 2

## 2017-04-16 MED ORDER — NAPROXEN 375 MG PO TABS: 375.0000 mg | ORAL_TABLET | Freq: Two times a day (BID) | ORAL | 0 refills | Status: DC

## 2017-04-16 MED ORDER — CYCLOBENZAPRINE HCL 10 MG PO TABS: 10.0000 mg | ORAL_TABLET | Freq: Two times a day (BID) | ORAL | 0 refills | Status: DC | PRN
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
## 2017-04-17 ENCOUNTER — Other Ambulatory Visit (INDEPENDENT_AMBULATORY_CARE_PROVIDER_SITE_OTHER): Payer: Medicare Other

## 2017-04-17 ENCOUNTER — Encounter: Payer: Self-pay | Admitting: Internal Medicine

## 2017-04-17 ENCOUNTER — Ambulatory Visit (INDEPENDENT_AMBULATORY_CARE_PROVIDER_SITE_OTHER): Payer: Medicare Other | Admitting: Internal Medicine

## 2017-04-17 VITALS — BP 110/80 | HR 69 | Temp 97.9°F | Ht 72.0 in | Wt 239.0 lb

## 2017-04-17 DIAGNOSIS — M5441 Lumbago with sciatica, right side: Secondary | ICD-10-CM

## 2017-04-17 DIAGNOSIS — G8929 Other chronic pain: Secondary | ICD-10-CM | POA: Diagnosis not present

## 2017-04-17 DIAGNOSIS — M5442 Lumbago with sciatica, left side: Secondary | ICD-10-CM

## 2017-04-17 DIAGNOSIS — E785 Hyperlipidemia, unspecified: Secondary | ICD-10-CM

## 2017-04-17 DIAGNOSIS — M25512 Pain in left shoulder: Secondary | ICD-10-CM | POA: Diagnosis not present

## 2017-04-17 DIAGNOSIS — M67431 Ganglion, right wrist: Secondary | ICD-10-CM | POA: Diagnosis not present

## 2017-04-17 LAB — LIPID PANEL
CHOLESTEROL: 162 mg/dL (ref 0–200)
HDL: 30.3 mg/dL — AB (ref >39.00)
LDL CALC: 119 mg/dL — AB (ref 0–99)
NONHDL: 131.66
Total CHOL/HDL Ratio: 5
Triglycerides: 65 mg/dL (ref 0.0–149.0)
VLDL: 13 mg/dL (ref 0.0–40.0)

## 2017-04-17 LAB — COMPREHENSIVE METABOLIC PANEL
ALT: 27 U/L (ref 0–53)
AST: 31 U/L (ref 0–37)
Albumin: 4.9 g/dL (ref 3.5–5.2)
Alkaline Phosphatase: 39 U/L (ref 39–117)
BUN: 16 mg/dL (ref 6–23)
CHLORIDE: 104 meq/L (ref 96–112)
CO2: 26 mEq/L (ref 19–32)
Calcium: 9.9 mg/dL (ref 8.4–10.5)
Creatinine, Ser: 1.26 mg/dL (ref 0.40–1.50)
GFR: 80.22 mL/min (ref >60.00)
GLUCOSE: 101 mg/dL — AB (ref 70–99)
POTASSIUM: 3.9 meq/L (ref 3.5–5.1)
SODIUM: 139 meq/L (ref 135–145)
Total Bilirubin: 0.6 mg/dL (ref 0.2–1.2)
Total Protein: 8 g/dL (ref 6.0–8.3)

## 2017-04-17 LAB — CBC
HEMATOCRIT: 43.2 % (ref 39.0–52.0)
Hemoglobin: 14.2 g/dL (ref 13.0–17.0)
MCHC: 32.9 g/dL (ref 30.0–36.0)
MCV: 84.5 fl (ref 78.0–100.0)
Platelets: 241 10*3/uL (ref 150.0–400.0)
RBC: 5.12 Mil/uL (ref 4.22–5.81)
RDW: 13.8 % (ref 11.5–15.5)
WBC: 4.1 10*3/uL (ref 4.0–10.5)

## 2017-04-17 MED ORDER — TRAMADOL HCL 50 MG PO TABS: 50.0000 mg | ORAL_TABLET | Freq: Three times a day (TID) | ORAL | 0 refills | Status: DC | PRN
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
## 2017-04-17 NOTE — Assessment & Plan Note (Signed)
Some widening of the [facility] joint stable on x-ray in ER yesterday which is reviewed with the patient. He is taking otc medications naproxen for pain and will continue. Offered PT and referral to orthopedics which he accepted.
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
## 2017-04-17 NOTE — Assessment & Plan Note (Signed)
Referral back to orthopedics for consideration of drainage versus excision.

## 2017-04-17 NOTE — Patient Instructions (Signed)
We will get you in to physical therapy and the orthopedic doctor.   We have sent in the tramadol to use for the pain and the flexeril to use.   We will check the labs today.

## 2017-04-21 DIAGNOSIS — M545 Low back pain: Secondary | ICD-10-CM | POA: Diagnosis not present

## 2017-04-21 DIAGNOSIS — M25512 Pain in left shoulder: Secondary | ICD-10-CM | POA: Diagnosis not present

## 2017-04-29 DIAGNOSIS — M542 Cervicalgia: Secondary | ICD-10-CM | POA: Diagnosis not present

## 2017-04-29 DIAGNOSIS — M545 Low back pain: Secondary | ICD-10-CM | POA: Diagnosis not present

## 2017-04-29 DIAGNOSIS — M5137 Other intervertebral disc degeneration, lumbosacral region: Secondary | ICD-10-CM | POA: Diagnosis not present

## 2017-04-30 DIAGNOSIS — M5137 Other intervertebral disc degeneration, lumbosacral region: Secondary | ICD-10-CM | POA: Diagnosis not present

## 2017-04-30 DIAGNOSIS — M545 Low back pain: Secondary | ICD-10-CM | POA: Diagnosis not present

## 2017-04-30 DIAGNOSIS — M542 Cervicalgia: Secondary | ICD-10-CM | POA: Diagnosis not present

## 2017-05-06 DIAGNOSIS — M5137 Other intervertebral disc degeneration, lumbosacral region: Secondary | ICD-10-CM | POA: Diagnosis not present

## 2017-05-06 DIAGNOSIS — M542 Cervicalgia: Secondary | ICD-10-CM | POA: Diagnosis not present

## 2017-05-06 DIAGNOSIS — M545 Low back pain: Secondary | ICD-10-CM | POA: Diagnosis not present

## 2017-05-08 DIAGNOSIS — M5137 Other intervertebral disc degeneration, lumbosacral region: Secondary | ICD-10-CM | POA: Diagnosis not present

## 2017-05-08 DIAGNOSIS — M545 Low back pain: Secondary | ICD-10-CM | POA: Diagnosis not present

## 2017-05-08 DIAGNOSIS — M542 Cervicalgia: Secondary | ICD-10-CM | POA: Diagnosis not present

## 2017-05-13 DIAGNOSIS — M542 Cervicalgia: Secondary | ICD-10-CM | POA: Diagnosis not present

## 2017-05-13 DIAGNOSIS — M545 Low back pain: Secondary | ICD-10-CM | POA: Diagnosis not present

## 2017-05-13 DIAGNOSIS — M5137 Other intervertebral disc degeneration, lumbosacral region: Secondary | ICD-10-CM | POA: Diagnosis not present

## 2017-05-15 DIAGNOSIS — M545 Low back pain: Secondary | ICD-10-CM | POA: Diagnosis not present

## 2017-05-15 DIAGNOSIS — M5137 Other intervertebral disc degeneration, lumbosacral region: Secondary | ICD-10-CM | POA: Diagnosis not present

## 2017-05-15 DIAGNOSIS — M542 Cervicalgia: Secondary | ICD-10-CM | POA: Diagnosis not present

## 2017-05-19 DIAGNOSIS — M542 Cervicalgia: Secondary | ICD-10-CM | POA: Diagnosis not present

## 2017-05-19 DIAGNOSIS — M545 Low back pain: Secondary | ICD-10-CM | POA: Diagnosis not present

## 2017-05-20 DIAGNOSIS — M5137 Other intervertebral disc degeneration, lumbosacral region: Secondary | ICD-10-CM | POA: Diagnosis not present

## 2017-05-20 DIAGNOSIS — M545 Low back pain: Secondary | ICD-10-CM | POA: Diagnosis not present

## 2017-05-20 DIAGNOSIS — M542 Cervicalgia: Secondary | ICD-10-CM | POA: Diagnosis not present

## 2017-05-22 DIAGNOSIS — M5137 Other intervertebral disc degeneration, lumbosacral region: Secondary | ICD-10-CM | POA: Diagnosis not present

## 2017-05-22 DIAGNOSIS — M545 Low back pain: Secondary | ICD-10-CM | POA: Diagnosis not present

## 2017-05-22 DIAGNOSIS — M542 Cervicalgia: Secondary | ICD-10-CM | POA: Diagnosis not present

## 2017-05-27 DIAGNOSIS — M5137 Other intervertebral disc degeneration, lumbosacral region: Secondary | ICD-10-CM | POA: Diagnosis not present

## 2017-05-27 DIAGNOSIS — M542 Cervicalgia: Secondary | ICD-10-CM | POA: Diagnosis not present

## 2017-05-27 DIAGNOSIS — M545 Low back pain: Secondary | ICD-10-CM | POA: Diagnosis not present

## 2017-05-29 DIAGNOSIS — M542 Cervicalgia: Secondary | ICD-10-CM | POA: Diagnosis not present

## 2017-05-29 DIAGNOSIS — M545 Low back pain: Secondary | ICD-10-CM | POA: Diagnosis not present

## 2017-05-29 DIAGNOSIS — M5137 Other intervertebral disc degeneration, lumbosacral region: Secondary | ICD-10-CM | POA: Diagnosis not present

## 2017-06-03 DIAGNOSIS — M545 Low back pain: Secondary | ICD-10-CM | POA: Diagnosis not present

## 2017-06-03 DIAGNOSIS — M5137 Other intervertebral disc degeneration, lumbosacral region: Secondary | ICD-10-CM | POA: Diagnosis not present

## 2017-06-03 DIAGNOSIS — M542 Cervicalgia: Secondary | ICD-10-CM | POA: Diagnosis not present

## 2017-06-05 DIAGNOSIS — M542 Cervicalgia: Secondary | ICD-10-CM | POA: Diagnosis not present

## 2017-06-05 DIAGNOSIS — M5137 Other intervertebral disc degeneration, lumbosacral region: Secondary | ICD-10-CM | POA: Diagnosis not present

## 2017-06-05 DIAGNOSIS — M545 Low back pain: Secondary | ICD-10-CM | POA: Diagnosis not present

## 2017-06-09 DIAGNOSIS — M545 Low back pain: Secondary | ICD-10-CM | POA: Diagnosis not present

## 2017-06-09 DIAGNOSIS — M5137 Other intervertebral disc degeneration, lumbosacral region: Secondary | ICD-10-CM | POA: Diagnosis not present

## 2017-06-09 DIAGNOSIS — M542 Cervicalgia: Secondary | ICD-10-CM | POA: Diagnosis not present

## 2017-06-12 DIAGNOSIS — M5137 Other intervertebral disc degeneration, lumbosacral region: Secondary | ICD-10-CM | POA: Diagnosis not present

## 2017-06-12 DIAGNOSIS — M542 Cervicalgia: Secondary | ICD-10-CM | POA: Diagnosis not present

## 2017-06-12 DIAGNOSIS — M545 Low back pain: Secondary | ICD-10-CM | POA: Diagnosis not present

## 2017-06-16 DIAGNOSIS — M5137 Other intervertebral disc degeneration, lumbosacral region: Secondary | ICD-10-CM | POA: Diagnosis not present

## 2017-06-16 DIAGNOSIS — M542 Cervicalgia: Secondary | ICD-10-CM | POA: Diagnosis not present

## 2017-06-16 DIAGNOSIS — M545 Low back pain: Secondary | ICD-10-CM | POA: Diagnosis not present

## 2017-06-17 DIAGNOSIS — M545 Low back pain: Secondary | ICD-10-CM | POA: Diagnosis not present

## 2017-06-17 DIAGNOSIS — M5137 Other intervertebral disc degeneration, lumbosacral region: Secondary | ICD-10-CM | POA: Diagnosis not present

## 2017-06-17 DIAGNOSIS — M542 Cervicalgia: Secondary | ICD-10-CM | POA: Diagnosis not present

## 2017-09-12 DIAGNOSIS — L910 Hypertrophic scar: Secondary | ICD-10-CM | POA: Diagnosis not present

## 2017-09-18 ENCOUNTER — Inpatient Hospital Stay: Payer: Medicare Other | Attending: Oncology | Admitting: Oncology

## 2017-09-18 ENCOUNTER — Telehealth: Payer: Self-pay

## 2017-09-18 ENCOUNTER — Inpatient Hospital Stay: Payer: Medicare Other

## 2017-09-18 VITALS — BP 137/77 | HR 64 | Temp 98.1°F | Resp 17 | Ht 72.0 in | Wt 228.6 lb

## 2017-09-18 DIAGNOSIS — G8929 Other chronic pain: Secondary | ICD-10-CM | POA: Diagnosis not present

## 2017-09-18 DIAGNOSIS — R5383 Other fatigue: Secondary | ICD-10-CM | POA: Insufficient documentation

## 2017-09-18 DIAGNOSIS — D709 Neutropenia, unspecified: Secondary | ICD-10-CM | POA: Diagnosis not present

## 2017-09-18 DIAGNOSIS — M549 Dorsalgia, unspecified: Secondary | ICD-10-CM | POA: Insufficient documentation

## 2017-09-18 DIAGNOSIS — Z79899 Other long term (current) drug therapy: Secondary | ICD-10-CM

## 2017-09-18 LAB — CBC WITH DIFFERENTIAL (CANCER CENTER ONLY)
BASOS PCT: 0 %
Basophils Absolute: 0 10*3/uL (ref 0.0–0.1)
EOS ABS: 0.1 10*3/uL (ref 0.0–0.5)
EOS PCT: 1 %
HCT: 41.4 % (ref 38.4–49.9)
Hemoglobin: 13.6 g/dL (ref 13.0–17.1)
Lymphocytes Relative: 47 %
Lymphs Abs: 2 10*3/uL (ref 0.9–3.3)
MCH: 27.3 pg (ref 27.2–33.4)
MCHC: 32.9 g/dL (ref 32.0–36.0)
MCV: 83.1 fL (ref 79.3–98.0)
MONO ABS: 0.3 10*3/uL (ref 0.1–0.9)
MONOS PCT: 6 %
NEUTROS PCT: 46 %
Neutro Abs: 1.9 10*3/uL (ref 1.5–6.5)
PLATELETS: 232 10*3/uL (ref 140–400)
RBC: 4.98 MIL/uL (ref 4.20–5.82)
RDW: 13.5 % (ref 11.0–14.6)
WBC Count: 4.2 10*3/uL (ref 4.0–10.3)
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
## 2017-09-18 NOTE — Telephone Encounter (Signed)
Printed avs and calender of upcoming appointment. Per 7/18 los 

## 2017-10-20 ENCOUNTER — Ambulatory Visit (INDEPENDENT_AMBULATORY_CARE_PROVIDER_SITE_OTHER): Payer: Medicare Other | Admitting: Internal Medicine

## 2017-10-20 ENCOUNTER — Other Ambulatory Visit (INDEPENDENT_AMBULATORY_CARE_PROVIDER_SITE_OTHER): Payer: Medicare Other

## 2017-10-20 ENCOUNTER — Encounter: Payer: Self-pay | Admitting: Internal Medicine

## 2017-10-20 VITALS — BP 126/80 | HR 61 | Temp 98.7°F | Ht 72.0 in | Wt 228.0 lb

## 2017-10-20 DIAGNOSIS — R5383 Other fatigue: Secondary | ICD-10-CM

## 2017-10-20 DIAGNOSIS — E538 Deficiency of other specified B group vitamins: Secondary | ICD-10-CM

## 2017-10-20 DIAGNOSIS — R7301 Impaired fasting glucose: Secondary | ICD-10-CM

## 2017-10-20 DIAGNOSIS — M79671 Pain in right foot: Secondary | ICD-10-CM | POA: Diagnosis not present

## 2017-10-20 LAB — COMPREHENSIVE METABOLIC PANEL
ALBUMIN: 4.6 g/dL (ref 3.5–5.2)
ALT: 34 U/L (ref 0–53)
AST: 32 U/L (ref 0–37)
Alkaline Phosphatase: 38 U/L — ABNORMAL LOW (ref 39–117)
BILIRUBIN TOTAL: 0.5 mg/dL (ref 0.2–1.2)
BUN: 19 mg/dL (ref 6–23)
CALCIUM: 10.3 mg/dL (ref 8.4–10.5)
CO2: 26 mEq/L (ref 19–32)
Chloride: 107 mEq/L (ref 96–112)
Creatinine, Ser: 1.23 mg/dL (ref 0.40–1.50)
GFR: 82.29 mL/min (ref >60.00)
Glucose, Bld: 97 mg/dL (ref 70–99)
Potassium: 4.1 mEq/L (ref 3.5–5.1)
Sodium: 141 mEq/L (ref 135–145)
Total Protein: 7.4 g/dL (ref 6.0–8.3)

## 2017-10-20 LAB — VITAMIN B12: Vitamin B-12: 1022 pg/mL — ABNORMAL HIGH (ref 211–911)

## 2017-10-20 LAB — CBC
HEMATOCRIT: 41.1 % (ref 39.0–52.0)
HEMOGLOBIN: 13.4 g/dL (ref 13.0–17.0)
MCHC: 32.6 g/dL (ref 30.0–36.0)
MCV: 83 fl (ref 78.0–100.0)
PLATELETS: 240 10*3/uL (ref 150.0–400.0)
RBC: 4.94 Mil/uL (ref 4.22–5.81)
RDW: 13.6 % (ref 11.5–15.5)
WBC: 4 10*3/uL (ref 4.0–10.5)

## 2017-10-20 LAB — TSH: TSH: 0.5 u[IU]/mL (ref 0.35–4.50)

## 2017-10-20 LAB — HEMOGLOBIN A1C: Hgb A1c MFr Bld: 6.4 % (ref 4.6–6.5)

## 2017-10-20 NOTE — Assessment & Plan Note (Signed)
Checking HgA1c as last was 6.2. We need routine monitoring for any progression to diabetes.

## 2017-10-20 NOTE — Patient Instructions (Signed)
We will check the labs today and get you in with the foot specialist.

## 2017-10-20 NOTE — Assessment & Plan Note (Signed)
He wants something for energy. Checking B12, CBC, CMP, HgA1c, TSH today and adjust as needed. Talked about doing some regular exercise of walking, weights while seated if he cannot walk to help with energy levels.

## 2017-10-20 NOTE — Progress Notes (Signed)
   Subjective:    Patient ID: Grant Miller, male    DOB: 1975-11-20, 42 y.o.   MRN: [id number]  HPI The patient is a 42 YO man coming in for foot pain (prior plantar fasciitis and saw podiatry for shots and custom inserts, this was about 10 years ago, he has had recurrence after he was walking more in the last several weeks, pain 7/10 with walking, not hurting much with sitting, can do elliptical without pain, overall is stable since onset) and follow up of pre-diabetes (needs HgA1c, worried that his sugars are increasing, denies new numbness or tingling, denies weight change or diet change) and fatigue (he is sleeping a lot during the day, on disability and does not work, some stomach pain chronic, denies weight change or diet change, sleeps well at night, denies depression symptoms). He also has questions about if he can get some of his stomach fat taken off through insurance or if we have advice about that.   Review of Systems  Constitutional: Positive for activity change and fatigue. Negative for appetite change.  HENT: Negative.   Eyes: Negative.   Respiratory: Negative for cough, chest tightness and shortness of breath.   Cardiovascular: Negative for chest pain, palpitations and leg swelling.  Gastrointestinal: Negative for abdominal distention, abdominal pain, constipation, diarrhea, nausea and vomiting.  Musculoskeletal: Positive for arthralgias, gait problem and myalgias.  Skin: Negative.   Neurological: Negative for dizziness, tremors, syncope, weakness, light-headedness and headaches.  Psychiatric/Behavioral: Negative.       Objective:   Physical Exam  Constitutional: He is oriented to person, place, and time. He appears well-developed and well-nourished.  HENT:  Head: Normocephalic and atraumatic.  Eyes: EOM are normal.  Neck: Normal range of motion.  Cardiovascular: Normal rate and regular rhythm.  Pulmonary/Chest: Effort normal and breath sounds normal. No respiratory  distress. He has no wheezes. He has no rales.  Abdominal: Soft. Bowel sounds are normal. He exhibits no distension. There is no tenderness. There is no rebound.  Scarring stable  Musculoskeletal: He exhibits tenderness. He exhibits no edema.  Pain in the heel right and along the plantar surface  Neurological: He is alert and oriented to person, place, and time. Coordination normal.  Skin: Skin is warm and dry.  Psychiatric: He has a normal mood and affect.   Vitals:   10/20/17 0844  BP: 126/80  Pulse: 61  Temp: 98.7 F (37.1 C)  TempSrc: Oral  SpO2: 97%  Weight: 228 lb (103.4 kg)  Height: 6' (1.829 m)      Assessment & Plan:

## 2017-10-20 NOTE — Assessment & Plan Note (Signed)
Referral to podiatry to help with his pain. We talked about shoes for support and stretching. Likely needs repeat injections and adjustment of custom insoles.

## 2017-10-24 ENCOUNTER — Emergency Department (HOSPITAL_COMMUNITY)
Admission: EM | Admit: 2017-10-24 | Discharge: 2017-10-24 | Disposition: A | Discharge status: Home / Self Care | Payer: Medicare Other | Attending: Emergency Medicine | Admitting: Emergency Medicine

## 2017-10-24 ENCOUNTER — Encounter (HOSPITAL_COMMUNITY): Payer: Self-pay | Admitting: *Deleted

## 2017-10-24 DIAGNOSIS — S00452A Superficial foreign body of left ear, initial encounter: Secondary | ICD-10-CM | POA: Diagnosis not present

## 2017-10-24 DIAGNOSIS — Y999 Unspecified external cause status: Secondary | ICD-10-CM | POA: Diagnosis not present

## 2017-10-24 DIAGNOSIS — Y939 Activity, unspecified: Secondary | ICD-10-CM | POA: Diagnosis not present

## 2017-10-24 DIAGNOSIS — T162XXA Foreign body in left ear, initial encounter: Secondary | ICD-10-CM | POA: Diagnosis not present

## 2017-10-24 DIAGNOSIS — X58XXXA Exposure to other specified factors, initial encounter: Secondary | ICD-10-CM | POA: Insufficient documentation

## 2017-10-24 DIAGNOSIS — Y929 Unspecified place or not applicable: Secondary | ICD-10-CM | POA: Diagnosis not present

## 2017-10-24 DIAGNOSIS — Z79899 Other long term (current) drug therapy: Secondary | ICD-10-CM | POA: Insufficient documentation

## 2017-10-24 MED ORDER — CEPHALEXIN 500 MG PO CAPS: 500.0000 mg | ORAL_CAPSULE | Freq: Four times a day (QID) | ORAL | 0 refills | Status: DC

## 2017-10-24 MED ORDER — BACITRACIN ZINC 500 UNIT/GM EX OINT
TOPICAL_OINTMENT | CUTANEOUS | Status: AC
  Administered 2017-10-24: 1
  Filled 2017-10-24: qty 0.9

## 2017-10-24 NOTE — ED Triage Notes (Signed)
Pt has the back clip of his earring stuck in the back of his ear for the past 2 days. Pt noticed some drainage.

## 2017-10-24 NOTE — Discharge Instructions (Signed)
Check with your primary doctor if you tetanus is up to date. You deffered updating this today.  You were seen here today for a earring that could not be removed. This was removed in the department.  Please follow wound care instructions as above.  If you develop worsening or new concerning symptoms you can return to the emergency department for re-evaluation.

## 2017-10-24 NOTE — ED Provider Notes (Signed)
[facility] Provider Note   CSN: [id number] Arrival date & time: 10/24/17  0806     History   Chief Complaint Chief Complaint  Patient presents with  . Foreign Body in Ear    ear lobe    HPI Grant Miller is a 42 y.o. male who presents emergency department today for earring that is stuck in his left earlobe. Patient reports that he had his ears pierced in February of this year and is not taking his earrings out since that time.  He notes that the earrings are now stuck in place and cannot be removed.  He is try to do this himself at home without any success.  He denies any associated pain.  He denies any fever, nausea, vomiting, surrounding swelling, erythema.  He has not taken anything for symptoms.  He notes nothing makes his symptoms better or worse.  He is unsure of his last tetanus.  HPI  Past Medical History:  Diagnosis Date  . Depression     Patient Active Problem List   Diagnosis Date Noted  . Impaired fasting blood sugar 10/20/2017  . Right foot pain 10/20/2017  . Acute bilateral low back pain with bilateral sciatica 04/17/2017  . Left shoulder pain 04/17/2017  . Ganglion cyst of wrist, right 08/09/2016  . Environmental allergies 05/19/2016  . Fatigue 01/20/2016  . Snoring 01/20/2016  . ED (erectile dysfunction) 01/20/2016  . Routine general medical examination at a health care facility 01/20/2016  . Hx of abdominal surgery 09/28/2014  . Leukopenia 02/04/2014  . Obesity 06/15/2007  . SARCOIDOSIS 06/11/2007    Past Surgical History:  Procedure Laterality Date  . ABDOMINAL SURGERY          Home Medications    Prior to Admission medications   Medication Sig Start Date End Date Taking? Authorizing Provider  acetaminophen (TYLENOL) 500 MG tablet Take by mouth.    [provider]  cyclobenzaprine (FLEXERIL) 10 MG tablet Take 1 tablet (10 mg total) by mouth 2 (two) times daily as needed for muscle spasms. 04/16/17    Providence Lanius A, PA-C  Diphenhydramine-APAP, sleep, (TYLENOL PM EXTRA STRENGTH PO) Take by mouth.    [provider]  fluticasone (FLONASE) 50 MCG/ACT nasal spray PLACE 2 SPRAYS INTO BOTH NOSTRILS DAILY 05/20/16   Einar Pheasant, MD  naproxen (NAPROSYN) 375 MG tablet Take 1 tablet (375 mg total) by mouth 2 (two) times daily. 04/16/17   Volanda Napoleon, PA-C  sildenafil (VIAGRA) 100 MG tablet Take 0.5-1 tablets (50-100 mg total) by mouth daily as needed for erectile dysfunction. 01/18/16   Hoyt Koch, MD  traMADol (ULTRAM) 50 MG tablet Take 1 tablet (50 mg total) by mouth every 8 (eight) hours as needed. 04/17/17   Hoyt Koch, MD    Family History Family History  Problem Relation Age of Onset  . Heart disease Mother   . Hyperlipidemia Mother   . Kidney disease Mother   . Diabetes Mother     Social History Social History   Tobacco Use  . Smoking status: Never Smoker  . Smokeless tobacco: Never Used  Substance Use Topics  . Alcohol use: No  . Drug use: No     Allergies   Iodine and Other   Review of Systems Review of Systems  Constitutional: Negative for fever.  HENT: Negative for congestion, ear discharge, ear pain and sinus pain.        Earring in left earlobe  Gastrointestinal:  Negative for nausea and vomiting.  Neurological: Negative for headaches.     Physical Exam Updated Vital Signs BP (!) 154/76 (BP Location: Right Arm)   Pulse 69   Temp 98.4 F (36.9 C) (Oral)   Resp 18   SpO2 98%   Physical Exam  Constitutional: He appears well-developed and well-nourished.  HENT:  Head: Normocephalic and atraumatic.  Right Ear: External ear normal.  Left Ear: External ear normal.  Patient with left earring in place. The backing of the left earring is enclosed by the posterior earlobe. No surrounding signs of infection. No drainage.   Eyes: Conjunctivae are normal. Right eye exhibits no discharge. Left eye exhibits no discharge. No  scleral icterus.  Pulmonary/Chest: Effort normal. No respiratory distress.  Neurological: He is alert.  Skin: No pallor.  Psychiatric: He has a normal mood and affect.  Nursing note and vitals reviewed.    ED Treatments / Results  Labs (all labs ordered are listed, but only abnormal results are displayed) Labs Reviewed - No data to display  EKG None  Radiology No results found.  Procedures .Foreign Body Removal Date/Time: 10/24/2017 8:58 AM Performed by: Jillyn Ledger, PA-C Authorized by: Jillyn Ledger, PA-C  Consent: Verbal consent obtained. Risks and benefits: risks, benefits and alternatives were discussed Consent given by: patient Patient understanding: patient states understanding of the procedure being performed Site marked: the operative site was marked Patient identity confirmed: verbally with patient Time out: Immediately prior to procedure a "time out" was called to verify the correct patient, procedure, equipment, support staff and site/side marked as required. Body area: ear Location details: left ear Patient restrained: no Localization method: visualized Removal mechanism: forceps Complexity: simple 1 objects recovered. Objects recovered: earring Post-procedure assessment: foreign body removed Patient tolerance: Patient tolerated the procedure well with no immediate complications   (including critical care time)  Medications Ordered in ED Medications - No data to display   Initial Impression / Assessment and Plan / ED Course  I have reviewed the triage vital signs and the nursing notes.  Pertinent labs & imaging results that were available during my care of the patient were reviewed by me and considered in my medical decision making (see chart for details).     42 y.o. male with left earring stuck. This was removed in the department. He is unsure of last tetanus. Offered to updated here in department. He would like to check with his PCP and  defer at this time. He understands he can return at anytime to have this updated. Wound care discussed. Return precautions discussed. He appears safe for discharge.   Final Clinical Impressions(s) / ED Diagnoses   Final diagnoses:  Foreign body of left ear lobe, initial encounter    ED Discharge Orders    None       Lorelle Gibbs 10/24/17 [place], MD 10/24/17 [phone]

## 2017-10-30 ENCOUNTER — Encounter: Payer: Self-pay | Admitting: Podiatry

## 2017-10-30 ENCOUNTER — Other Ambulatory Visit: Payer: Self-pay | Admitting: Podiatry

## 2017-10-30 ENCOUNTER — Ambulatory Visit (INDEPENDENT_AMBULATORY_CARE_PROVIDER_SITE_OTHER): Payer: Medicare Other

## 2017-10-30 ENCOUNTER — Ambulatory Visit (INDEPENDENT_AMBULATORY_CARE_PROVIDER_SITE_OTHER): Payer: Medicare Other | Admitting: Podiatry

## 2017-10-30 VITALS — BP 119/69 | HR 65

## 2017-10-30 DIAGNOSIS — M722 Plantar fascial fibromatosis: Secondary | ICD-10-CM

## 2017-10-30 DIAGNOSIS — M79671 Pain in right foot: Secondary | ICD-10-CM

## 2017-10-30 DIAGNOSIS — M79672 Pain in left foot: Secondary | ICD-10-CM | POA: Diagnosis not present

## 2017-10-30 MED ORDER — TRIAMCINOLONE ACETONIDE 10 MG/ML IJ SUSP
10.0000 mg | Freq: Once | INTRAMUSCULAR | Status: AC
  Administered 2017-10-30: 10 mg

## 2017-10-30 NOTE — Progress Notes (Signed)
Subjective:   Patient ID: Grant Miller, male   DOB: 42 y.o.   MRN: [id number]   HPI Patient presents with significant plantar fascial pain right over left and states they both bother her but the right is been worse.  Patient does not smoke likes to be active but cannot because of the heel pain   Review of Systems  All other systems reviewed and are negative.       Objective:  Physical Exam  Constitutional: He appears well-developed and well-nourished.  Cardiovascular: Intact distal pulses.  Pulmonary/Chest: Effort normal.  Musculoskeletal: Normal range of motion.  Neurological: He is alert.  Skin: Skin is warm.  Nursing note and vitals reviewed.   Neurovascular status found to be intact muscle strength is adequate range of motion within normal limits with patient noted to have exquisite discomfort in the heel right over left with inflammation fluid around the medial band.  Patient is noted to have good digital perfusion is well oriented x3 and has moderate depression of the arch and has history of orthotics     Assessment:  Acute plantar fasciitis right over left heel with inflammation fluid around the medial band     Plan:  H&P and condition discussed with patient and at this point I went ahead and injected the plantar fascia right and left 3 mg Kenalog 5 mg Xylocaine and applied fascial brace bilateral placed on oral anti-inflammatory.  Discussed utilization long-term of orthotics and will look at making these at next visit  X-rays indicate small spur formation with no indications of stress fracture arthritis

## 2017-10-30 NOTE — Patient Instructions (Signed)

## 2017-11-13 ENCOUNTER — Encounter: Payer: Self-pay | Admitting: Podiatry

## 2017-11-13 ENCOUNTER — Ambulatory Visit (INDEPENDENT_AMBULATORY_CARE_PROVIDER_SITE_OTHER): Payer: Medicare Other | Admitting: Podiatry

## 2017-11-13 DIAGNOSIS — M722 Plantar fascial fibromatosis: Secondary | ICD-10-CM | POA: Diagnosis not present

## 2017-11-13 MED ORDER — TRIAMCINOLONE ACETONIDE 10 MG/ML IJ SUSP
10.0000 mg | Freq: Once | INTRAMUSCULAR | Status: AC
  Administered 2017-11-13: 10 mg

## 2017-11-13 NOTE — Progress Notes (Signed)
Subjective:   Patient ID: Grant Miller, male   DOB: 42 y.o.   MRN: [id number]   HPI Patient presents stating my left heel feels quite a bit better but my right heel still hurts and it is especially bad after sitting and when I get up in the morning and feels like it needs to be stretched better.  Still having sharp pain   ROS      Objective:  Physical Exam  Neurovascular status intact with significant diminishment of discomfort left over right heel with an area of acute discomfort in the right plantar fascia distal to its insertion into the calcaneus is noted     Assessment:  Improvement in fasciitis with continued acute discomfort right plantar fascia     Plan:  I recommended the importance of stretching I placed him into a night splint with instructions on sleeping at it and using it for the left foot in the evenings.  Also will initiate ice therapy and I reinjected the plantar fascia today 3 mg Kenalog 5 mg Xylocaine and will be seen back 4 weeks and I also discussed shoe gear modification

## 2018-02-03 ENCOUNTER — Encounter: Payer: Self-pay | Admitting: Internal Medicine

## 2018-02-03 ENCOUNTER — Ambulatory Visit (INDEPENDENT_AMBULATORY_CARE_PROVIDER_SITE_OTHER): Payer: Medicare Other | Admitting: Internal Medicine

## 2018-02-03 VITALS — BP 110/70 | HR 75 | Temp 98.2°F | Ht 72.0 in | Wt 235.0 lb

## 2018-02-03 DIAGNOSIS — G8929 Other chronic pain: Secondary | ICD-10-CM | POA: Diagnosis not present

## 2018-02-03 DIAGNOSIS — R350 Frequency of micturition: Secondary | ICD-10-CM | POA: Diagnosis not present

## 2018-02-03 DIAGNOSIS — M545 Low back pain, unspecified: Secondary | ICD-10-CM | POA: Insufficient documentation

## 2018-02-03 DIAGNOSIS — M25512 Pain in left shoulder: Secondary | ICD-10-CM

## 2018-02-03 LAB — POCT URINALYSIS DIPSTICK
BILIRUBIN UA: NEGATIVE
Blood, UA: NEGATIVE
GLUCOSE UA: NEGATIVE
Ketones, UA: NEGATIVE
Leukocytes, UA: NEGATIVE
NITRITE UA: NEGATIVE
Protein, UA: NEGATIVE
Spec Grav, UA: 1.02 (ref 1.010–1.025)
Urobilinogen, UA: 0.2 E.U./dL
pH, UA: 6 (ref 5.0–8.0)

## 2018-02-03 NOTE — Assessment & Plan Note (Signed)
Refer to PT to help with pain. No new injury and no new need for imaging.

## 2018-02-03 NOTE — Progress Notes (Signed)
   Subjective:    Patient ID: Grant Miller, male    DOB: 16-Feb-1976, 42 y.o.   MRN: [id number]  HPI The patient is a 42 YO man coming in for frequency with urination (started in the last couple of weeks, denies burning with going, denies discharge or sore or lesion on genitals, denies fevers or chills, denies drinking more fluids, is able to make it to the bathroom). He is also having some low back pain (chronic but worse recently, denies recent injury or fall, denies fevers or chills, denies new numbness or weakness in his legs, did pt in the spring which helped some, taking medications as prescribed). He is also having left shoulder pain (chronic and stable overall, he is wondering if PT might help this also, does have limited ROM in the shoulder, denies numbness or weakness, neck not much pain, denies headaches, denies recent injury or overuse).    Review of Systems  Constitutional: Positive for activity change. Negative for appetite change, fatigue, fever and unexpected weight change.  HENT: Negative.   Eyes: Negative.   Respiratory: Negative.  Negative for cough, chest tightness and shortness of breath.   Cardiovascular: Negative.  Negative for chest pain, palpitations and leg swelling.  Gastrointestinal: Negative for abdominal distention, abdominal pain, constipation, diarrhea, nausea and vomiting.  Genitourinary: Positive for frequency and urgency. Negative for decreased urine volume, difficulty urinating, discharge, dysuria, flank pain, hematuria, penile pain, penile swelling, scrotal swelling and testicular pain.  Musculoskeletal: Positive for arthralgias, back pain and myalgias. Negative for neck pain and neck stiffness.  Skin: Negative.   Neurological: Negative.  Negative for syncope, weakness and numbness.  Psychiatric/Behavioral: Negative.       Objective:   Physical Exam  Constitutional: He is oriented to person, place, and time. He appears well-developed and well-nourished.    HENT:  Head: Normocephalic and atraumatic.  Eyes: EOM are normal.  Neck: Normal range of motion.  Cardiovascular: Normal rate and regular rhythm.  Pulmonary/Chest: Effort normal and breath sounds normal. No respiratory distress. He has no wheezes. He has no rales.  Abdominal: Soft. Bowel sounds are normal. He exhibits no distension. There is no tenderness. There is no rebound.  Genitourinary:  Genitourinary Comments: Declined GU exam  Musculoskeletal: He exhibits tenderness. He exhibits no edema.  Neurological: He is alert and oriented to person, place, and time. Coordination normal.  Skin: Skin is warm and dry.  Psychiatric: He has a normal mood and affect.   Vitals:   02/03/18 0808  BP: 110/70  Pulse: 75  Temp: 98.2 F (36.8 C)  TempSrc: Oral  SpO2: 97%  Weight: 235 lb (106.6 kg)  Height: 6' (1.829 m)      Assessment & Plan:

## 2018-02-03 NOTE — Assessment & Plan Note (Signed)
POC U/A done in the office without signs of infection. Offered STD screening and he declines. Could be related to recent supplement that he started taking. He will stop and see if symptoms resolve.

## 2018-02-03 NOTE — Assessment & Plan Note (Signed)
Worsening symptoms the longer from PT. Referral to PT. He knows that this is a chronic problem and he would like to continue to use flexeril and tylenol as needed. He is not exercising at all and encouraged him to start being active a little.

## 2018-02-03 NOTE — Patient Instructions (Signed)
We will do the physical therapy.   If stopping the supplement does not help let us know.

## 2018-02-11 ENCOUNTER — Encounter: Payer: Self-pay | Admitting: Physical Therapy

## 2018-02-11 ENCOUNTER — Other Ambulatory Visit: Payer: Self-pay

## 2018-02-11 ENCOUNTER — Ambulatory Visit: Payer: Medicare Other | Attending: Internal Medicine | Admitting: Physical Therapy

## 2018-02-11 DIAGNOSIS — M545 Low back pain, unspecified: Secondary | ICD-10-CM

## 2018-02-11 DIAGNOSIS — M25512 Pain in left shoulder: Secondary | ICD-10-CM | POA: Insufficient documentation

## 2018-02-11 DIAGNOSIS — G8929 Other chronic pain: Secondary | ICD-10-CM | POA: Diagnosis not present

## 2018-02-11 DIAGNOSIS — R293 Abnormal posture: Secondary | ICD-10-CM

## 2018-02-11 DIAGNOSIS — R29898 Other symptoms and signs involving the musculoskeletal system: Secondary | ICD-10-CM
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
## 2018-02-16 ENCOUNTER — Ambulatory Visit (INDEPENDENT_AMBULATORY_CARE_PROVIDER_SITE_OTHER): Payer: Medicare Other | Admitting: Podiatry

## 2018-02-16 ENCOUNTER — Encounter: Payer: Self-pay | Admitting: Podiatry

## 2018-02-16 DIAGNOSIS — M722 Plantar fascial fibromatosis: Secondary | ICD-10-CM

## 2018-02-16 MED ORDER — TRIAMCINOLONE ACETONIDE 10 MG/ML IJ SUSP
10.0000 mg | Freq: Once | INTRAMUSCULAR | Status: AC
  Administered 2018-02-16: 10 mg

## 2018-02-18 ENCOUNTER — Ambulatory Visit: Payer: Medicare Other | Admitting: Physical Therapy

## 2018-02-18 ENCOUNTER — Encounter: Payer: Self-pay | Admitting: Physical Therapy

## 2018-02-18 DIAGNOSIS — R29898 Other symptoms and signs involving the musculoskeletal system: Secondary | ICD-10-CM | POA: Diagnosis not present

## 2018-02-18 DIAGNOSIS — M25512 Pain in left shoulder: Secondary | ICD-10-CM

## 2018-02-18 DIAGNOSIS — G8929 Other chronic pain: Secondary | ICD-10-CM | POA: Diagnosis not present

## 2018-02-18 DIAGNOSIS — M545 Low back pain, unspecified: Secondary | ICD-10-CM

## 2018-02-18 DIAGNOSIS — R293 Abnormal posture: Secondary | ICD-10-CM | POA: Diagnosis not present

## 2018-02-18 NOTE — Progress Notes (Signed)
Subjective:   Patient ID: Grant Miller, male   DOB: 43 y.o.   MRN: [id number]   HPI Patient states he was doing pretty well with his heel but it started to become bothersome again and he wants to see what we can do   ROS      Objective:  Physical Exam  Neurovascular status intact with discomfort plantar aspect right heel which is still present with improvement but much better than it was in its initial stage     Assessment:  Plantar fasciitis still present but improved     Plan:  I went ahead today and reinjected the plantar fascia 3 mg Kenalog 5 mg Xylocaine advised on supportive therapy and this can be done periodically and hopefully we can avoid surgery

## 2018-02-18 NOTE — Therapy (Signed)
[place], [place], [address] Phone: [phone]   Fax:  [phone]  Physical Therapy Treatment  Patient Details  Name: Grant Miller MRN: [id number] Date of Birth: 11/24/1975 Referring Provider (PT): Dr Pricilla Holm   Encounter Date: 02/18/2018  PT End of Session - 02/18/18 1415    Visit Number  2    Number of Visits  16    Date for PT Re-Evaluation  04/08/18    Authorization Type  MCR    PT Start Time  6759    PT Stop Time  1508    PT Time Calculation (min)  53 min    Activity Tolerance  Patient tolerated treatment well    Behavior During Therapy  [facility] for tasks assessed/performed       Past Medical History:  Diagnosis Date  . Depression     Past Surgical History:  Procedure Laterality Date  . ABDOMINAL SURGERY      There were no vitals filed for this visit.  Subjective Assessment - 02/18/18 1415    Subjective  Likes stretches and does that at home. Heat is very helpful.     Patient Stated Goals  get back and shoudlers feeling better, loosen up his muscles    Currently in Pain?  Yes    Pain Score  8     Pain Location  Back    Pain Orientation  Left;Lower    Pain Location  Shoulder    Pain Descriptors / Indicators  Tightness                       [facility] Adult PT Treatment/Exercise - 02/18/18 0001      Exercises   Exercises  Lumbar;Shoulder      Lumbar Exercises: Stretches   Passive Hamstring Stretch Limitations  seated EOB      Lumbar Exercises: Aerobic   Nustep  5 min L5 UE & LE      Lumbar Exercises: Supine   AB Set Limitations  hooklying TrA      Shoulder Exercises: Stretch   Other Shoulder Stretches  upper trap stretch      Modalities   Modalities  Moist Heat      Moist Heat Therapy   Number Minutes Moist Heat  10 Minutes    Moist Heat Location  Lumbar Spine      Manual Therapy   Manual Therapy  Soft tissue mobilization;Other (comment);Joint  mobilization    Manual therapy comments  skilled palpatin and monitoring during TPDN    Joint Mobilization  GHJ A to P GR 4    Soft tissue mobilization  Lt lumbar paraspinals, Lt upper trap; pec bending    Other Manual Therapy  abdominal scar mobilization       Trigger Point Dry Needling - 02/18/18 1431    Consent Given?  Yes    Education Handout Provided  --   verbal education   Muscles Treated Upper Body  Upper trapezius    Muscles Treated Lower Body  --   lumbar paraspinals   Upper Trapezius Response  Twitch reponse elicited;Palpable increased muscle length           PT Education - 02/18/18 1503    Education Details  incision & effects on core, TPDN and expected outcomes    Person(s) Educated  Patient    Methods  Explanation    Comprehension  Verbalized understanding;Need further instruction  PT Short Term Goals - 02/11/18 1221      PT SHORT TERM GOAL #1   Title  I with initial HEP ( 03/11/18)     Time  4    Period  Weeks    Status  New    Target Date  03/11/18      PT SHORT TERM GOAL #2   Title  increase lumbar motion to having 50% with minimal pain ( 03/11/18)     Time  4    Period  Weeks    Status  New    Target Date  03/11/18      PT SHORT TERM GOAL #3   Title  increase hamstring flexibility =/> 65 degrees to take pull off low back ( 03/11/18)     Time  4    Period  Weeks    Status  New    Target Date  03/11/18      PT SHORT TERM GOAL #4   Title  pt will tolerated prone position to perform back of the body strengthening ( 03/11/18)     Time  4    Period  Weeks    Status  New    Target Date  03/11/18        PT Long Term Goals - 02/11/18 1223      PT LONG TERM GOAL #1   Title  I with advanced HEP to include gym equipment ( 04/08/18)     Time  8    Period  Weeks    Status  New    Target Date  04/08/18      PT LONG TERM GOAL #2   Title  improve FOTO =/< 46% limited ( 04/08/18)     Time  8    Period  Weeks    Status  New    Target Date   04/08/18      PT LONG TERM GOAL #3   Title  increase cervical rotation =/> 65 degress to ease looking for traffic ( 04/08/18)     Time  8    Period  Weeks    Status  New    Target Date  04/08/18      PT LONG TERM GOAL #4   Title  report overall decrease of pain =/> 75% with daily activity ( 04/08/18)     Time  8    Period  Weeks    Status  New            Plan - 02/18/18 1500    Clinical Impression Statement  Significant tightness noted in pectoralis reduced with manual therapy today. Pulling seen in abdominal scar with OH reaching. Good tolerance to DN.     PT Treatment/Interventions  Dry needling;Joint Manipulations;Manual techniques;Moist Heat;Functional mobility training;Ultrasound;Therapeutic activities;Patient/family education;Taping;Vasopneumatic Device;Therapeutic exercise;Cryotherapy;Electrical Stimulation;Passive range of motion;Scar mobilization    PT Next Visit Plan  abdominal scar mobilization, LE flexibility, periscap strengthening    PT Home Exercise Plan  upper trap stretch, door pec stretch, scar massage, TrA in hooklying, scap retraction    Consulted and Agree with Plan of Care  Patient       Patient will benefit from skilled therapeutic intervention in order to improve the following deficits and impairments:  Pain, Postural dysfunction, Increased muscle spasms, Decreased scar mobility, Decreased range of motion, Decreased strength, Impaired UE functional use, Impaired flexibility  Visit Diagnosis: Chronic bilateral low back pain without sciatica  Chronic left shoulder pain  Abnormal posture  Other symptoms and signs involving the musculoskeletal system     Problem List Patient Active Problem List   Diagnosis Date Noted  . Chronic bilateral low back pain without sciatica 02/03/2018  . Frequent urination 02/03/2018  . Impaired fasting blood sugar 10/20/2017  . Right foot pain 10/20/2017  . Left shoulder pain 04/17/2017  . Ganglion cyst of wrist, right  08/09/2016  . Environmental allergies 05/19/2016  . Fatigue 01/20/2016  . Snoring 01/20/2016  . ED (erectile dysfunction) 01/20/2016  . Routine general medical examination at a health care facility 01/20/2016  . Hx of abdominal surgery 09/28/2014  . Leukopenia 02/04/2014  . Obesity 06/15/2007  . SARCOIDOSIS 06/11/2007    Shelbee Apgar C. Sulamita Lafountain PT, DPT 02/18/18 3:04 PM   [facility] [address] [place], [place], [address] Phone: [phone]   Fax:  [phone]  Name: Grant Miller MRN: [id number] Date of Birth: 03-29-1975

## 2018-02-20 ENCOUNTER — Encounter: Payer: Self-pay | Admitting: Physical Therapy

## 2018-02-20 ENCOUNTER — Ambulatory Visit: Payer: Medicare Other | Admitting: Physical Therapy

## 2018-02-20 DIAGNOSIS — M25512 Pain in left shoulder: Secondary | ICD-10-CM | POA: Diagnosis not present

## 2018-02-20 DIAGNOSIS — G8929 Other chronic pain: Secondary | ICD-10-CM | POA: Diagnosis not present

## 2018-02-20 DIAGNOSIS — M545 Low back pain: Principal | ICD-10-CM

## 2018-02-20 DIAGNOSIS — R293 Abnormal posture: Secondary | ICD-10-CM | POA: Diagnosis not present

## 2018-02-20 DIAGNOSIS — R29898 Other symptoms and signs involving the musculoskeletal system: Secondary | ICD-10-CM | POA: Diagnosis not present
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
## 2018-03-10 ENCOUNTER — Encounter: Payer: Self-pay | Admitting: Physical Therapy

## 2018-03-10 ENCOUNTER — Ambulatory Visit: Payer: Medicare Other | Attending: Internal Medicine | Admitting: Physical Therapy

## 2018-03-10 DIAGNOSIS — M545 Low back pain: Secondary | ICD-10-CM | POA: Diagnosis not present

## 2018-03-10 DIAGNOSIS — M25512 Pain in left shoulder: Secondary | ICD-10-CM | POA: Insufficient documentation

## 2018-03-10 DIAGNOSIS — G8929 Other chronic pain: Secondary | ICD-10-CM

## 2018-03-10 DIAGNOSIS — R29898 Other symptoms and signs involving the musculoskeletal system: Secondary | ICD-10-CM | POA: Insufficient documentation

## 2018-03-10 DIAGNOSIS — R293 Abnormal posture: Secondary | ICD-10-CM

## 2018-03-10 NOTE — Therapy (Signed)
[place], [place], [address] Phone: [phone]   Fax:  [phone]  Physical Therapy Treatment  Patient Details  Name: Grant Miller MRN: [id number] Date of Birth: 11-Mar-1975 Referring Provider (PT): Dr Pricilla Holm   Encounter Date: 03/10/2018  PT End of Session - 03/10/18 1050    Visit Number  4    Number of Visits  16    Date for PT Re-Evaluation  04/08/18    Authorization Type  MCR    PT Start Time  1016    PT Stop Time  1056    PT Time Calculation (min)  40 min    Activity Tolerance  Patient tolerated treatment well    Behavior During Therapy  [facility] for tasks assessed/performed       Past Medical History:  Diagnosis Date  . Depression     Past Surgical History:  Procedure Laterality Date  . ABDOMINAL SURGERY      There were no vitals filed for this visit.  Subjective Assessment - 03/10/18 1019    Subjective  Pt. reports hard to tell if dry needling helped after soreness resolved. He wishes to avoid this today. Pt. continues with left upper trapezius/shoulder and lumbar pain.    Currently in Pain?  Yes    Pain Score  7     Pain Location  Back    Pain Orientation  Left;Lower    Pain Descriptors / Indicators  Aching;Spasm    Pain Type  Chronic pain    Pain Onset  More than a month ago    Pain Frequency  Constant    Aggravating Factors   prone positioning    Pain Relieving Factors  no eases    Multiple Pain Sites  Yes    Pain Score  7    Pain Location  Shoulder    Pain Orientation  Left    Pain Descriptors / Indicators  Tightness    Pain Type  Chronic pain    Pain Radiating Towards  between shoulder blades    Pain Onset  More than a month ago    Pain Frequency  Intermittent    Aggravating Factors   overhead lifting    Pain Relieving Factors  past improvement with therapy         [facility] PT Assessment - 03/10/18 0001      AROM   Cervical - Right Rotation  45    Cervical -  Left Rotation  48                   [facility] Adult PT Treatment/Exercise - 03/10/18 0001      Lumbar Exercises: Stretches   Passive Hamstring Stretch  Right;Left;3 reps;30 seconds    Lower Trunk Rotation  2 reps;20 seconds      Lumbar Exercises: Aerobic   Nustep  L4x5 min UE/LE      Lumbar Exercises: Supine   AB Set Limitations  hooklying TrA isometric x 15 reps    Bridge  10 reps    Bridge Limitations  partial bridge      Shoulder Exercises: Standing   External Rotation  Strengthening;Left;15 reps    Theraband Level (Shoulder External Rotation)  Level 3 (Green)    Internal Rotation  Strengthening;Left;15 reps    Theraband Level (Shoulder Internal Rotation)  Level 3 (Green)    Extension  Strengthening;Both;15 reps    Theraband Level (Shoulder Extension)  Level 3 (Green)  Row  Strengthening;Both;15 reps    Theraband Level (Shoulder Row)  Level 3 (Green)      Shoulder Exercises: Stretch   Other Shoulder Stretches  supine manual left upper trap and levator stretches 3x30 sec ea.      Manual Therapy   Manual Therapy  Soft tissue mobilization    Soft tissue mobilization  Left upper trapezius and levator in sitting, left lumbar paraspinals in right sidelying             PT Education - 03/10/18 1049    Education Details  soreness expected after TPDN, POC    Person(s) Educated  Patient    Methods  Explanation    Comprehension  Verbalized understanding       PT Short Term Goals - 03/10/18 1059      PT SHORT TERM GOAL #1   Title  I with initial HEP ( 03/11/18)     Baseline  met with initial HEP, will update/progress prn    Time  4    Period  Weeks    Status  Achieved      PT SHORT TERM GOAL #2   Title  increase lumbar motion to having 50% with minimal pain ( 03/11/18)     Baseline  not assessed today    Time  4    Period  Weeks    Status  On-going      PT SHORT TERM GOAL #3   Title  increase hamstring flexibility =/> 65 degrees to take pull off low back  ( 03/11/18)     Baseline  limited approx. 60 deg bilat.    Time  4    Period  Weeks    Status  On-going      PT SHORT TERM GOAL #4   Title  pt will tolerated prone position to perform back of the body strengthening ( 03/11/18)     Baseline  still unable    Time  4    Period  Weeks    Status  On-going        PT Long Term Goals - 03/10/18 1047      PT LONG TERM GOAL #1   Title  I with advanced HEP to include gym equipment ( 04/08/18)     Baseline  not yet able    Time  8    Period  Weeks    Status  On-going      PT LONG TERM GOAL #2   Title  improve FOTO =/< 46% limited ( 04/08/18)     Baseline  not assessed today    Time  8    Period  Weeks    Status  On-going      PT LONG TERM GOAL #3   Title  increase cervical rotation =/> 65 degress to ease looking for traffic ( 04/08/18)     Baseline  see flowsheet-not met    Time  8    Period  Weeks    Status  On-going      PT LONG TERM GOAL #4   Title  report overall decrease of pain =/> 75% with daily activity ( 04/08/18)     Baseline  not met    Time  8    Period  Weeks    Status  On-going            Plan - 03/10/18 1057    Clinical Impression Statement  Still uncertain tx. resulst from dry needling/held today  per pt. request. Tx. focus otherwise continued strengthening/stabilization and stretches with manual therapy for shoulder and back. Subjective report decreased shoulder/upper trap region pain post-tx. Mild improvement with cervical ROM from baseline but progress re: goals overall ongoing.    PT Frequency  2x / week    PT Duration  8 weeks    PT Treatment/Interventions  Dry needling;Joint Manipulations;Manual techniques;Moist Heat;Functional mobility training;Ultrasound;Therapeutic activities;Patient/family education;Taping;Vasopneumatic Device;Therapeutic exercise;Cryotherapy;Electrical Stimulation;Passive range of motion;Scar mobilization    PT Next Visit Plan  abdominal scar mobilization, LE flexibility, periscap  strengthening    PT Home Exercise Plan  upper trap stretch, door pec stretch, scar massage, TrA in hooklying, scap retraction    Consulted and Agree with Plan of Care  Patient       Patient will benefit from skilled therapeutic intervention in order to improve the following deficits and impairments:  Pain, Postural dysfunction, Increased muscle spasms, Decreased scar mobility, Decreased range of motion, Decreased strength, Impaired UE functional use, Impaired flexibility  Visit Diagnosis: Chronic bilateral low back pain without sciatica  Chronic left shoulder pain  Abnormal posture  Other symptoms and signs involving the musculoskeletal system     Problem List Patient Active Problem List   Diagnosis Date Noted  . Chronic bilateral low back pain without sciatica 02/03/2018  . Frequent urination 02/03/2018  . Impaired fasting blood sugar 10/20/2017  . Right foot pain 10/20/2017  . Left shoulder pain 04/17/2017  . Ganglion cyst of wrist, right 08/09/2016  . Environmental allergies 05/19/2016  . Fatigue 01/20/2016  . Snoring 01/20/2016  . ED (erectile dysfunction) 01/20/2016  . Routine general medical examination at a health care facility 01/20/2016  . Hx of abdominal surgery 09/28/2014  . Leukopenia 02/04/2014  . Obesity 06/15/2007  . SARCOIDOSIS 06/11/2007    Beaulah Dinning, PT, DPT 03/10/18 11:01 AM  [place] [facility] [address] [place], [place], [address] Phone: [phone]   Fax:  [phone]  Name: Grant Miller MRN: [id number] Date of Birth: 1975-11-11

## 2018-03-12 ENCOUNTER — Encounter: Payer: Self-pay | Admitting: Physical Therapy

## 2018-03-12 ENCOUNTER — Ambulatory Visit: Payer: Medicare Other | Admitting: Physical Therapy

## 2018-03-12 DIAGNOSIS — R29898 Other symptoms and signs involving the musculoskeletal system: Secondary | ICD-10-CM

## 2018-03-12 DIAGNOSIS — R293 Abnormal posture: Secondary | ICD-10-CM

## 2018-03-12 DIAGNOSIS — M25512 Pain in left shoulder: Secondary | ICD-10-CM | POA: Diagnosis not present

## 2018-03-12 DIAGNOSIS — M545 Low back pain: Secondary | ICD-10-CM | POA: Diagnosis not present

## 2018-03-12 DIAGNOSIS — G8929 Other chronic pain: Secondary | ICD-10-CM | POA: Diagnosis not present

## 2018-03-12 NOTE — Therapy (Signed)
[place], [place], [address] Phone: [phone]   Fax:  [phone]  Physical Therapy Treatment  Patient Details  Name: Grant Miller MRN: [id number] Date of Birth: 1976/02/11 Referring Provider (PT): Dr Pricilla Holm   Encounter Date: 03/12/2018  PT End of Session - 03/12/18 1049    Visit Number  5    Number of Visits  16    Date for PT Re-Evaluation  04/08/18    Authorization Type  MCR, progress note by visit 10, KX at 15 visits    PT Start Time  1007    PT Stop Time  1048    PT Time Calculation (min)  41 min    Activity Tolerance  Patient tolerated treatment well    Behavior During Therapy  [facility] for tasks assessed/performed       Past Medical History:  Diagnosis Date  . Depression     Past Surgical History:  Procedure Laterality Date  . ABDOMINAL SURGERY      There were no vitals filed for this visit.  Subjective Assessment - 03/12/18 1008    Subjective  Pt. reports last tx. helped and wants to do similar session. Primary complaint is left shoulder pain.                       [facility] Adult PT Treatment/Exercise - 03/12/18 0001      Lumbar Exercises: Stretches   Passive Hamstring Stretch  Right;Left;3 reps;30 seconds    Piriformis Stretch  Right;Left;3 reps;30 seconds      Lumbar Exercises: Aerobic   Nustep  L4 x 5 min UE/LE      Lumbar Exercises: Supine   Pelvic Tilt  15 reps    Bridge  10 reps    Bridge Limitations  partial bridge    Other Supine Lumbar Exercises  abd. bracing with alt. LE march x 10 ea. bilat.      Shoulder Exercises: Standing   External Rotation  Strengthening;Left;15 reps    Theraband Level (Shoulder External Rotation)  Level 3 (Green)    Internal Rotation  Strengthening;Left;20 reps    Theraband Level (Shoulder Internal Rotation)  Level 3 (Green)    Extension  Strengthening;Both;20 reps    Theraband Level (Shoulder Extension)  Level 3  (Green)    Row  Strengthening;20 reps    Theraband Level (Shoulder Row)  Level 4 (Blue)      Manual Therapy   Soft tissue mobilization  Left upper trapezius in sitting, left posterior scapular region and lumbar paraspinals in right sidelying             PT Education - 03/12/18 1049    Education Details  POC, expected soreness after manual tx.    Person(s) Educated  Patient    Methods  Explanation    Comprehension  Verbalized understanding       PT Short Term Goals - 03/10/18 1059      PT SHORT TERM GOAL #1   Title  I with initial HEP ( 03/11/18)     Baseline  met with initial HEP, will update/progress prn    Time  4    Period  Weeks    Status  Achieved      PT SHORT TERM GOAL #2   Title  increase lumbar motion to having 50% with minimal pain ( 03/11/18)     Baseline  not assessed today    Time  4  Period  Weeks    Status  On-going      PT SHORT TERM GOAL #3   Title  increase hamstring flexibility =/> 65 degrees to take pull off low back ( 03/11/18)     Baseline  limited approx. 60 deg bilat.    Time  4    Period  Weeks    Status  On-going      PT SHORT TERM GOAL #4   Title  pt will tolerated prone position to perform back of the body strengthening ( 03/11/18)     Baseline  still unable    Time  4    Period  Weeks    Status  On-going        PT Long Term Goals - 03/10/18 1047      PT LONG TERM GOAL #1   Title  I with advanced HEP to include gym equipment ( 04/08/18)     Baseline  not yet able    Time  8    Period  Weeks    Status  On-going      PT LONG TERM GOAL #2   Title  improve FOTO =/< 46% limited ( 04/08/18)     Baseline  not assessed today    Time  8    Period  Weeks    Status  On-going      PT LONG TERM GOAL #3   Title  increase cervical rotation =/> 65 degress to ease looking for traffic ( 04/08/18)     Baseline  see flowsheet-not met    Time  8    Period  Weeks    Status  On-going      PT LONG TERM GOAL #4   Title  report overall decrease of  pain =/> 75% with daily activity ( 04/08/18)     Baseline  not met    Time  8    Period  Weeks    Status  On-going            Plan - 03/12/18 1050    Clinical Impression Statement  Continued previous tx. focus with combination of exercises for shoulder and lumbar region with manual therapy for STM respective regions. Ongoing pain complaints but mild improvement from status earlier this week/functional gains for activity tolerance.    PT Frequency  2x / week    PT Duration  8 weeks    PT Treatment/Interventions  Dry needling;Joint Manipulations;Manual techniques;Moist Heat;Functional mobility training;Ultrasound;Therapeutic activities;Patient/family education;Taping;Vasopneumatic Device;Therapeutic exercise;Cryotherapy;Electrical Stimulation;Passive range of motion;Scar mobilization    PT Next Visit Plan  retry abdominal scar mobilization, continue manual tx. left upper trapezius and periscapular + lumbar regions, LE flexibility, periscap strengthening, core stabilization    PT Home Exercise Plan  upper trap stretch, door pec stretch, scar massage, TrA in hooklying, scap retraction    Consulted and Agree with Plan of Care  Patient       Patient will benefit from skilled therapeutic intervention in order to improve the following deficits and impairments:  Pain, Postural dysfunction, Increased muscle spasms, Decreased scar mobility, Decreased range of motion, Decreased strength, Impaired UE functional use, Impaired flexibility  Visit Diagnosis: Chronic bilateral low back pain without sciatica  Chronic left shoulder pain  Abnormal posture  Other symptoms and signs involving the musculoskeletal system     Problem List Patient Active Problem List   Diagnosis Date Noted  . Chronic bilateral low back pain without sciatica 02/03/2018  . Frequent urination 02/03/2018  .  Impaired fasting blood sugar 10/20/2017  . Right foot pain 10/20/2017  . Left shoulder pain 04/17/2017  . Ganglion  cyst of wrist, right 08/09/2016  . Environmental allergies 05/19/2016  . Fatigue 01/20/2016  . Snoring 01/20/2016  . ED (erectile dysfunction) 01/20/2016  . Routine general medical examination at a health care facility 01/20/2016  . Hx of abdominal surgery 09/28/2014  . Leukopenia 02/04/2014  . Obesity 06/15/2007  . SARCOIDOSIS 06/11/2007   Beaulah Dinning, PT, DPT 03/12/18 10:53 AM  [facility] [address] [place], [place], [address] Phone: [phone]   Fax:  [phone]  Name: Nasean Zapf MRN: [id number] Date of Birth: 1976/01/07

## 2018-03-17 ENCOUNTER — Ambulatory Visit: Payer: Medicare Other | Admitting: Physical Therapy

## 2018-03-17 ENCOUNTER — Encounter: Payer: Self-pay | Admitting: Physical Therapy

## 2018-03-17 DIAGNOSIS — G8929 Other chronic pain: Secondary | ICD-10-CM | POA: Diagnosis not present

## 2018-03-17 DIAGNOSIS — M25512 Pain in left shoulder: Secondary | ICD-10-CM

## 2018-03-17 DIAGNOSIS — R293 Abnormal posture: Secondary | ICD-10-CM

## 2018-03-17 DIAGNOSIS — R29898 Other symptoms and signs involving the musculoskeletal system: Secondary | ICD-10-CM

## 2018-03-17 DIAGNOSIS — M545 Low back pain: Principal | ICD-10-CM

## 2018-03-17 NOTE — Therapy (Signed)
[place], [place], [address] Phone: [phone]   Fax:  [phone]  Physical Therapy Treatment  Patient Details  Name: Grant Miller MRN: [id number] Date of Birth: 01-21-1976 Referring Provider (PT): Dr Pricilla Holm   Encounter Date: 03/17/2018  PT End of Session - 03/17/18 1134    Visit Number  6    Number of Visits  16    Date for PT Re-Evaluation  04/08/18    Authorization Type  MCR, progress note by visit 10, KX at 15 visits    PT Start Time  1048    PT Stop Time  1131    PT Time Calculation (min)  43 min    Activity Tolerance  Patient tolerated treatment well    Behavior During Therapy  [facility] for tasks assessed/performed       Past Medical History:  Diagnosis Date  . Depression     Past Surgical History:  Procedure Laterality Date  . ABDOMINAL SURGERY      There were no vitals filed for this visit.  Subjective Assessment - 03/17/18 1049    Subjective  Pt. reports low back "not 100%" but improved with stretches. Primary complaint is left shoulder pain which today is more in anterior and lateral shoulder more than upper trapezius region.    Currently in Pain?  Yes    Pain Score  6     Pain Location  Back    Pain Orientation  Left;Lower    Pain Descriptors / Indicators  Aching    Pain Type  Chronic pain    Pain Onset  More than a month ago    Pain Frequency  Constant    Aggravating Factors   prone positioning    Pain Relieving Factors  stretches    Pain Score  8    Pain Location  Shoulder    Pain Orientation  Left    Pain Descriptors / Indicators  Sharp    Pain Type  Chronic pain    Pain Radiating Towards  lateral and anterior shoulder    Pain Frequency  Intermittent    Aggravating Factors   reaching and lifting activities    Pain Relieving Factors  rest, past improvement with therapy         [facility] PT Assessment - 03/17/18 0001      AROM   Lumbar Flexion  70 deg-to mid  shin    Lumbar Extension  30%    Lumbar - Right Rotation  40%    Lumbar - Left Rotation  50%                   [facility] Adult PT Treatment/Exercise - 03/17/18 0001      Lumbar Exercises: Aerobic   Nustep  L4 x 5 min UE/LE      Shoulder Exercises: Supine   Horizontal ABduction  Strengthening;Both;15 reps   verbal + tactile cues for humeral position, scapular retract   Other Supine Exercises  serratus punch x 10 reps, cues to keep elbow extended      Shoulder Exercises: Sidelying   External Rotation  Strengthening;Left;20 reps    External Rotation Weight (lbs)  2      Shoulder Exercises: Standing   External Rotation  Strengthening;Left;20 reps    Theraband Level (Shoulder External Rotation)  Level 3 (Green)    Internal Rotation  Strengthening;Left;20 reps    Theraband Level (Shoulder Internal Rotation)  Level 3 (Green)  Extension  Strengthening;20 reps    Theraband Level (Shoulder Extension)  Level 3 (Green)    Row  Strengthening;20 reps    Theraband Level (Shoulder Row)  Level 4 (Blue)   cues for proximal ar, position/angle of ROM     Manual Therapy   Soft tissue mobilization  Left posterior scapular region incl. infraspinatus, supraspinatus, also STM deltoids posterior, middle, anterior incl. wtih ant. deltoid contract/relax             PT Education - 03/17/18 1133    Education Details  Shoulder anatomy/mechanics with use model, etiology impingement, avoid excessive abduction, reaching strategies plane of scaption    Person(s) Educated  Patient    Methods  Explanation;Demonstration;Verbal cues    Comprehension  Verbalized understanding       PT Short Term Goals - 03/17/18 1138      PT SHORT TERM GOAL #1   Title  I with initial HEP     Baseline  met with initial HEP, will update/progress prn    Time  4    Period  Weeks    Status  Achieved      PT SHORT TERM GOAL #2   Title  increase lumbar motion to having 50% with minimal pain     Baseline  see  flowsheet, still limited in extension and right rotation    Time  4    Period  Weeks    Status  On-going      PT SHORT TERM GOAL #3   Title  increase hamstring flexibility =/> 65 degrees to take pull off low back     Baseline  limited approx. 60 deg bilat. 1 week ago, not retested today    Time  4    Period  Weeks    Status  On-going      PT SHORT TERM GOAL #4   Title  pt will tolerated prone position to perform back of the body strengthening    Baseline  still unable    Time  4    Period  Weeks    Status  On-going        PT Long Term Goals - 03/17/18 1140      PT LONG TERM GOAL #1   Title  I with advanced HEP to include gym equipment ( 04/08/18)     Baseline  not yet able    Time  8    Period  Weeks    Status  On-going      PT LONG TERM GOAL #2   Title  improve FOTO =/< 46% limited ( 04/08/18)     Baseline  not assessed today    Time  8    Period  Weeks    Status  On-going      PT LONG TERM GOAL #3   Title  increase cervical rotation =/> 65 degress to ease looking for traffic ( 04/08/18)     Baseline  not met    Time  8    Period  Weeks    Status  On-going      PT LONG TERM GOAL #4   Title  report overall decrease of pain =/> 75% with daily activity ( 04/08/18)     Baseline  ongoing, improved for back but shoulder limitations ongoing    Time  8    Period  Weeks    Status  On-going            Plan - 03/17/18  70    Clinical Impression Statement  Tx. focus for shoulder today as primary area of complaint with scapular stabilization + rotator cuff strengthening and extensive manual work to address soft tissue restriction in posterior scapular and deltoid regions. Pt. education for shoulder anatomy and impingement etiology with advice to minimize excessive abduction ROM. Pt. states may be interested in trying dry needling again next visit and will consider for next treatment. Mild improvements with lumbar tightness from baseline status with gains for flexion ROM.     Rehab Potential  Good    PT Frequency  2x / week    PT Duration  8 weeks    PT Treatment/Interventions  Dry needling;Joint Manipulations;Manual techniques;Moist Heat;Functional mobility training;Ultrasound;Therapeutic activities;Patient/family education;Taping;Vasopneumatic Device;Therapeutic exercise;Cryotherapy;Electrical Stimulation;Passive range of motion;Scar mobilization    PT Next Visit Plan  Tentative focus shoulder pending area of greatest tx. need, potential try needling to address soft tissue restriction pending pt. preferences.    PT Home Exercise Plan  upper trap stretch, door pec stretch, scar massage, TrA in hooklying, scap retraction, Theraband ER, IR, horiz. abd    Consulted and Agree with Plan of Care  Patient       Patient will benefit from skilled therapeutic intervention in order to improve the following deficits and impairments:  Pain, Postural dysfunction, Increased muscle spasms, Decreased scar mobility, Decreased range of motion, Decreased strength, Impaired UE functional use, Impaired flexibility  Visit Diagnosis: Chronic bilateral low back pain without sciatica  Chronic left shoulder pain  Abnormal posture  Other symptoms and signs involving the musculoskeletal system     Problem List Patient Active Problem List   Diagnosis Date Noted  . Chronic bilateral low back pain without sciatica 02/03/2018  . Frequent urination 02/03/2018  . Impaired fasting blood sugar 10/20/2017  . Right foot pain 10/20/2017  . Left shoulder pain 04/17/2017  . Ganglion cyst of wrist, right 08/09/2016  . Environmental allergies 05/19/2016  . Fatigue 01/20/2016  . Snoring 01/20/2016  . ED (erectile dysfunction) 01/20/2016  . Routine general medical examination at a health care facility 01/20/2016  . Hx of abdominal surgery 09/28/2014  . Leukopenia 02/04/2014  . Obesity 06/15/2007  . SARCOIDOSIS 06/11/2007    Beaulah Dinning, PT, DPT 03/17/18 11:42 AM  [facility] [address] [place], [place], [address] Phone: [phone]   Fax:  [phone]  Name: Jushua Waltman MRN: [id number] Date of Birth: [birth year]

## 2018-03-19 ENCOUNTER — Telehealth: Payer: Self-pay | Admitting: Oncology

## 2018-03-19 ENCOUNTER — Telehealth: Payer: Self-pay

## 2018-03-19 ENCOUNTER — Inpatient Hospital Stay (HOSPITAL_BASED_OUTPATIENT_CLINIC_OR_DEPARTMENT_OTHER): Payer: Medicare Other | Admitting: Oncology

## 2018-03-19 ENCOUNTER — Ambulatory Visit: Payer: Medicare Other | Admitting: Physical Therapy

## 2018-03-19 ENCOUNTER — Inpatient Hospital Stay: Payer: Medicare Other | Attending: Oncology

## 2018-03-19 ENCOUNTER — Encounter: Payer: Self-pay | Admitting: Physical Therapy

## 2018-03-19 VITALS — BP 124/79 | HR 54 | Temp 97.9°F | Resp 18 | Ht 72.0 in | Wt 231.4 lb

## 2018-03-19 DIAGNOSIS — D709 Neutropenia, unspecified: Secondary | ICD-10-CM | POA: Diagnosis not present

## 2018-03-19 DIAGNOSIS — Z791 Long term (current) use of non-steroidal anti-inflammatories (NSAID): Secondary | ICD-10-CM | POA: Diagnosis not present

## 2018-03-19 DIAGNOSIS — R293 Abnormal posture: Secondary | ICD-10-CM | POA: Diagnosis not present

## 2018-03-19 DIAGNOSIS — M25512 Pain in left shoulder: Secondary | ICD-10-CM

## 2018-03-19 DIAGNOSIS — N401 Enlarged prostate with lower urinary tract symptoms: Secondary | ICD-10-CM

## 2018-03-19 DIAGNOSIS — R29898 Other symptoms and signs involving the musculoskeletal system: Secondary | ICD-10-CM | POA: Diagnosis not present

## 2018-03-19 DIAGNOSIS — G8929 Other chronic pain: Secondary | ICD-10-CM

## 2018-03-19 DIAGNOSIS — R350 Frequency of micturition: Secondary | ICD-10-CM

## 2018-03-19 DIAGNOSIS — M545 Low back pain: Secondary | ICD-10-CM | POA: Diagnosis not present

## 2018-03-19 LAB — CBC WITH DIFFERENTIAL (CANCER CENTER ONLY)
Abs Immature Granulocytes: 0.01 10*3/uL (ref 0.00–0.07)
BASOS PCT: 0 %
Basophils Absolute: 0 10*3/uL (ref 0.0–0.1)
EOS ABS: 0 10*3/uL (ref 0.0–0.5)
EOS PCT: 1 %
HCT: 39.9 % (ref 39.0–52.0)
Hemoglobin: 13 g/dL (ref 13.0–17.0)
IMMATURE GRANULOCYTES: 0 %
Lymphocytes Relative: 53 %
Lymphs Abs: 2 10*3/uL (ref 0.7–4.0)
MCH: 27.2 pg (ref 26.0–34.0)
MCHC: 32.6 g/dL (ref 30.0–36.0)
MCV: 83.5 fL (ref 80.0–100.0)
MONOS PCT: 7 %
Monocytes Absolute: 0.3 10*3/uL (ref 0.1–1.0)
NEUTROS PCT: 39 %
Neutro Abs: 1.5 10*3/uL — ABNORMAL LOW (ref 1.7–7.7)
PLATELETS: 230 10*3/uL (ref 150–400)
RBC: 4.78 MIL/uL (ref 4.22–5.81)
RDW: 13.2 % (ref 11.5–15.5)
WBC Count: 3.8 10*3/uL — ABNORMAL LOW (ref 4.0–10.5)
nRBC: 0 % (ref 0.0–0.2)

## 2018-03-19 NOTE — Telephone Encounter (Signed)
Printed calendar and avs. °

## 2018-03-19 NOTE — Therapy (Signed)
[place], [place], [address] Phone: [phone]   Fax:  [phone]  Physical Therapy Treatment  Patient Details  Name: Grant Miller MRN: [id number] Date of Birth: 1975/07/12 Referring Provider (PT): Dr Pricilla Holm   Encounter Date: 03/19/2018  PT End of Session - 03/19/18 1103    Visit Number  7    Number of Visits  16    Date for PT Re-Evaluation  04/08/18    Authorization Type  MCR, progress note by visit 10, KX at 15 visits    PT Start Time  6599    PT Stop Time  1053    PT Time Calculation (min)  39 min    Activity Tolerance  Patient tolerated treatment well    Behavior During Therapy  [facility] for tasks assessed/performed       Past Medical History:  Diagnosis Date  . Depression     Past Surgical History:  Procedure Laterality Date  . ABDOMINAL SURGERY      There were no vitals filed for this visit.  Subjective Assessment - 03/19/18 1102    Subjective  Moderate improvement of left shoulder pain after last session but continues with anterolateral pain in particular with reaching. Back continues to improve/wishes to continue shoulder focus. Pt. wishes to defer dry needling until next week.                       [facility] Adult PT Treatment/Exercise - 03/19/18 0001      Shoulder Exercises: Supine   Horizontal ABduction  Strengthening;Both;15 reps    Horizontal ABduction Limitations  Red Theraband    Other Supine Exercises  serratus punch x 15 reps      Shoulder Exercises: Sidelying   External Rotation  Strengthening;Left;20 reps    External Rotation Weight (lbs)  2    Other Sidelying Exercises  left scaption AROM to 90 deg x 15 reps      Shoulder Exercises: Standing   External Rotation  Strengthening;Left;20 reps    Theraband Level (Shoulder External Rotation)  Level 3 (Green)    Internal Rotation  Strengthening;Left;20 reps    Theraband Level (Shoulder Internal  Rotation)  Level 3 (Green)    Extension  Strengthening;Both;20 reps    Theraband Level (Shoulder Extension)  Level 3 (Green)    Row  Strengthening;Both;20 reps    Theraband Level (Shoulder Row)  Level 3 (Green)    Other Standing Exercises  [place] flexion red band x 15 reps bilat. UE      Manual Therapy   Joint Mobilization  Left GH mobilization AP grade I-III    Soft tissue mobilization  STM left upper trapezius, posterior shoulder and anterior deltoid + bicep region             PT Education - 03/19/18 1102    Education Details  POC, shoulder anatomy    Person(s) Educated  Patient    Methods  Explanation;Demonstration    Comprehension  Verbalized understanding       PT Short Term Goals - 03/17/18 1138      PT SHORT TERM GOAL #1   Title  I with initial HEP     Baseline  met with initial HEP, will update/progress prn    Time  4    Period  Weeks    Status  Achieved      PT SHORT TERM GOAL #2   Title  increase lumbar motion to  having 50% with minimal pain     Baseline  see flowsheet, still limited in extension and right rotation    Time  4    Period  Weeks    Status  On-going      PT SHORT TERM GOAL #3   Title  increase hamstring flexibility =/> 65 degrees to take pull off low back     Baseline  limited approx. 60 deg bilat. 1 week ago, not retested today    Time  4    Period  Weeks    Status  On-going      PT SHORT TERM GOAL #4   Title  pt will tolerated prone position to perform back of the body strengthening    Baseline  still unable    Time  4    Period  Weeks    Status  On-going        PT Long Term Goals - 03/17/18 1140      PT LONG TERM GOAL #1   Title  I with advanced HEP to include gym equipment ( 04/08/18)     Baseline  not yet able    Time  8    Period  Weeks    Status  On-going      PT LONG TERM GOAL #2   Title  improve FOTO =/< 46% limited ( 04/08/18)     Baseline  not assessed today    Time  8    Period  Weeks    Status  On-going       PT LONG TERM GOAL #3   Title  increase cervical rotation =/> 65 degress to ease looking for traffic ( 04/08/18)     Baseline  not met    Time  8    Period  Weeks    Status  On-going      PT LONG TERM GOAL #4   Title  report overall decrease of pain =/> 75% with daily activity ( 04/08/18)     Baseline  ongoing, improved for back but shoulder limitations ongoing    Time  8    Period  Weeks    Status  On-going            Plan - 03/19/18 1103    Clinical Impression Statement  Continued shoulder focus with treatment-would suspect combination impingement and underlying issues s/p AC separation with deltoid region muscle pain, potential contributing proximal bicep pain. Gradual improvement with combination exercises and manual therapy to address.    Rehab Potential  Good    PT Frequency  2x / week    PT Duration  8 weeks    PT Treatment/Interventions  Dry needling;Joint Manipulations;Manual techniques;Moist Heat;Functional mobility training;Ultrasound;Therapeutic activities;Patient/family education;Taping;Vasopneumatic Device;Therapeutic exercise;Cryotherapy;Electrical Stimulation;Passive range of motion;Scar mobilization    PT Next Visit Plan  Assess FOTO next tx., Tentative focus shoulder pending area of greatest tx. need, potential try needling to address soft tissue restriction pending pt. preferences.    PT Home Exercise Plan  upper trap stretch, door pec stretch, scar massage, TrA in hooklying, scap retraction, Theraband ER, IR, horiz. abd    Consulted and Agree with Plan of Care  Patient       Patient will benefit from skilled therapeutic intervention in order to improve the following deficits and impairments:  Pain, Postural dysfunction, Increased muscle spasms, Decreased scar mobility, Decreased range of motion, Decreased strength, Impaired UE functional use, Impaired flexibility  Visit Diagnosis: Chronic bilateral low back pain without  sciatica  Chronic left shoulder  pain  Abnormal posture  Other symptoms and signs involving the musculoskeletal system     Problem List Patient Active Problem List   Diagnosis Date Noted  . Chronic bilateral low back pain without sciatica 02/03/2018  . Frequent urination 02/03/2018  . Impaired fasting blood sugar 10/20/2017  . Right foot pain 10/20/2017  . Left shoulder pain 04/17/2017  . Ganglion cyst of wrist, right 08/09/2016  . Environmental allergies 05/19/2016  . Fatigue 01/20/2016  . Snoring 01/20/2016  . ED (erectile dysfunction) 01/20/2016  . Routine general medical examination at a health care facility 01/20/2016  . Hx of abdominal surgery 09/28/2014  . Leukopenia 02/04/2014  . Obesity 06/15/2007  . SARCOIDOSIS 06/11/2007    Beaulah Dinning, PT, DPT 03/19/18 11:06 AM  [facility] [address] [place], [place], [address] Phone: [phone]   Fax:  [phone]  Name: Grant Miller MRN: [id number] Date of Birth: 12-27-1975

## 2018-03-19 NOTE — Telephone Encounter (Signed)
Per 1/16 los sent referral through [employer] for (Grant Miller)

## 2018-03-19 NOTE — Telephone Encounter (Signed)
Faxed referral over to [facility] urology specialists.

## 2018-03-19 NOTE — Progress Notes (Signed)
Hematology and Oncology Follow Up Visit  Grant Miller [id number] 07-16-1975 43 y.o. 03/19/2018 8:15 AM   Principle Diagnosis: 43 year old man with neutropenia diagnosed in 2013.  Etiology is related to benign fluctuating causes.     Current therapy: Active surveillance.  Interim History: Mr. Doubek presents today for a repeat evaluation.  Since the last visit, he reports no major changes in his health.  Continues to have issues with urinary frequency and nocturia although does not report any hematuria or discomfort.  He does not report any recent hospitalization or illnesses.  He denies any recurrent infections.  His performance status and quality of life remain excellent.   He does not report any headaches, blurry vision, syncope or seizures.  He denies any lethargy or dizziness.  He had not had any fevers, had not had any chills. He does not report any cough, wheezing or hemoptysis. He does not report any chest pain, palpitation or leg edema. He does not report any nausea, vomiting or abdominal distention.  He denies any changes in bowel habits.  He does not report any frequency, urgency or hesitancy. Does not report any dysuria or dysuria.  He does not report any bone pain or pathological fractures.  He denies any ecchymosis or petechiae.  Rest or view of system is negative  Medications: I have reviewed the patient's current medications.   Current Outpatient Medications  Medication Sig Dispense Refill  . acetaminophen (TYLENOL) 500 MG tablet Take by mouth.    . cyclobenzaprine (FLEXERIL) 10 MG tablet Take 1 tablet (10 mg total) by mouth 2 (two) times daily as needed for muscle spasms. 20 tablet 0  . Diphenhydramine-APAP, sleep, (TYLENOL PM EXTRA STRENGTH PO) Take by mouth.    . fluticasone (FLONASE) 50 MCG/ACT nasal spray PLACE 2 SPRAYS INTO BOTH NOSTRILS DAILY 16 g 3  . naproxen (NAPROSYN) 375 MG tablet Take 1 tablet (375 mg total) by mouth 2 (two) times daily. 20 tablet 0  . sildenafil  (VIAGRA) 100 MG tablet Take 0.5-1 tablets (50-100 mg total) by mouth daily as needed for erectile dysfunction. 5 tablet 11  . traMADol (ULTRAM) 50 MG tablet Take 1 tablet (50 mg total) by mouth every 8 (eight) hours as needed. 30 tablet 0   No current facility-administered medications for this visit.     Allergies:  Allergies  Allergen Reactions  . Iodine Hives and Shortness Of Breath  . Other Hives and Shortness Of Breath    All seafood causes this reaction  . Shellfish Allergy     Other reaction(s): SHORTNESS OF BREATH    Past Medical History, Surgical history, Social history, and Family History were reviewed and updated.   Physical Exam: Blood pressure 124/79, pulse (!) 54, temperature 97.9 F (36.6 C), temperature source Oral, resp. rate 18, height 6' (1.829 m), weight 231 lb 6.4 oz (105 kg), SpO2 100 %.   ECOG: 0   General appearance: Comfortable appearing without any discomfort Head: Normocephalic without any trauma Oropharynx: Mucous membranes are moist and pink without any thrush or ulcers. Eyes: Pupils are equal and round reactive to light. Lymph nodes: No cervical, supraclavicular, inguinal or axillary lymphadenopathy.   Heart:regular rate and rhythm.  S1 and S2 without leg edema. Lung: Clear without any rhonchi or wheezes.  No dullness to percussion. Abdomin: Soft, nontender, nondistended with good bowel sounds.  No hepatosplenomegaly. Musculoskeletal: No joint deformity or effusion.  Full range of motion noted. Neurological: No deficits noted on motor, sensory and deep tendon  reflex exam. Skin: No petechial rash or dryness.  Appeared moist.     Lab Results: Lab Results  Component Value Date   WBC 3.8 (L) 03/19/2018   HGB 13.0 03/19/2018   HCT 39.9 03/19/2018   MCV 83.5 03/19/2018   PLT 230 03/19/2018     Chemistry      Component Value Date/Time   NA 141 10/20/2017 0925   NA 142 08/16/2014 0759   K 4.1 10/20/2017 0925   K 3.8 08/16/2014 0759   CL  107 10/20/2017 0925   CO2 26 10/20/2017 0925   CO2 22 08/16/2014 0759   BUN 19 10/20/2017 0925   BUN 19.6 08/16/2014 0759   CREATININE 1.23 10/20/2017 0925   CREATININE 1.2 08/16/2014 0759      Component Value Date/Time   CALCIUM 10.3 10/20/2017 0925   CALCIUM 9.2 08/16/2014 0759   ALKPHOS 38 (L) 10/20/2017 0925   ALKPHOS 62 08/16/2014 0759   AST 32 10/20/2017 0925   AST 35 (H) 08/16/2014 0759   ALT 34 10/20/2017 0925   ALT 36 08/16/2014 0759   BILITOT 0.5 10/20/2017 0925   BILITOT 0.43 08/16/2014 0759       Impression and Plan:   43 year old man with:   1.  Neutropenia: Diagnosed in 2013 with likely etiology related due to benign fluctuating neutropenia versus autoimmune causes.  He remains on active surveillance at this time without any need for intervention.  His CBC was reviewed today and showed a white cell count of 3.8 with absolute neutrophil count of 1500.  Differential diagnosis was discussed today with the patient as well as management options.  I recommended continue active surveillance at this time given the benign nature of his neutropenia.  2. Health maintenance issues.  Up-to-date at this time without any recent signs or symptoms of malignancy.  He does have urinary frequency and nocturia that is been disruptive to his quality of life.  I will refer him for urology for evaluation and possible prostate cancer screening.  3. Follow-up: 6 months sooner if needed to.   15 minutes was spent with the patient face-to-face today.  More than 50% of time was dedicated to reviewing his laboratory data, differential diagnosis and answering questions regarding future plan of care.    Zola Button, MD 1/16/20208:15 AM

## 2018-03-24 ENCOUNTER — Ambulatory Visit: Payer: Medicare Other | Admitting: Physical Therapy

## 2018-03-24 ENCOUNTER — Encounter: Payer: Self-pay | Admitting: Physical Therapy

## 2018-03-24 DIAGNOSIS — G8929 Other chronic pain: Secondary | ICD-10-CM

## 2018-03-24 DIAGNOSIS — M545 Low back pain: Principal | ICD-10-CM

## 2018-03-24 DIAGNOSIS — R293 Abnormal posture: Secondary | ICD-10-CM

## 2018-03-24 DIAGNOSIS — R29898 Other symptoms and signs involving the musculoskeletal system: Secondary | ICD-10-CM | POA: Diagnosis not present

## 2018-03-24 DIAGNOSIS — M25512 Pain in left shoulder: Secondary | ICD-10-CM | POA: Diagnosis not present
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
## 2018-03-26 ENCOUNTER — Encounter: Payer: Self-pay | Admitting: Physical Therapy

## 2018-03-26 ENCOUNTER — Ambulatory Visit: Payer: Medicare Other | Admitting: Physical Therapy

## 2018-03-26 DIAGNOSIS — M545 Low back pain, unspecified: Secondary | ICD-10-CM

## 2018-03-26 DIAGNOSIS — R29898 Other symptoms and signs involving the musculoskeletal system: Secondary | ICD-10-CM | POA: Diagnosis not present

## 2018-03-26 DIAGNOSIS — G8929 Other chronic pain: Secondary | ICD-10-CM

## 2018-03-26 DIAGNOSIS — R293 Abnormal posture: Secondary | ICD-10-CM | POA: Diagnosis not present

## 2018-03-26 DIAGNOSIS — M25512 Pain in left shoulder: Secondary | ICD-10-CM | POA: Diagnosis not present
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
## 2018-03-31 ENCOUNTER — Encounter: Payer: Self-pay | Admitting: Physical Therapy

## 2018-03-31 ENCOUNTER — Ambulatory Visit: Payer: Medicare Other | Admitting: Physical Therapy

## 2018-03-31 DIAGNOSIS — R29898 Other symptoms and signs involving the musculoskeletal system: Secondary | ICD-10-CM

## 2018-03-31 DIAGNOSIS — G8929 Other chronic pain: Secondary | ICD-10-CM

## 2018-03-31 DIAGNOSIS — M25512 Pain in left shoulder: Secondary | ICD-10-CM

## 2018-03-31 DIAGNOSIS — M545 Low back pain: Secondary | ICD-10-CM | POA: Diagnosis not present

## 2018-03-31 DIAGNOSIS — R293 Abnormal posture: Secondary | ICD-10-CM | POA: Diagnosis not present
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
## 2018-04-02 ENCOUNTER — Ambulatory Visit: Payer: Medicare Other | Admitting: Physical Therapy

## 2018-04-07 ENCOUNTER — Ambulatory Visit: Payer: Medicare Other | Admitting: Physical Therapy

## 2018-04-13 ENCOUNTER — Telehealth: Payer: Self-pay | Admitting: Physical Therapy

## 2018-04-13 NOTE — Telephone Encounter (Signed)
Called patient to check on status-not seen for therapy the past 2 weeks/patient had cancelled last appointment. Left message to call if wishing to schedule further therapy.

## 2018-04-14 ENCOUNTER — Encounter: Payer: Medicare Other | Admitting: Physical Therapy

## 2018-04-16 ENCOUNTER — Encounter: Payer: Medicare Other | Admitting: Physical Therapy

## 2018-04-21 ENCOUNTER — Encounter: Payer: Medicare Other | Admitting: Physical Therapy

## 2018-04-23 ENCOUNTER — Encounter: Payer: Medicare Other | Admitting: Physical Therapy

## 2018-04-28 ENCOUNTER — Encounter: Payer: Medicare Other | Admitting: Physical Therapy

## 2018-04-30 ENCOUNTER — Encounter: Payer: Medicare Other | Admitting: Physical Therapy

## 2018-05-06 NOTE — Therapy (Signed)
[place], [place], [address] Phone: [phone]   Fax:  [phone]  Physical Therapy Treatment/Discharge Summary  Patient Details  Name: Grant Miller MRN: [id number] Date of Birth: 03-27-1975 Referring Provider (PT): Dr Pricilla Holm   Encounter Date: 03/31/2018    Past Medical History:  Diagnosis Date  . Depression     Past Surgical History:  Procedure Laterality Date  . ABDOMINAL SURGERY      There were no vitals filed for this visit.                              PT Short Term Goals - 03/26/18 1308      PT SHORT TERM GOAL #1   Title  I with initial HEP     Baseline  met with initial HEP, will update/progress prn    Time  4    Period  Weeks    Status  Achieved    Target Date  --      PT SHORT TERM GOAL #2   Title  increase lumbar motion to having 50% with minimal pain     Baseline  not met, continue goal    Time  3    Period  Weeks    Status  On-going    Target Date  04/16/18      PT SHORT TERM GOAL #3   Title  increase hamstring flexibility =/> 65 degrees to take pull off low back     Baseline  60 deg    Time  3    Period  Weeks    Status  On-going    Target Date  04/16/18      PT SHORT TERM GOAL #4   Title  pt will tolerated prone position to perform back of the body strengthening    Baseline  still unable-d/c this goal        PT Long Term Goals - 03/26/18 1309      PT LONG TERM GOAL #1   Title  I with advanced HEP to include gym equipment     Baseline  not yet able    Time  8    Period  Weeks    Status  On-going    Target Date  05/07/18      PT LONG TERM GOAL #2   Title  improve FOTO =/< 46% limited     Baseline  51% limited    Time  6    Period  Weeks    Status  On-going    Target Date  05/07/18      PT LONG TERM GOAL #3   Title  increase cervical rotation =/> 65 degress to ease looking for traffic     Baseline  60 deg  bilat.    Time  6    Period  Weeks    Status  On-going    Target Date  05/07/18      PT LONG TERM GOAL #4   Title  report overall decrease of pain =/> 75% with daily activity     Baseline  40-50% for shoulder, 50% for back    Time  6    Period  Weeks    Status  On-going    Target Date  05/07/18      PT LONG TERM GOAL #5   Title  Increase left shoulder flexion and abduction AROM at least 10  deg ea. to improve ability to wash hair    Baseline  abd 100 deg, flex 120 deg    Time  6    Period  Weeks    Status  New    Target Date  05/07/18              Patient will benefit from skilled therapeutic intervention in order to improve the following deficits and impairments:  Pain, Postural dysfunction, Increased muscle spasms, Decreased scar mobility, Decreased range of motion, Decreased strength, Impaired UE functional use, Impaired flexibility  Visit Diagnosis: Chronic bilateral low back pain without sciatica  Chronic left shoulder pain  Abnormal posture  Other symptoms and signs involving the musculoskeletal system     Problem List Patient Active Problem List   Diagnosis Date Noted  . Chronic bilateral low back pain without sciatica 02/03/2018  . Frequent urination 02/03/2018  . Impaired fasting blood sugar 10/20/2017  . Right foot pain 10/20/2017  . Left shoulder pain 04/17/2017  . Ganglion cyst of wrist, right 08/09/2016  . Environmental allergies 05/19/2016  . Fatigue 01/20/2016  . Snoring 01/20/2016  . ED (erectile dysfunction) 01/20/2016  . Routine general medical examination at a health care facility 01/20/2016  . Hx of abdominal surgery 09/28/2014  . Leukopenia 02/04/2014  . Obesity 06/15/2007  . SARCOIDOSIS 06/11/2007     PHYSICAL THERAPY DISCHARGE SUMMARY  Visits from Start of Care: 10  Current functional level related to goals / functional outcomes: Status unknown. Patient did not return for further therapy after last visit 03/31/18    Remaining deficits: Status unknown   Education / Equipment: NA  Plan:                                                    Patient goals were not met. Patient is being discharged due to not returning since the last visit.  ?????         Beaulah Dinning, PT, DPT 05/06/18 10:37 AM   [facility] [address] [place], [place], [address] Phone: [phone]   Fax:  [phone]  Name: Grant Miller MRN: [id number] Date of Birth: 01/27/1976

## 2018-06-02 DIAGNOSIS — M25512 Pain in left shoulder: Secondary | ICD-10-CM | POA: Diagnosis not present

## 2018-06-02 DIAGNOSIS — M722 Plantar fascial fibromatosis: Secondary | ICD-10-CM | POA: Diagnosis not present

## 2018-06-02 DIAGNOSIS — M79671 Pain in right foot: Secondary | ICD-10-CM | POA: Diagnosis not present

## 2018-06-03 DIAGNOSIS — M7542 Impingement syndrome of left shoulder: Secondary | ICD-10-CM | POA: Diagnosis not present

## 2018-06-03 DIAGNOSIS — M19012 Primary osteoarthritis, left shoulder: Secondary | ICD-10-CM | POA: Diagnosis not present

## 2018-06-03 DIAGNOSIS — M722 Plantar fascial fibromatosis: Secondary | ICD-10-CM | POA: Diagnosis not present

## 2018-06-15 DIAGNOSIS — M722 Plantar fascial fibromatosis: Secondary | ICD-10-CM | POA: Diagnosis not present

## 2018-06-15 DIAGNOSIS — M7542 Impingement syndrome of left shoulder: Secondary | ICD-10-CM | POA: Diagnosis not present

## 2018-06-15 DIAGNOSIS — M19012 Primary osteoarthritis, left shoulder: Secondary | ICD-10-CM | POA: Diagnosis not present

## 2018-06-17 DIAGNOSIS — M19012 Primary osteoarthritis, left shoulder: Secondary | ICD-10-CM | POA: Diagnosis not present

## 2018-06-17 DIAGNOSIS — M722 Plantar fascial fibromatosis: Secondary | ICD-10-CM | POA: Diagnosis not present

## 2018-06-17 DIAGNOSIS — M7542 Impingement syndrome of left shoulder: Secondary | ICD-10-CM | POA: Diagnosis not present

## 2018-06-22 DIAGNOSIS — M19012 Primary osteoarthritis, left shoulder: Secondary | ICD-10-CM | POA: Diagnosis not present

## 2018-06-22 DIAGNOSIS — M7542 Impingement syndrome of left shoulder: Secondary | ICD-10-CM | POA: Diagnosis not present

## 2018-06-22 DIAGNOSIS — M722 Plantar fascial fibromatosis: Secondary | ICD-10-CM | POA: Diagnosis not present

## 2018-06-24 DIAGNOSIS — M79671 Pain in right foot: Secondary | ICD-10-CM | POA: Diagnosis not present

## 2018-06-24 DIAGNOSIS — M7542 Impingement syndrome of left shoulder: Secondary | ICD-10-CM | POA: Diagnosis not present

## 2018-06-24 DIAGNOSIS — M722 Plantar fascial fibromatosis: Secondary | ICD-10-CM | POA: Diagnosis not present

## 2018-06-24 DIAGNOSIS — M19012 Primary osteoarthritis, left shoulder: Secondary | ICD-10-CM | POA: Diagnosis not present

## 2018-06-30 DIAGNOSIS — M722 Plantar fascial fibromatosis: Secondary | ICD-10-CM | POA: Diagnosis not present

## 2018-06-30 DIAGNOSIS — M19012 Primary osteoarthritis, left shoulder: Secondary | ICD-10-CM | POA: Diagnosis not present

## 2018-06-30 DIAGNOSIS — M7542 Impingement syndrome of left shoulder: Secondary | ICD-10-CM | POA: Diagnosis not present

## 2018-07-02 DIAGNOSIS — M19012 Primary osteoarthritis, left shoulder: Secondary | ICD-10-CM | POA: Diagnosis not present

## 2018-07-02 DIAGNOSIS — M7542 Impingement syndrome of left shoulder: Secondary | ICD-10-CM | POA: Diagnosis not present

## 2018-07-02 DIAGNOSIS — M722 Plantar fascial fibromatosis: Secondary | ICD-10-CM | POA: Diagnosis not present

## 2018-07-06 ENCOUNTER — Ambulatory Visit: Payer: Medicare Other | Admitting: Podiatry

## 2018-07-06 DIAGNOSIS — M7542 Impingement syndrome of left shoulder: Secondary | ICD-10-CM | POA: Diagnosis not present

## 2018-07-06 DIAGNOSIS — M19012 Primary osteoarthritis, left shoulder: Secondary | ICD-10-CM | POA: Diagnosis not present

## 2018-07-06 DIAGNOSIS — M722 Plantar fascial fibromatosis: Secondary | ICD-10-CM | POA: Diagnosis not present

## 2018-07-08 DIAGNOSIS — M722 Plantar fascial fibromatosis: Secondary | ICD-10-CM | POA: Diagnosis not present

## 2018-07-08 DIAGNOSIS — M7542 Impingement syndrome of left shoulder: Secondary | ICD-10-CM | POA: Diagnosis not present

## 2018-07-08 DIAGNOSIS — M19012 Primary osteoarthritis, left shoulder: Secondary | ICD-10-CM | POA: Diagnosis not present

## 2018-07-14 DIAGNOSIS — M722 Plantar fascial fibromatosis: Secondary | ICD-10-CM | POA: Diagnosis not present

## 2018-07-14 DIAGNOSIS — M7542 Impingement syndrome of left shoulder: Secondary | ICD-10-CM | POA: Diagnosis not present

## 2018-07-14 DIAGNOSIS — M19012 Primary osteoarthritis, left shoulder: Secondary | ICD-10-CM | POA: Diagnosis not present

## 2018-07-16 DIAGNOSIS — M722 Plantar fascial fibromatosis: Secondary | ICD-10-CM | POA: Diagnosis not present

## 2018-07-16 DIAGNOSIS — M7542 Impingement syndrome of left shoulder: Secondary | ICD-10-CM | POA: Diagnosis not present

## 2018-07-16 DIAGNOSIS — M19012 Primary osteoarthritis, left shoulder: Secondary | ICD-10-CM | POA: Diagnosis not present

## 2018-07-21 DIAGNOSIS — M19012 Primary osteoarthritis, left shoulder: Secondary | ICD-10-CM | POA: Diagnosis not present

## 2018-07-21 DIAGNOSIS — M7542 Impingement syndrome of left shoulder: Secondary | ICD-10-CM | POA: Diagnosis not present

## 2018-07-21 DIAGNOSIS — M722 Plantar fascial fibromatosis: Secondary | ICD-10-CM | POA: Diagnosis not present

## 2018-07-23 DIAGNOSIS — M7542 Impingement syndrome of left shoulder: Secondary | ICD-10-CM | POA: Diagnosis not present

## 2018-07-23 DIAGNOSIS — M722 Plantar fascial fibromatosis: Secondary | ICD-10-CM | POA: Diagnosis not present

## 2018-07-23 DIAGNOSIS — M19012 Primary osteoarthritis, left shoulder: Secondary | ICD-10-CM | POA: Diagnosis not present

## 2018-07-28 DIAGNOSIS — M7542 Impingement syndrome of left shoulder: Secondary | ICD-10-CM | POA: Diagnosis not present

## 2018-07-28 DIAGNOSIS — M19012 Primary osteoarthritis, left shoulder: Secondary | ICD-10-CM | POA: Diagnosis not present

## 2018-07-28 DIAGNOSIS — M722 Plantar fascial fibromatosis: Secondary | ICD-10-CM | POA: Diagnosis not present

## 2018-07-30 DIAGNOSIS — M722 Plantar fascial fibromatosis: Secondary | ICD-10-CM | POA: Diagnosis not present

## 2018-07-30 DIAGNOSIS — M19012 Primary osteoarthritis, left shoulder: Secondary | ICD-10-CM | POA: Diagnosis not present

## 2018-07-30 DIAGNOSIS — M7542 Impingement syndrome of left shoulder: Secondary | ICD-10-CM | POA: Diagnosis not present

## 2018-08-05 DIAGNOSIS — M19012 Primary osteoarthritis, left shoulder: Secondary | ICD-10-CM | POA: Diagnosis not present

## 2018-08-05 DIAGNOSIS — M7542 Impingement syndrome of left shoulder: Secondary | ICD-10-CM | POA: Diagnosis not present

## 2018-08-05 DIAGNOSIS — M722 Plantar fascial fibromatosis: Secondary | ICD-10-CM | POA: Diagnosis not present

## 2018-08-07 DIAGNOSIS — M19012 Primary osteoarthritis, left shoulder: Secondary | ICD-10-CM | POA: Diagnosis not present

## 2018-08-07 DIAGNOSIS — M7542 Impingement syndrome of left shoulder: Secondary | ICD-10-CM | POA: Diagnosis not present

## 2018-08-07 DIAGNOSIS — M722 Plantar fascial fibromatosis: Secondary | ICD-10-CM | POA: Diagnosis not present

## 2018-08-11 DIAGNOSIS — M722 Plantar fascial fibromatosis: Secondary | ICD-10-CM | POA: Diagnosis not present

## 2018-08-11 DIAGNOSIS — M7542 Impingement syndrome of left shoulder: Secondary | ICD-10-CM | POA: Diagnosis not present

## 2018-08-11 DIAGNOSIS — M19012 Primary osteoarthritis, left shoulder: Secondary | ICD-10-CM | POA: Diagnosis not present

## 2018-08-13 DIAGNOSIS — M722 Plantar fascial fibromatosis: Secondary | ICD-10-CM | POA: Diagnosis not present

## 2018-08-13 DIAGNOSIS — M19012 Primary osteoarthritis, left shoulder: Secondary | ICD-10-CM | POA: Diagnosis not present

## 2018-08-13 DIAGNOSIS — M7542 Impingement syndrome of left shoulder: Secondary | ICD-10-CM | POA: Diagnosis not present

## 2018-08-18 DIAGNOSIS — M19012 Primary osteoarthritis, left shoulder: Secondary | ICD-10-CM | POA: Diagnosis not present

## 2018-08-18 DIAGNOSIS — M7542 Impingement syndrome of left shoulder: Secondary | ICD-10-CM | POA: Diagnosis not present

## 2018-08-18 DIAGNOSIS — M722 Plantar fascial fibromatosis: Secondary | ICD-10-CM | POA: Diagnosis not present

## 2018-08-20 DIAGNOSIS — M19012 Primary osteoarthritis, left shoulder: Secondary | ICD-10-CM | POA: Diagnosis not present

## 2018-08-20 DIAGNOSIS — M7542 Impingement syndrome of left shoulder: Secondary | ICD-10-CM | POA: Diagnosis not present

## 2018-08-20 DIAGNOSIS — M722 Plantar fascial fibromatosis: Secondary | ICD-10-CM | POA: Diagnosis not present

## 2018-09-11 DIAGNOSIS — L910 Hypertrophic scar: Secondary | ICD-10-CM | POA: Diagnosis not present

## 2018-10-01 ENCOUNTER — Inpatient Hospital Stay: Payer: Medicare Other | Attending: Oncology

## 2018-10-01 ENCOUNTER — Other Ambulatory Visit: Payer: Self-pay

## 2018-10-01 ENCOUNTER — Telehealth: Payer: Self-pay | Admitting: Oncology

## 2018-10-01 ENCOUNTER — Inpatient Hospital Stay (HOSPITAL_BASED_OUTPATIENT_CLINIC_OR_DEPARTMENT_OTHER): Payer: Medicare Other | Admitting: Oncology

## 2018-10-01 VITALS — BP 113/65 | HR 66 | Temp 98.7°F | Resp 17 | Ht 72.0 in | Wt 234.6 lb

## 2018-10-01 DIAGNOSIS — D709 Neutropenia, unspecified: Secondary | ICD-10-CM | POA: Diagnosis not present

## 2018-10-01 DIAGNOSIS — Z791 Long term (current) use of non-steroidal anti-inflammatories (NSAID): Secondary | ICD-10-CM | POA: Insufficient documentation

## 2018-10-01 LAB — CBC WITH DIFFERENTIAL (CANCER CENTER ONLY)
Abs Immature Granulocytes: 0 10*3/uL (ref 0.00–0.07)
Basophils Absolute: 0 10*3/uL (ref 0.0–0.1)
Basophils Relative: 1 %
Eosinophils Absolute: 0.1 10*3/uL (ref 0.0–0.5)
Eosinophils Relative: 1 %
HCT: 41.8 % (ref 39.0–52.0)
Hemoglobin: 13.7 g/dL (ref 13.0–17.0)
Immature Granulocytes: 0 %
Lymphocytes Relative: 50 %
Lymphs Abs: 2.2 10*3/uL (ref 0.7–4.0)
MCH: 27.5 pg (ref 26.0–34.0)
MCHC: 32.8 g/dL (ref 30.0–36.0)
MCV: 83.8 fL (ref 80.0–100.0)
Monocytes Absolute: 0.4 10*3/uL (ref 0.1–1.0)
Monocytes Relative: 9 %
Neutro Abs: 1.7 10*3/uL (ref 1.7–7.7)
Neutrophils Relative %: 39 %
Platelet Count: 258 10*3/uL (ref 150–400)
RBC: 4.99 MIL/uL (ref 4.22–5.81)
RDW: 13.2 % (ref 11.5–15.5)
WBC Count: 4.3 10*3/uL (ref 4.0–10.5)
nRBC: 0 % (ref 0.0–0.2)

## 2018-10-01 LAB — CMP (CANCER CENTER ONLY)
ALT: 42 U/L (ref 0–44)
AST: 37 U/L (ref 15–41)
Albumin: 4.3 g/dL (ref 3.5–5.0)
Alkaline Phosphatase: 44 U/L (ref 38–126)
Anion gap: 8 (ref 5–15)
BUN: 20 mg/dL (ref 6–20)
CO2: 22 mmol/L (ref 22–32)
Calcium: 9.7 mg/dL (ref 8.9–10.3)
Chloride: 110 mmol/L (ref 98–111)
Creatinine: 1.35 mg/dL — ABNORMAL HIGH (ref 0.61–1.24)
GFR, Est AFR Am: >60 mL/min (ref >60)
GFR, Estimated: >60 mL/min (ref >60)
Glucose, Bld: 92 mg/dL (ref 70–99)
Potassium: 4.2 mmol/L (ref 3.5–5.1)
Sodium: 140 mmol/L (ref 135–145)
Total Bilirubin: 0.6 mg/dL (ref 0.3–1.2)
Total Protein: 7.5 g/dL (ref 6.5–8.1)

## 2018-10-01 NOTE — Telephone Encounter (Signed)
Called patient. No answer. No voicemail. Mailed printout  °

## 2018-10-01 NOTE — Progress Notes (Signed)
Hematology and Oncology Follow Up Visit  Grant Miller [id number] 01/06/1976 43 y.o. 10/01/2018 10:23 AM   Principle Diagnosis: 43 year old man with leukocytopenia and neutropenia related to benign etiologies diagnosed in 2013.     Current therapy: Active surveillance.  Interim History: Grant Miller returns today for repeat evaluation.  Since her last visit, he reports no major changes in his health.  He continues to be reasonably active and eats reasonably well.  He denies any recurrent infections or lymphadenopathy.  He denies any constitutional symptoms.  He denies excessive fatigue or tiredness.   Patient denied any alteration mental status, neuropathy, confusion or dizziness.  Denies any headaches or lethargy.  Denies any night sweats, weight loss or changes in appetite.  Denied orthopnea, dyspnea on exertion or chest discomfort.  Denies shortness of breath, difficulty breathing hemoptysis or cough.  Denies any abdominal distention, nausea, early satiety or dyspepsia.  Denies any hematuria, frequency, dysuria or nocturia.  Denies any skin irritation, dryness or rash.  Denies any ecchymosis or petechiae.  Denies any lymphadenopathy or clotting.  Denies any heat or cold intolerance.  Denies any anxiety or depression.  Remaining review of system is negative.     Medications: No changes in medication noted.  Current Outpatient Medications  Medication Sig Dispense Refill  . acetaminophen (TYLENOL) 500 MG tablet Take by mouth.    . cyclobenzaprine (FLEXERIL) 10 MG tablet Take 1 tablet (10 mg total) by mouth 2 (two) times daily as needed for muscle spasms. 20 tablet 0  . Diphenhydramine-APAP, sleep, (TYLENOL PM EXTRA STRENGTH PO) Take by mouth.    . fluticasone (FLONASE) 50 MCG/ACT nasal spray PLACE 2 SPRAYS INTO BOTH NOSTRILS DAILY 16 g 3  . naproxen (NAPROSYN) 375 MG tablet Take 1 tablet (375 mg total) by mouth 2 (two) times daily. 20 tablet 0  . sildenafil (VIAGRA) 100 MG tablet Take 0.5-1  tablets (50-100 mg total) by mouth daily as needed for erectile dysfunction. (Patient not taking: Reported on 03/19/2018) 5 tablet 11  . traMADol (ULTRAM) 50 MG tablet Take 1 tablet (50 mg total) by mouth every 8 (eight) hours as needed. 30 tablet 0   No current facility-administered medications for this visit.     Allergies:  Allergies  Allergen Reactions  . Iodine Hives and Shortness Of Breath  . Other Hives and Shortness Of Breath    All seafood causes this reaction  . Shellfish Allergy     Other reaction(s): SHORTNESS OF BREATH    Past Medical History, Surgical history, Social history, and Family History without any changes on review.   Physical Exam:  Blood pressure 113/65, pulse 66, temperature 98.7 F (37.1 C), temperature source Oral, resp. rate 17, height 6' (1.829 m), weight 234 lb 9.6 oz (106.4 kg), SpO2 99 %.   ECOG: 0   General appearance: Alert, awake without any distress. Head: Atraumatic without abnormalities Oropharynx: Without any thrush or ulcers. Eyes: No scleral icterus. Lymph nodes: No lymphadenopathy noted in the cervical, supraclavicular, or axillary nodes Heart:regular rate and rhythm, without any murmurs or gallops.   Lung: Clear to auscultation without any rhonchi, wheezes or dullness to percussion. Abdomin: Soft, nontender without any shifting dullness or ascites. Musculoskeletal: No clubbing or cyanosis. Neurological: No motor or sensory deficits. Skin: No rashes or lesions. Psychiatric: Mood and affect appeared normal.     Lab Results: Lab Results  Component Value Date   WBC 4.3 10/01/2018   HGB 13.7 10/01/2018   HCT 41.8 10/01/2018  MCV 83.8 10/01/2018   PLT 258 10/01/2018     Chemistry      Component Value Date/Time   NA 141 10/20/2017 0925   NA 142 08/16/2014 0759   K 4.1 10/20/2017 0925   K 3.8 08/16/2014 0759   CL 107 10/20/2017 0925   CO2 26 10/20/2017 0925   CO2 22 08/16/2014 0759   BUN 19 10/20/2017 0925   BUN 19.6  08/16/2014 0759   CREATININE 1.23 10/20/2017 0925   CREATININE 1.2 08/16/2014 0759      Component Value Date/Time   CALCIUM 10.3 10/20/2017 0925   CALCIUM 9.2 08/16/2014 0759   ALKPHOS 38 (L) 10/20/2017 0925   ALKPHOS 62 08/16/2014 0759   AST 32 10/20/2017 0925   AST 35 (H) 08/16/2014 0759   ALT 34 10/20/2017 0925   ALT 36 08/16/2014 0759   BILITOT 0.5 10/20/2017 0925   BILITOT 0.43 08/16/2014 0759       Impression and Plan:   43 year old man with:   1.  Leukocytopenia with neutropenia that has been fluctuating and related to benign etiologies..  CBC obtained on 10/01/2018 was personally reviewed and showed white cell count that is normal with absolute neutrophil count of 1700.  The differential diagnosis was reiterated today including benign fluctuating neutropenia versus early lymphoproliferative disorder which is unlikely at this time.  Overall the pattern of is neutropenia appears to be of a benign etiology and no further work-up is needed.  2. Health maintenance issues.  He is up-to-date with follow-up with his primary care physician regarding this issue.  3. Follow-up: I gave him the option of follow-up as needed but he would like to continue to repeat laboratory checkup in 6 months.   15 minutes was spent with the patient face-to-face today.  More than 50% of time was spent on reviewing his disease status, treatment options and answering questions regarding future plan of care.    Grant Button, MD 7/30/202010:23 AM

## 2018-12-09 DIAGNOSIS — M19012 Primary osteoarthritis, left shoulder: Secondary | ICD-10-CM | POA: Diagnosis not present

## 2018-12-09 DIAGNOSIS — M67912 Unspecified disorder of synovium and tendon, left shoulder: Secondary | ICD-10-CM | POA: Diagnosis not present

## 2019-03-31 ENCOUNTER — Other Ambulatory Visit: Payer: Self-pay | Admitting: Lab

## 2019-04-01 ENCOUNTER — Telehealth: Payer: Self-pay | Admitting: Oncology

## 2019-04-01 ENCOUNTER — Inpatient Hospital Stay: Payer: Medicare Other

## 2019-04-01 ENCOUNTER — Other Ambulatory Visit: Payer: Self-pay

## 2019-04-01 ENCOUNTER — Inpatient Hospital Stay: Payer: Medicare Other | Attending: Oncology | Admitting: Oncology

## 2019-04-01 VITALS — BP 125/68 | HR 55 | Temp 97.4°F | Resp 16 | Ht 72.0 in | Wt 233.5 lb

## 2019-04-01 DIAGNOSIS — D709 Neutropenia, unspecified: Secondary | ICD-10-CM | POA: Insufficient documentation

## 2019-04-01 DIAGNOSIS — Z125 Encounter for screening for malignant neoplasm of prostate: Secondary | ICD-10-CM

## 2019-04-01 LAB — CMP (CANCER CENTER ONLY)
ALT: 37 U/L (ref 0–44)
AST: 36 U/L (ref 15–41)
Albumin: 4.9 g/dL (ref 3.5–5.0)
Alkaline Phosphatase: 55 U/L (ref 38–126)
Anion gap: 10 (ref 5–15)
BUN: 21 mg/dL — ABNORMAL HIGH (ref 6–20)
CO2: 25 mmol/L (ref 22–32)
Calcium: 9.6 mg/dL (ref 8.9–10.3)
Chloride: 106 mmol/L (ref 98–111)
Creatinine: 1.23 mg/dL (ref 0.61–1.24)
GFR, Est AFR Am: >60 mL/min (ref >60)
GFR, Estimated: >60 mL/min (ref >60)
Glucose, Bld: 94 mg/dL (ref 70–99)
Potassium: 4.4 mmol/L (ref 3.5–5.1)
Sodium: 141 mmol/L (ref 135–145)
Total Bilirubin: 0.7 mg/dL (ref 0.3–1.2)
Total Protein: 7.9 g/dL (ref 6.5–8.1)

## 2019-04-01 LAB — CBC WITH DIFFERENTIAL (CANCER CENTER ONLY)
Abs Immature Granulocytes: 0 10*3/uL (ref 0.00–0.07)
Basophils Absolute: 0 10*3/uL (ref 0.0–0.1)
Basophils Relative: 1 %
Eosinophils Absolute: 0.1 10*3/uL (ref 0.0–0.5)
Eosinophils Relative: 2 %
HCT: 43.6 % (ref 39.0–52.0)
Hemoglobin: 14.5 g/dL (ref 13.0–17.0)
Immature Granulocytes: 0 %
Lymphocytes Relative: 57 %
Lymphs Abs: 2.2 10*3/uL (ref 0.7–4.0)
MCH: 27.5 pg (ref 26.0–34.0)
MCHC: 33.3 g/dL (ref 30.0–36.0)
MCV: 82.6 fL (ref 80.0–100.0)
Monocytes Absolute: 0.3 10*3/uL (ref 0.1–1.0)
Monocytes Relative: 9 %
Neutro Abs: 1.1 10*3/uL — ABNORMAL LOW (ref 1.7–7.7)
Neutrophils Relative %: 31 %
Platelet Count: 254 10*3/uL (ref 150–400)
RBC: 5.28 MIL/uL (ref 4.22–5.81)
RDW: 13.3 % (ref 11.5–15.5)
WBC Count: 3.7 10*3/uL — ABNORMAL LOW (ref 4.0–10.5)
nRBC: 0 % (ref 0.0–0.2)

## 2019-04-01 NOTE — Progress Notes (Signed)
Hematology and Oncology Follow Up Miller  Grant Miller [id number] November 02, 1975 44 y.o. 04/01/2019 8:01 AM   Principle Diagnosis: 44 year old man with neutropenia diagnosed in 2009.  The differential diagnosis includes benign fluctuating etiology versus lymphoproliferative disorder.    Current therapy: Active surveillance.  Interim History: Grant Miller is here for a follow-up Miller.  Since the last Miller, he reports no major changes in his health.  He denies any recent infections including pneumonia, bronchitis or skin infection.  He denies any recent hospitalization or illnesses.  He denies any constitutional symptoms.  He denies any urinary complaints.     Medications: Updated on review.  Current Outpatient Medications  Medication Sig Dispense Refill  . acetaminophen (TYLENOL) 500 MG tablet Take by mouth.    . cyclobenzaprine (FLEXERIL) 10 MG tablet Take 1 tablet (10 mg total) by mouth 2 (two) times daily as needed for muscle spasms. 20 tablet 0  . Diphenhydramine-APAP, sleep, (TYLENOL PM EXTRA STRENGTH PO) Take by mouth.    . fluticasone (FLONASE) 50 MCG/ACT nasal spray PLACE 2 SPRAYS INTO BOTH NOSTRILS DAILY 16 g 3  . naproxen (NAPROSYN) 375 MG tablet Take 1 tablet (375 mg total) by mouth 2 (two) times daily. 20 tablet 0  . sildenafil (VIAGRA) 100 MG tablet Take 0.5-1 tablets (50-100 mg total) by mouth daily as needed for erectile dysfunction. (Patient not taking: Reported on 03/19/2018) 5 tablet 11  . traMADol (ULTRAM) 50 MG tablet Take 1 tablet (50 mg total) by mouth every 8 (eight) hours as needed. 30 tablet 0   No current facility-administered medications for this Miller.    Allergies:  Allergies  Allergen Reactions  . Iodine Hives and Shortness Of Breath  . Other Hives and Shortness Of Breath    All seafood causes this reaction  . Shellfish Allergy     Other reaction(s): SHORTNESS OF BREATH       Physical Exam:  Blood pressure 125/68, pulse (!) 55, temperature (!) 97.4  F (36.3 C), temperature source Temporal, resp. rate 16, height 6' (1.829 m), weight 233 lb 8 oz (105.9 kg), SpO2 100 %.    ECOG: 0   General appearance: Comfortable appearing without any discomfort Head: Normocephalic without any trauma Oropharynx: Mucous membranes are moist and pink without any thrush or ulcers. Eyes: Pupils are equal and round reactive to light. Lymph nodes: No cervical, supraclavicular, inguinal or axillary lymphadenopathy.   Heart:regular rate and rhythm.  S1 and S2 without leg edema. Lung: Clear without any rhonchi or wheezes.  No dullness to percussion. Abdomin: Soft, nontender, nondistended with good bowel sounds.  No hepatosplenomegaly. Musculoskeletal: No joint deformity or effusion.  Full range of motion noted. Neurological: No deficits noted on motor, sensory and deep tendon reflex exam. Skin: No petechial rash or dryness.  Appeared moist.       Lab Results: Lab Results  Component Value Date   WBC 4.3 10/01/2018   HGB 13.7 10/01/2018   HCT 41.8 10/01/2018   MCV 83.8 10/01/2018   PLT 258 10/01/2018     Chemistry      Component Value Date/Time   NA 140 10/01/2018 1007   NA 142 08/16/2014 0759   K 4.2 10/01/2018 1007   K 3.8 08/16/2014 0759   CL 110 10/01/2018 1007   CO2 22 10/01/2018 1007   CO2 22 08/16/2014 0759   BUN 20 10/01/2018 1007   BUN 19.6 08/16/2014 0759   CREATININE 1.35 (H) 10/01/2018 1007   CREATININE 1.2 08/16/2014 0759  Component Value Date/Time   CALCIUM 9.7 10/01/2018 1007   CALCIUM 9.2 08/16/2014 0759   ALKPHOS 44 10/01/2018 1007   ALKPHOS 62 08/16/2014 0759   AST 37 10/01/2018 1007   AST 35 (H) 08/16/2014 0759   ALT 42 10/01/2018 1007   ALT 36 08/16/2014 0759   BILITOT 0.6 10/01/2018 1007   BILITOT 0.43 08/16/2014 0759       Impression and Plan:   44 year old man with:   1.  Neutropenia noted since 2009 with fluctuating counts with absolute neutrophil count ranging between 500 and 1500.    The  differential diagnosis was discussed again today.  Benign etiologies such as cyclic neutropenia and ethnic variation is the likely etiology at this time.  His total white cell count is 3.7 with absolute neutrophil count of 1100.  Number was 1700 6 months ago and 1500 year ago.  Remaining laboratory testing has been within normal range and I do not see any indication for further work-up at this time including bone marrow biopsy for imaging studies.  He is agreeable and understands the plan for the time being.  2. Health maintenance issues.  He remains up-to-date at this time and I urged him to continue to follow with his primary care physician regarding his other health issue.  3. Follow-up: I am happy to see him in the future as needed for a routine follow-up in 6 months.  Continues to prefer to follow-up every 6 months.  20 minutes was dedicated to this encounter.  The time was spent on reviewing laboratory data, differential diagnosis discussion and management options for the future.    Zola Button, MD 1/28/20218:01 AM

## 2019-04-01 NOTE — Telephone Encounter (Signed)
Scheduled appt per 1/28 los.  Sent a message to HIM pool to have a calendar mailed out.

## 2019-04-30 ENCOUNTER — Ambulatory Visit (INDEPENDENT_AMBULATORY_CARE_PROVIDER_SITE_OTHER): Payer: Medicare Other | Admitting: Internal Medicine

## 2019-04-30 ENCOUNTER — Other Ambulatory Visit: Payer: Self-pay

## 2019-04-30 ENCOUNTER — Encounter: Payer: Self-pay | Admitting: Internal Medicine

## 2019-04-30 VITALS — BP 132/86 | HR 80 | Temp 98.9°F | Ht 72.0 in | Wt 233.0 lb

## 2019-04-30 DIAGNOSIS — N529 Male erectile dysfunction, unspecified: Secondary | ICD-10-CM

## 2019-04-30 DIAGNOSIS — Z13220 Encounter for screening for lipoid disorders: Secondary | ICD-10-CM

## 2019-04-30 DIAGNOSIS — Z0000 Encounter for general adult medical examination without abnormal findings: Secondary | ICD-10-CM | POA: Diagnosis not present

## 2019-04-30 DIAGNOSIS — R7301 Impaired fasting glucose: Secondary | ICD-10-CM

## 2019-04-30 DIAGNOSIS — M545 Low back pain, unspecified: Secondary | ICD-10-CM

## 2019-04-30 DIAGNOSIS — G8929 Other chronic pain: Secondary | ICD-10-CM | POA: Diagnosis not present

## 2019-04-30 DIAGNOSIS — Z1283 Encounter for screening for malignant neoplasm of skin: Secondary | ICD-10-CM

## 2019-04-30 DIAGNOSIS — R41840 Attention and concentration deficit: Secondary | ICD-10-CM | POA: Insufficient documentation

## 2019-04-30 LAB — LIPID PANEL
Cholesterol: 150 mg/dL (ref 0–200)
HDL: 29.7 mg/dL — ABNORMAL LOW (ref >39.00)
LDL Cholesterol: 112 mg/dL — ABNORMAL HIGH (ref 0–99)
NonHDL: 119.8
Total CHOL/HDL Ratio: 5
Triglycerides: 41 mg/dL (ref 0.0–149.0)
VLDL: 8.2 mg/dL (ref 0.0–40.0)

## 2019-04-30 LAB — COMPREHENSIVE METABOLIC PANEL
ALT: 34 U/L (ref 0–53)
AST: 36 U/L (ref 0–37)
Albumin: 4.5 g/dL (ref 3.5–5.2)
Alkaline Phosphatase: 53 U/L (ref 39–117)
BUN: 22 mg/dL (ref 6–23)
CO2: 27 mEq/L (ref 19–32)
Calcium: 9.8 mg/dL (ref 8.4–10.5)
Chloride: 106 mEq/L (ref 96–112)
Creatinine, Ser: 1.2 mg/dL (ref 0.40–1.50)
GFR: 79.11 mL/min (ref >60.00)
Glucose, Bld: 96 mg/dL (ref 70–99)
Potassium: 4.3 mEq/L (ref 3.5–5.1)
Sodium: 141 mEq/L (ref 135–145)
Total Bilirubin: 0.5 mg/dL (ref 0.2–1.2)
Total Protein: 7.4 g/dL (ref 6.0–8.3)

## 2019-04-30 LAB — CBC
HCT: 40.1 % (ref 39.0–52.0)
Hemoglobin: 13.3 g/dL (ref 13.0–17.0)
MCHC: 33.1 g/dL (ref 30.0–36.0)
MCV: 84.8 fl (ref 78.0–100.0)
Platelets: 233 10*3/uL (ref 150.0–400.0)
RBC: 4.73 Mil/uL (ref 4.22–5.81)
RDW: 13.3 % (ref 11.5–15.5)
WBC: 3.5 10*3/uL — ABNORMAL LOW (ref 4.0–10.5)

## 2019-04-30 LAB — HEMOGLOBIN A1C: Hgb A1c MFr Bld: 6.1 % (ref 4.6–6.5)

## 2019-04-30 MED ORDER — IBUPROFEN 800 MG PO TABS: 800.0000 mg | ORAL_TABLET | Freq: Three times a day (TID) | ORAL | 1 refills | Status: DC | PRN

## 2019-04-30 NOTE — Assessment & Plan Note (Signed)
Checking HgA1c today and adjust as needed.  

## 2019-04-30 NOTE — Assessment & Plan Note (Signed)
Asked for ibuprofen 800 mg which was prescribed today.

## 2019-04-30 NOTE — Assessment & Plan Note (Signed)
Checking free testosterone level per patient request.

## 2019-04-30 NOTE — Progress Notes (Signed)
Subjective:   Patient ID: Grant Miller, male    DOB: 26-Jun-1975, 44 y.o.   MRN: [id number]  HPI Here for medicare wellness, no new complaints. Please see A/P for status and treatment of chronic medical problems.   HPI #2: Here for follow up impaired sugars (weight up about 20 pounds with pandemic, now working on diet and trying to walk several miles a day, denies excessive thirst or urination) and concerns about low testosterone (prolems with ED, some low energy, concerned about this) and attention problems (never tested, has had them since middle school, took a friend's adderall recently and felt the difference in his focus like night and day, wants to get tested and possibly on treatment).   Diet: heart healthy Physical activity: walks daily Depression/mood screen: negative Hearing: intact to whispered voice Visual acuity: grossly normal, overdue for annual eye exam  ADLs: capable Fall risk: none Home safety: good Cognitive evaluation: intact to orientation, naming, recall and repetition EOL planning: adv directives discussed    Office Visit from 04/30/2019 in [place] [facility] Total Score  0      I have personally reviewed and have noted 1. The patient's medical and social history - reviewed today no changes 2. Their use of alcohol, tobacco or illicit drugs 3. Their current medications and supplements 4. The patient's functional ability including ADL's, fall risks, home safety risks and hearing or visual impairment. 5. Diet and physical activities 6. Evidence for depression or mood disorders 7. Care team reviewed and updated  Patient Care Team: Hoyt Koch, MD as PCP - General (Internal Medicine) Past Medical History:  Diagnosis Date  . Depression    Past Surgical History:  Procedure Laterality Date  . ABDOMINAL SURGERY     Family History  Problem Relation Age of Onset  . Heart disease Mother   . Hyperlipidemia Mother   . Kidney  disease Mother   . Diabetes Mother     Review of Systems  Constitutional: Positive for fatigue.  HENT: Negative.   Eyes: Negative.   Respiratory: Negative for cough, chest tightness and shortness of breath.   Cardiovascular: Negative for chest pain, palpitations and leg swelling.  Gastrointestinal: Negative for abdominal distention, abdominal pain, constipation, diarrhea, nausea and vomiting.  Musculoskeletal: Negative.   Skin: Negative.   Neurological: Negative.   Psychiatric/Behavioral: Positive for decreased concentration.    Objective:  Physical Exam Constitutional:      Appearance: He is well-developed.  HENT:     Head: Normocephalic and atraumatic.  Cardiovascular:     Rate and Rhythm: Normal rate and regular rhythm.  Pulmonary:     Effort: Pulmonary effort is normal. No respiratory distress.     Breath sounds: Normal breath sounds. No wheezing or rales.  Abdominal:     General: Bowel sounds are normal. There is no distension.     Palpations: Abdomen is soft.     Tenderness: There is no abdominal tenderness. There is no rebound.     Comments: Abdominal scar midline stable from prior  Musculoskeletal:     Cervical back: Normal range of motion.  Skin:    General: Skin is warm and dry.  Neurological:     Mental Status: He is alert and oriented to person, place, and time.     Coordination: Coordination normal.     Vitals:   04/30/19 0800  BP: 132/86  Pulse: 80  Temp: 98.9 F (37.2 C)  TempSrc: Oral  SpO2: 98%  Weight: 233 lb (105.7 kg)  Height: 6' (1.829 m)    This visit occurred during the SARS-CoV-2 public health emergency.  Safety protocols were in place, including screening questions prior to the visit, additional usage of staff PPE, and extensive cleaning of exam room while observing appropriate contact time as indicated for disinfecting solutions.   Assessment & Plan:

## 2019-04-30 NOTE — Patient Instructions (Addendum)
Call [place] [facility] at [phone]  Health Maintenance, Male Adopting a healthy lifestyle and getting preventive care are important in promoting health and wellness. Ask your health care provider about:  The right schedule for you to have regular tests and exams.  Things you can do on your own to prevent diseases and keep yourself healthy. What should I know about diet, weight, and exercise? Eat a healthy diet   Eat a diet that includes plenty of vegetables, fruits, low-fat dairy products, and lean protein.  Do not eat a lot of foods that are high in solid fats, added sugars, or sodium. Maintain a healthy weight Body mass index (BMI) is a measurement that can be used to identify possible weight problems. It estimates body fat based on height and weight. Your health care provider can help determine your BMI and help you achieve or maintain a healthy weight. Get regular exercise Get regular exercise. This is one of the most important things you can do for your health. Most adults should:  Exercise for at least 150 minutes each week. The exercise should increase your heart rate and make you sweat (moderate-intensity exercise).  Do strengthening exercises at least twice a week. This is in addition to the moderate-intensity exercise.  Spend less time sitting. Even light physical activity can be beneficial. Watch cholesterol and blood lipids Have your blood tested for lipids and cholesterol at 46 years of age, then have this test every 5 years. You may need to have your cholesterol levels checked more often if:  Your lipid or cholesterol levels are high.  You are older than 46 years of age.  You are at high risk for heart disease. What should I know about cancer screening? Many types of cancers can be detected early and may often be prevented. Depending on your health history and family history, you may need to have cancer screening at various ages. This may include  screening for:  Colorectal cancer.  Prostate cancer.  Skin cancer.  Lung cancer. What should I know about heart disease, diabetes, and high blood pressure? -Blood pressure and heart disease  High blood pressure causes heart disease and increases the risk of stroke. This is more likely to develop in people who have high blood pressure readings, are of African descent, or are overweight.  Talk with your health care provider about your target blood pressure readings.  Have your blood pressure checked: ? Every 3-5 years if you are 5-67 years of age. ? Every year if you are 43 years old or older.  If you are between the ages of 36 and 30 and are a current or former smoker, ask your health care provider if you should have a one-time screening for abdominal aortic aneurysm (AAA). Diabetes Have regular diabetes screenings. This checks your fasting blood sugar level. Have the screening done:  Once every three years after age 25 if you are at a normal weight and have a low risk for diabetes.  More often and at a younger age if you are overweight or have a high risk for diabetes. What should I know about preventing infection? Hepatitis B If you have a higher risk for hepatitis B, you should be screened for this virus. -Talk with your health care provider to find out if you are at risk for hepatitis B infection. Hepatitis C Blood testing is recommended for:  Everyone born from [birth year] through 1967.  Anyone with known risk factors for hepatitis C. Sexually transmitted infections (STIs)  You should be screened each year for STIs, including gonorrhea and chlamydia, if: ? You are sexually active and are younger than 46 years of age. ? You are older than 46 years of age and your health care provider tells you that you are at risk for this type of infection. ? Your sexual activity has changed since you were last screened, and you are at increased risk for chlamydia or gonorrhea. Ask your health  care provider if you are at risk.  Ask your health care provider about whether you are at high risk for HIV. Your health care provider may recommend a prescription medicine to help prevent HIV infection. If you choose to take medicine to prevent HIV, you should first get tested for HIV. You should then be tested every 3 months for as long as you are taking the medicine. Follow these instructions at home: Lifestyle  Do not use any products that contain nicotine or tobacco, such as cigarettes, e-cigarettes, and chewing tobacco. If you need help quitting, ask your health care provider.  Do not use street drugs.  Do not share needles.  Ask your health care provider for help if you need support or information about quitting drugs. Alcohol use  Do not drink alcohol if your health care provider tells you not to drink.  If you drink alcohol: ? Limit how much you have to 0-2 drinks a day. ? Be aware of how much alcohol is in your drink. In the [place] one drink equals one 12 oz bottle of beer (355 mL), one 5 oz glass of wine (148 mL), or one 1 oz glass of hard liquor (44 mL). General instructions  Schedule regular health, dental, and eye exams.  Stay current with your vaccines.  Tell your health care provider if: ? You often feel depressed. ? You have ever been abused or do not feel safe at home. Summary  Adopting a healthy lifestyle and getting preventive care are important in promoting health and wellness.  Follow your health care provider's instructions about healthy diet, exercising, and getting tested or screened for diseases.  Follow your health care provider's instructions on monitoring your cholesterol and blood pressure. This information is not intended to replace advice given to you by your health care provider. Make sure you discuss any questions you have with your health care provider. Document Revised: 02/11/2018 Document Reviewed: 02/11/2018 Elsevier Patient Education  2020  [employer].

## 2019-04-30 NOTE — Assessment & Plan Note (Signed)
Advised to call [place] attention specialist for evaluation and treatment if necessary.

## 2019-04-30 NOTE — Assessment & Plan Note (Signed)
Flu shot declines. Tetanus declines. HIV screening declined. Counseled about sun safety and mole surveillance. Counseled about the dangers of distracted driving. Given 10 year screening recommendations.

## 2019-05-03 LAB — TESTOSTERONE TOTAL,FREE,BIO, MALES
Albumin: 4.6 g/dL (ref 3.6–5.1)
Sex Hormone Binding: 15 nmol/L (ref 10–50)
Testosterone, Bioavailable: 150.1 ng/dL (ref >=110.0)
Testosterone, Free: 71.5 pg/mL (ref 46.0–224.0)
Testosterone: 322 ng/dL (ref 250–827)

## 2019-05-14 DIAGNOSIS — H1045 Other chronic allergic conjunctivitis: Secondary | ICD-10-CM | POA: Diagnosis not present

## 2019-05-14 DIAGNOSIS — H47031 Optic nerve hypoplasia, right eye: Secondary | ICD-10-CM | POA: Diagnosis not present

## 2019-06-24 DIAGNOSIS — E291 Testicular hypofunction: Secondary | ICD-10-CM | POA: Diagnosis not present

## 2019-06-24 DIAGNOSIS — Z125 Encounter for screening for malignant neoplasm of prostate: Secondary | ICD-10-CM | POA: Diagnosis not present

## 2019-06-28 DIAGNOSIS — Z23 Encounter for immunization: Secondary | ICD-10-CM | POA: Diagnosis not present

## 2019-07-08 DIAGNOSIS — M2559 Pain in other specified joint: Secondary | ICD-10-CM | POA: Diagnosis not present

## 2019-07-08 DIAGNOSIS — R5382 Chronic fatigue, unspecified: Secondary | ICD-10-CM | POA: Diagnosis not present

## 2019-07-08 DIAGNOSIS — E291 Testicular hypofunction: Secondary | ICD-10-CM | POA: Diagnosis not present

## 2019-07-08 DIAGNOSIS — R6882 Decreased libido: Secondary | ICD-10-CM | POA: Diagnosis not present

## 2019-07-15 DIAGNOSIS — E291 Testicular hypofunction: Secondary | ICD-10-CM | POA: Diagnosis not present

## 2019-07-19 DIAGNOSIS — Z23 Encounter for immunization: Secondary | ICD-10-CM | POA: Diagnosis not present

## 2019-07-22 DIAGNOSIS — E291 Testicular hypofunction: Secondary | ICD-10-CM | POA: Diagnosis not present

## 2019-07-27 DIAGNOSIS — E291 Testicular hypofunction: Secondary | ICD-10-CM | POA: Diagnosis not present

## 2019-08-03 DIAGNOSIS — R6882 Decreased libido: Secondary | ICD-10-CM | POA: Diagnosis not present

## 2019-08-03 DIAGNOSIS — M2559 Pain in other specified joint: Secondary | ICD-10-CM | POA: Diagnosis not present

## 2019-08-03 DIAGNOSIS — R5382 Chronic fatigue, unspecified: Secondary | ICD-10-CM | POA: Diagnosis not present

## 2019-08-03 DIAGNOSIS — Z125 Encounter for screening for malignant neoplasm of prostate: Secondary | ICD-10-CM | POA: Diagnosis not present

## 2019-08-03 DIAGNOSIS — E291 Testicular hypofunction: Secondary | ICD-10-CM | POA: Diagnosis not present

## 2019-08-10 DIAGNOSIS — E291 Testicular hypofunction: Secondary | ICD-10-CM | POA: Diagnosis not present

## 2019-08-10 DIAGNOSIS — R6882 Decreased libido: Secondary | ICD-10-CM | POA: Diagnosis not present

## 2019-08-17 DIAGNOSIS — E291 Testicular hypofunction: Secondary | ICD-10-CM | POA: Diagnosis not present

## 2019-08-24 DIAGNOSIS — E291 Testicular hypofunction: Secondary | ICD-10-CM | POA: Diagnosis not present

## 2019-08-31 DIAGNOSIS — E291 Testicular hypofunction: Secondary | ICD-10-CM | POA: Diagnosis not present

## 2019-09-07 DIAGNOSIS — E291 Testicular hypofunction: Secondary | ICD-10-CM | POA: Diagnosis not present

## 2019-09-14 DIAGNOSIS — M19071 Primary osteoarthritis, right ankle and foot: Secondary | ICD-10-CM | POA: Diagnosis not present

## 2019-09-14 DIAGNOSIS — E291 Testicular hypofunction: Secondary | ICD-10-CM | POA: Diagnosis not present

## 2019-09-14 DIAGNOSIS — M722 Plantar fascial fibromatosis: Secondary | ICD-10-CM | POA: Diagnosis not present

## 2019-09-20 DIAGNOSIS — E291 Testicular hypofunction: Secondary | ICD-10-CM | POA: Diagnosis not present

## 2019-09-20 DIAGNOSIS — M722 Plantar fascial fibromatosis: Secondary | ICD-10-CM | POA: Diagnosis not present

## 2019-09-27 DIAGNOSIS — E291 Testicular hypofunction: Secondary | ICD-10-CM | POA: Diagnosis not present

## 2019-09-29 ENCOUNTER — Inpatient Hospital Stay: Payer: Medicare Other | Attending: Oncology | Admitting: Oncology

## 2019-09-29 ENCOUNTER — Inpatient Hospital Stay: Payer: Medicare Other

## 2019-09-29 ENCOUNTER — Other Ambulatory Visit: Payer: Self-pay

## 2019-09-29 VITALS — BP 137/83 | HR 109 | Temp 97.9°F | Resp 18 | Ht 72.0 in | Wt 228.3 lb

## 2019-09-29 DIAGNOSIS — D709 Neutropenia, unspecified: Secondary | ICD-10-CM

## 2019-09-29 DIAGNOSIS — E669 Obesity, unspecified: Secondary | ICD-10-CM | POA: Diagnosis not present

## 2019-09-29 DIAGNOSIS — N529 Male erectile dysfunction, unspecified: Secondary | ICD-10-CM | POA: Diagnosis not present

## 2019-09-29 DIAGNOSIS — Z125 Encounter for screening for malignant neoplasm of prostate: Secondary | ICD-10-CM

## 2019-09-29 LAB — CBC WITH DIFFERENTIAL (CANCER CENTER ONLY)
Abs Immature Granulocytes: 0.01 10*3/uL (ref 0.00–0.07)
Basophils Absolute: 0 10*3/uL (ref 0.0–0.1)
Basophils Relative: 0 %
Eosinophils Absolute: 0.1 10*3/uL (ref 0.0–0.5)
Eosinophils Relative: 1 %
HCT: 46.8 % (ref 39.0–52.0)
Hemoglobin: 15.2 g/dL (ref 13.0–17.0)
Immature Granulocytes: 0 %
Lymphocytes Relative: 34 %
Lymphs Abs: 1.8 10*3/uL (ref 0.7–4.0)
MCH: 26.6 pg (ref 26.0–34.0)
MCHC: 32.5 g/dL (ref 30.0–36.0)
MCV: 81.8 fL (ref 80.0–100.0)
Monocytes Absolute: 0.4 10*3/uL (ref 0.1–1.0)
Monocytes Relative: 7 %
Neutro Abs: 3 10*3/uL (ref 1.7–7.7)
Neutrophils Relative %: 58 %
Platelet Count: 283 10*3/uL (ref 150–400)
RBC: 5.72 MIL/uL (ref 4.22–5.81)
RDW: 13.8 % (ref 11.5–15.5)
WBC Count: 5.3 10*3/uL (ref 4.0–10.5)
nRBC: 0 % (ref 0.0–0.2)

## 2019-09-29 LAB — CMP (CANCER CENTER ONLY)
ALT: 41 U/L (ref 0–44)
AST: 41 U/L (ref 15–41)
Albumin: 4.6 g/dL (ref 3.5–5.0)
Alkaline Phosphatase: 45 U/L (ref 38–126)
Anion gap: 10 (ref 5–15)
BUN: 20 mg/dL (ref 6–20)
CO2: 22 mmol/L (ref 22–32)
Calcium: 10.6 mg/dL — ABNORMAL HIGH (ref 8.9–10.3)
Chloride: 109 mmol/L (ref 98–111)
Creatinine: 1.4 mg/dL — ABNORMAL HIGH (ref 0.61–1.24)
GFR, Est AFR Am: >60 mL/min (ref >60)
GFR, Estimated: >60 mL/min (ref >60)
Glucose, Bld: 101 mg/dL — ABNORMAL HIGH (ref 70–99)
Potassium: 4.2 mmol/L (ref 3.5–5.1)
Sodium: 141 mmol/L (ref 135–145)
Total Bilirubin: 0.5 mg/dL (ref 0.3–1.2)
Total Protein: 8.1 g/dL (ref 6.5–8.1)

## 2019-09-29 NOTE — Progress Notes (Signed)
Hematology and Oncology Follow Up Visit  Grant Miller [id number] [birth year] 44 y.o. 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    Current therapy: Active surveillance.  Interim History: Grant Miller returns today for repeat evaluation. Since the last visit,     Medications: Reviewed without changes.  Current Outpatient Medications  Medication Sig Dispense Refill  . acetaminophen (TYLENOL) 500 MG tablet Take by mouth.    . cyclobenzaprine (FLEXERIL) 10 MG tablet Take 1 tablet (10 mg total) by mouth 2 (two) times daily as needed for muscle spasms. 20 tablet 0  . Diphenhydramine-APAP, sleep, (TYLENOL PM EXTRA STRENGTH PO) Take by mouth.    . fluticasone (FLONASE) 50 MCG/ACT nasal spray PLACE 2 SPRAYS INTO BOTH NOSTRILS DAILY 16 g 3  . ibuprofen (ADVIL) 800 MG tablet Take 1 tablet (800 mg total) by mouth every 8 (eight) hours as needed. 90 tablet 1  . sildenafil (VIAGRA) 100 MG tablet Take 0.5-1 tablets (50-100 mg total) by mouth daily as needed for erectile dysfunction. 5 tablet 11   No current facility-administered medications for this visit.    Allergies:  Allergies  Allergen Reactions  . Iodine Hives and Shortness Of Breath  . Other Hives and Shortness Of Breath    All seafood causes this reaction  . Shellfish Allergy     Other reaction(s): SHORTNESS OF BREATH       Physical Exam:     ECOG: 0    General appearance: Alert, awake without any distress. Head: Atraumatic without abnormalities Oropharynx: Without any thrush or ulcers. Eyes: No scleral icterus. Lymph nodes: No lymphadenopathy noted in the cervical, supraclavicular, or axillary nodes Heart:regular rate and rhythm, without any murmurs or gallops.   Lung: Clear to auscultation without any rhonchi, wheezes or dullness to percussion. Abdomin: Soft, nontender without any shifting dullness or ascites. Musculoskeletal: No  clubbing or cyanosis. Neurological: No motor or sensory deficits. Skin: No rashes or lesions.      Lab Results: Lab Results  Component Value Date   WBC 3.5 (L) 04/30/2019   HGB 13.3 04/30/2019   HCT 40.1 04/30/2019   MCV 84.8 04/30/2019   PLT 233.0 04/30/2019     Chemistry      Component Value Date/Time   NA 141 04/30/2019 0823   NA 142 08/16/2014 0759   K 4.3 04/30/2019 0823   K 3.8 08/16/2014 0759   CL 106 04/30/2019 0823   CO2 27 04/30/2019 0823   CO2 22 08/16/2014 0759   BUN 22 04/30/2019 0823   BUN 19.6 08/16/2014 0759   CREATININE 1.20 04/30/2019 0823   CREATININE 1.23 04/01/2019 0748   CREATININE 1.2 08/16/2014 0759      Component Value Date/Time   CALCIUM 9.8 04/30/2019 0823   CALCIUM 9.2 08/16/2014 0759   ALKPHOS 53 04/30/2019 0823   ALKPHOS 62 08/16/2014 0759   AST 36 04/30/2019 0823   AST 36 04/01/2019 0748   AST 35 (H) 08/16/2014 0759   ALT 34 04/30/2019 0823   ALT 37 04/01/2019 0748   ALT 36 08/16/2014 0759   BILITOT 0.5 04/30/2019 0823   BILITOT 0.7 04/01/2019 0748   BILITOT 0.43 08/16/2014 0759       Impression and Plan:   44 year old man with:   1. Benign fluctuating neutropenia diagnosed in 2009. No evidence of a hematological disorder detected.  Laboratory data in the last 10 years reviewed and continues to show absolute neutrophil  count ranging between 1000 to normal range without any evidence of anemia thrombocytopenia or any other abnormalities in his peripheral smear. Differential diagnosis was reviewed again and no evidence of a lymphoproliferative disorder noted currently. He has not had any recurrent infections or hospitalizations. CBC from today was personally reviewed and showed adequate absolute neutrophil count currently.  Management options were reviewed today and I recommended continued active surveillance. No need for any growth factor support at this time.  2. Health maintenance issues. I continue to urged him to follow-up  with his primary care physician regarding health maintenance issues. He is up-to-date at this time.  He has completed Covid vaccination series at this time.  3. Follow-up: I gave him the option to follow-up as needed but he prefers to continue follow-up every 6 months.  20 minutes were spent on this visit. The time was dedicated to reviewing laboratory data, discussing treatment options and management pressures for the future.    Zola Button, MD 7/28/20217:58 AM

## 2019-09-30 LAB — PROSTATE-SPECIFIC AG, SERUM (LABCORP): Prostate Specific Ag, Serum: 0.8 ng/mL (ref 0.0–4.0)

## 2019-10-04 DIAGNOSIS — M71571 Other bursitis, not elsewhere classified, right ankle and foot: Secondary | ICD-10-CM | POA: Diagnosis not present

## 2019-10-04 DIAGNOSIS — E291 Testicular hypofunction: Secondary | ICD-10-CM | POA: Diagnosis not present

## 2019-10-04 DIAGNOSIS — M722 Plantar fascial fibromatosis: Secondary | ICD-10-CM | POA: Diagnosis not present

## 2019-10-11 DIAGNOSIS — E291 Testicular hypofunction: Secondary | ICD-10-CM | POA: Diagnosis not present

## 2019-10-12 ENCOUNTER — Telehealth: Payer: Self-pay | Admitting: Oncology

## 2019-10-12 NOTE — Telephone Encounter (Signed)
Scheduled per los, patient has been called and notified. 

## 2019-10-18 DIAGNOSIS — E291 Testicular hypofunction: Secondary | ICD-10-CM | POA: Diagnosis not present

## 2019-10-25 DIAGNOSIS — E291 Testicular hypofunction: Secondary | ICD-10-CM | POA: Diagnosis not present

## 2019-11-04 DIAGNOSIS — E291 Testicular hypofunction: Secondary | ICD-10-CM | POA: Diagnosis not present

## 2019-11-09 DIAGNOSIS — E291 Testicular hypofunction: Secondary | ICD-10-CM | POA: Diagnosis not present

## 2019-11-15 DIAGNOSIS — E291 Testicular hypofunction: Secondary | ICD-10-CM | POA: Diagnosis not present

## 2019-11-22 DIAGNOSIS — E291 Testicular hypofunction: Secondary | ICD-10-CM | POA: Diagnosis not present

## 2019-11-30 DIAGNOSIS — E291 Testicular hypofunction: Secondary | ICD-10-CM | POA: Diagnosis not present

## 2019-12-06 DIAGNOSIS — R5382 Chronic fatigue, unspecified: Secondary | ICD-10-CM | POA: Diagnosis not present

## 2019-12-06 DIAGNOSIS — E291 Testicular hypofunction: Secondary | ICD-10-CM | POA: Diagnosis not present

## 2019-12-08 ENCOUNTER — Encounter: Payer: Self-pay | Admitting: Internal Medicine

## 2019-12-08 ENCOUNTER — Other Ambulatory Visit: Payer: Self-pay

## 2019-12-08 ENCOUNTER — Ambulatory Visit (INDEPENDENT_AMBULATORY_CARE_PROVIDER_SITE_OTHER): Payer: Medicare Other | Admitting: Internal Medicine

## 2019-12-08 VITALS — BP 120/70 | HR 68 | Temp 98.9°F | Ht 72.0 in | Wt 227.0 lb

## 2019-12-08 DIAGNOSIS — M545 Low back pain, unspecified: Secondary | ICD-10-CM | POA: Diagnosis not present

## 2019-12-08 DIAGNOSIS — M47816 Spondylosis without myelopathy or radiculopathy, lumbar region: Secondary | ICD-10-CM | POA: Diagnosis not present

## 2019-12-08 DIAGNOSIS — G5603 Carpal tunnel syndrome, bilateral upper limbs: Secondary | ICD-10-CM | POA: Diagnosis not present

## 2019-12-08 DIAGNOSIS — M519 Unspecified thoracic, thoracolumbar and lumbosacral intervertebral disc disorder: Secondary | ICD-10-CM

## 2019-12-08 MED ORDER — PREDNISONE 10 MG PO TABS: ORAL_TABLET | ORAL | 0 refills | Status: DC

## 2019-12-08 MED ORDER — TRAMADOL HCL 50 MG PO TABS: 50.0000 mg | ORAL_TABLET | Freq: Four times a day (QID) | ORAL | 0 refills | Status: DC | PRN

## 2019-12-08 NOTE — Patient Instructions (Signed)
Please take all new medication as prescribed - the prednisone for the hands and back, as well as the tramadol for pain as needed  Please also wear wrist splints on both wrists at night only to hopefully help with the pain during the next day  You will be contacted regarding the referral for: Sports Medicine  Please continue all other medications as before, and refills have been done if requested.  Please have the pharmacy call with any other refills you may need.  Please keep your appointments with your specialists as you may have planned

## 2019-12-08 NOTE — Progress Notes (Signed)
Subjective:    Patient ID: Grant Miller, male    DOB: Oct 21, 1975, 44 y.o.   MRN: [id number]  HPI  Here with bilateral hand pain and tingling daily for the past wk, better in the am, worse in the pM after working during the day, mild, constant, not better with gloves use at night.  Pt also c/o mild left recurring LBP without change in severity, bowel or bladder change, fever, wt loss,  worsening LE pain/numbness/weakness, gait change or falls. Pt denies chest pain, increased sob or doe, wheezing, orthopnea, PND, increased LE swelling, palpitations, dizziness or syncope.   Pt denies polydipsia, polyuria\ Past Medical History:  Diagnosis Date  . Depression    Past Surgical History:  Procedure Laterality Date  . ABDOMINAL SURGERY      reports that he has never smoked. He has never used smokeless tobacco. He reports that he does not drink alcohol and does not use drugs. family history includes Diabetes in his mother; Heart disease in his mother; Hyperlipidemia in his mother; Kidney disease in his mother. Allergies  Allergen Reactions  . Iodine Hives and Shortness Of Breath  . Other Hives and Shortness Of Breath    All seafood causes this reaction  . Shellfish Allergy     Other reaction(s): SHORTNESS OF BREATH   Current Outpatient Medications on File Prior to Visit  Medication Sig Dispense Refill  . acetaminophen (TYLENOL) 500 MG tablet Take by mouth.    Marland Kitchen anastrozole (ARIMIDEX) 1 MG tablet Take by mouth.    . cyclobenzaprine (FLEXERIL) 10 MG tablet Take 1 tablet (10 mg total) by mouth 2 (two) times daily as needed for muscle spasms. 20 tablet 0  . Diphenhydramine-APAP, sleep, (TYLENOL PM EXTRA STRENGTH PO) Take by mouth.    . fluticasone (FLONASE) 50 MCG/ACT nasal spray PLACE 2 SPRAYS INTO BOTH NOSTRILS DAILY 16 g 3  . ibuprofen (ADVIL) 800 MG tablet Take 1 tablet (800 mg total) by mouth every 8 (eight) hours as needed. 90 tablet 1  . meloxicam (MOBIC) 7.5 MG tablet Take 7.5 mg by mouth  daily.    . sildenafil (VIAGRA) 100 MG tablet Take 0.5-1 tablets (50-100 mg total) by mouth daily as needed for erectile dysfunction. 5 tablet 11   No current facility-administered medications on file prior to visit.   Review of Systems All otherwise neg per pt    Objective:   Physical Exam BP 120/70 (BP Location: Left Arm, Patient Position: Sitting, Cuff Size: Large)   Pulse 68   Temp 98.9 F (37.2 C) (Oral)   Ht 6' (1.829 m)   Wt 227 lb (103 kg)   SpO2 97%   BMI 30.79 kg/m  VS noted,  Constitutional: Pt appears in NAD HENT: Head: NCAT.  Right Ear: External ear normal.  Left Ear: External ear normal.  Eyes: . Pupils are equal, round, and reactive to light. Conjunctivae and EOM are normal Nose: without d/c or deformity Neck: Neck supple. Gross normal ROM Cardiovascular: Normal rate and regular rhythm.   Pulmonary/Chest: Effort normal and breath sounds without rales or wheezing.  Abd:  Soft, NT, ND, + BS, no organomegaly Spine nontender - but has mild left lumbar paravertebral tender spasm Neurological: Pt is alert. At baseline orientation, motor grossly intact Skin: Skin is warm. No rashes, other new lesions, no LE edema Psychiatric: Pt behavior is normal without agitation  All otherwise neg per pt Lab Results  Component Value Date   WBC 5.3 09/29/2019   HGB  15.2 09/29/2019   HCT 46.8 09/29/2019   PLT 283 09/29/2019   GLUCOSE 101 (H) 09/29/2019   CHOL 150 04/30/2019   TRIG 41.0 04/30/2019   HDL 29.70 (L) 04/30/2019   LDLCALC 112 (H) 04/30/2019   ALT 41 09/29/2019   AST 41 09/29/2019   NA 141 09/29/2019   K 4.2 09/29/2019   CL 109 09/29/2019   CREATININE 1.40 (H) 09/29/2019   BUN 20 09/29/2019   CO2 22 09/29/2019   TSH 0.50 10/20/2017   HGBA1C 6.1 04/30/2019      Lab Results  Component Value Date   WBC 5.3 09/29/2019   HGB 15.2 09/29/2019   HCT 46.8 09/29/2019   PLT 283 09/29/2019   GLUCOSE 101 (H) 09/29/2019   CHOL 150 04/30/2019   TRIG 41.0  04/30/2019   HDL 29.70 (L) 04/30/2019   LDLCALC 112 (H) 04/30/2019   ALT 41 09/29/2019   AST 41 09/29/2019   NA 141 09/29/2019   K 4.2 09/29/2019   CL 109 09/29/2019   CREATININE 1.40 (H) 09/29/2019   BUN 20 09/29/2019   CO2 22 09/29/2019   TSH 0.50 10/20/2017   HGBA1C 6.1 04/30/2019       Assessment & Plan:

## 2019-12-12 ENCOUNTER — Encounter: Payer: Self-pay | Admitting: Internal Medicine

## 2019-12-12 NOTE — Assessment & Plan Note (Signed)
For tramadol, predpac asd, refer sports medicine

## 2019-12-12 NOTE — Assessment & Plan Note (Signed)
No radicular symptoms, for tx as above,  to f/u any worsening symptoms or concerns

## 2019-12-12 NOTE — Assessment & Plan Note (Addendum)
For predpac asd, wrist splints at night, and tramadol prn,  to f/u any worsening symptoms or concerns  I spent 31 minutes in preparing to see the patient by review of recent labs, imaging and procedures, obtaining and reviewing separately obtained history, communicating with the patient and family or caregiver, ordering medications, tests or procedures, and documenting clinical information in the EHR including the differential Dx, treatment, and any further evaluation and other management of bilat hand pain, lumbar disc disease, lumbar djd

## 2019-12-15 DIAGNOSIS — M2559 Pain in other specified joint: Secondary | ICD-10-CM | POA: Diagnosis not present

## 2019-12-15 DIAGNOSIS — N500 Atrophy of testis: Secondary | ICD-10-CM | POA: Diagnosis not present

## 2019-12-15 DIAGNOSIS — E291 Testicular hypofunction: Secondary | ICD-10-CM | POA: Diagnosis not present

## 2019-12-15 DIAGNOSIS — R5382 Chronic fatigue, unspecified: Secondary | ICD-10-CM | POA: Diagnosis not present

## 2019-12-17 ENCOUNTER — Encounter: Payer: Self-pay | Admitting: Family Medicine

## 2020-01-31 DIAGNOSIS — E291 Testicular hypofunction: Secondary | ICD-10-CM | POA: Diagnosis not present

## 2020-02-07 DIAGNOSIS — E291 Testicular hypofunction: Secondary | ICD-10-CM | POA: Diagnosis not present

## 2020-02-14 DIAGNOSIS — E291 Testicular hypofunction: Secondary | ICD-10-CM | POA: Diagnosis not present

## 2020-02-22 DIAGNOSIS — E291 Testicular hypofunction: Secondary | ICD-10-CM | POA: Diagnosis not present

## 2020-02-23 DIAGNOSIS — Z23 Encounter for immunization: Secondary | ICD-10-CM | POA: Diagnosis not present

## 2020-02-29 DIAGNOSIS — E291 Testicular hypofunction: Secondary | ICD-10-CM | POA: Diagnosis not present

## 2020-03-06 DIAGNOSIS — E291 Testicular hypofunction: Secondary | ICD-10-CM | POA: Diagnosis not present

## 2020-03-14 DIAGNOSIS — E291 Testicular hypofunction: Secondary | ICD-10-CM | POA: Diagnosis not present

## 2020-03-21 DIAGNOSIS — R5382 Chronic fatigue, unspecified: Secondary | ICD-10-CM | POA: Diagnosis not present

## 2020-03-21 DIAGNOSIS — Z79890 Hormone replacement therapy (postmenopausal): Secondary | ICD-10-CM | POA: Diagnosis not present

## 2020-03-21 DIAGNOSIS — E291 Testicular hypofunction: Secondary | ICD-10-CM | POA: Diagnosis not present

## 2020-03-28 ENCOUNTER — Other Ambulatory Visit: Payer: Self-pay | Admitting: *Deleted

## 2020-03-28 ENCOUNTER — Other Ambulatory Visit: Payer: Self-pay

## 2020-03-28 ENCOUNTER — Inpatient Hospital Stay: Payer: Medicare Other | Attending: Oncology

## 2020-03-28 ENCOUNTER — Inpatient Hospital Stay (HOSPITAL_BASED_OUTPATIENT_CLINIC_OR_DEPARTMENT_OTHER): Payer: Medicare Other | Admitting: Oncology

## 2020-03-28 VITALS — BP 152/89 | HR 79 | Temp 97.1°F | Resp 16 | Ht 72.0 in | Wt 217.8 lb

## 2020-03-28 DIAGNOSIS — Z125 Encounter for screening for malignant neoplasm of prostate: Secondary | ICD-10-CM

## 2020-03-28 DIAGNOSIS — E291 Testicular hypofunction: Secondary | ICD-10-CM | POA: Diagnosis not present

## 2020-03-28 DIAGNOSIS — D709 Neutropenia, unspecified: Secondary | ICD-10-CM | POA: Insufficient documentation

## 2020-03-28 LAB — CMP (CANCER CENTER ONLY)
ALT: 43 U/L (ref 0–44)
AST: 38 U/L (ref 15–41)
Albumin: 4.3 g/dL (ref 3.5–5.0)
Alkaline Phosphatase: 46 U/L (ref 38–126)
Anion gap: 8 (ref 5–15)
BUN: 10 mg/dL (ref 6–20)
CO2: 26 mmol/L (ref 22–32)
Calcium: 9.4 mg/dL (ref 8.9–10.3)
Chloride: 108 mmol/L (ref 98–111)
Creatinine: 1.2 mg/dL (ref 0.61–1.24)
GFR, Estimated: >60 mL/min (ref >60)
Glucose, Bld: 97 mg/dL (ref 70–99)
Potassium: 4 mmol/L (ref 3.5–5.1)
Sodium: 142 mmol/L (ref 135–145)
Total Bilirubin: 0.6 mg/dL (ref 0.3–1.2)
Total Protein: 7.3 g/dL (ref 6.5–8.1)

## 2020-03-28 LAB — CBC WITH DIFFERENTIAL (CANCER CENTER ONLY)
Abs Immature Granulocytes: 0 10*3/uL (ref 0.00–0.07)
Basophils Absolute: 0 10*3/uL (ref 0.0–0.1)
Basophils Relative: 1 %
Eosinophils Absolute: 0 10*3/uL (ref 0.0–0.5)
Eosinophils Relative: 1 %
HCT: 46 % (ref 39.0–52.0)
Hemoglobin: 14.6 g/dL (ref 13.0–17.0)
Immature Granulocytes: 0 %
Lymphocytes Relative: 43 %
Lymphs Abs: 1.9 10*3/uL (ref 0.7–4.0)
MCH: 26.7 pg (ref 26.0–34.0)
MCHC: 31.7 g/dL (ref 30.0–36.0)
MCV: 84.1 fL (ref 80.0–100.0)
Monocytes Absolute: 0.4 10*3/uL (ref 0.1–1.0)
Monocytes Relative: 9 %
Neutro Abs: 2.1 10*3/uL (ref 1.7–7.7)
Neutrophils Relative %: 46 %
Platelet Count: 251 10*3/uL (ref 150–400)
RBC: 5.47 MIL/uL (ref 4.22–5.81)
RDW: 14.1 % (ref 11.5–15.5)
WBC Count: 4.5 10*3/uL (ref 4.0–10.5)
nRBC: 0 % (ref 0.0–0.2)

## 2020-03-28 NOTE — Progress Notes (Signed)
Hematology and Oncology Follow Up Visit  Grant Miller [id number] 09/08/1975 45 y.o. 03/28/2020 12:35 PM   Principle Diagnosis: 45 year old man with neutropenia diagnosed in 2009.  He was found to mild decline related to benign ethnic variation without any intervention.   Current therapy: Active surveillance.   Interim History: Grant Miller reports no complaints at this time.  He denies any recent hospitalization or illnesses.  He denies any nausea, vomiting or abdominal pain.  He denies any recurrent infections or hospitalizations.  He is currently working at [employer] and able to perform most activities of daily living.     Medications: Updated on review.  Current Outpatient Medications  Medication Sig Dispense Refill  . acetaminophen (TYLENOL) 500 MG tablet Take by mouth.    Marland Kitchen anastrozole (ARIMIDEX) 1 MG tablet Take by mouth.    . cyclobenzaprine (FLEXERIL) 10 MG tablet Take 1 tablet (10 mg total) by mouth 2 (two) times daily as needed for muscle spasms. 20 tablet 0  . Diphenhydramine-APAP, sleep, (TYLENOL PM EXTRA STRENGTH PO) Take by mouth.    . fluticasone (FLONASE) 50 MCG/ACT nasal spray PLACE 2 SPRAYS INTO BOTH NOSTRILS DAILY 16 g 3  . ibuprofen (ADVIL) 800 MG tablet Take 1 tablet (800 mg total) by mouth every 8 (eight) hours as needed. 90 tablet 1  . meloxicam (MOBIC) 7.5 MG tablet Take 7.5 mg by mouth daily.    . predniSONE (DELTASONE) 10 MG tablet 3 tabs by mouth per day for 3 days,2tabs per day for 3 days,1tab per day for 3 days 18 tablet 0  . sildenafil (VIAGRA) 100 MG tablet Take 0.5-1 tablets (50-100 mg total) by mouth daily as needed for erectile dysfunction. 5 tablet 11  . traMADol (ULTRAM) 50 MG tablet Take 1 tablet (50 mg total) by mouth every 6 (six) hours as needed. 30 tablet 0   No current facility-administered medications for this visit.    Allergies:  Allergies  Allergen Reactions  . Iodine Hives and Shortness Of Breath  . Other Hives and  Shortness Of Breath    All seafood causes this reaction  . Shellfish Allergy     Other reaction(s): SHORTNESS OF BREATH       Physical Exam:  Blood pressure (!) 152/89, pulse 79, temperature (!) 97.1 F (36.2 C), temperature source Tympanic, resp. rate 16, height 6' (1.829 m), weight 217 lb 12.8 oz (98.8 kg), SpO2 100 %.     ECOG: 0     General appearance: Comfortable appearing without any discomfort Head: Normocephalic without any trauma Oropharynx: Mucous membranes are moist and pink without any thrush or ulcers. Eyes: Pupils are equal and round reactive to light. Lymph nodes: No cervical, supraclavicular, inguinal or axillary lymphadenopathy.   Heart:regular rate and rhythm.  S1 and S2 without leg edema. Lung: Clear without any rhonchi or wheezes.  No dullness to percussion. Abdomin: Soft, nontender, nondistended with good bowel sounds.  No hepatosplenomegaly. Musculoskeletal: No joint deformity or effusion.  Full range of motion noted. Neurological: No deficits noted on motor, sensory and deep tendon reflex exam. Skin: No petechial rash or dryness.  Appeared moist.         Lab Results: Lab Results  Component Value Date   WBC 4.5 03/28/2020   HGB 14.6 03/28/2020   HCT 46.0 03/28/2020   MCV 84.1 03/28/2020   PLT 251 03/28/2020     Chemistry      Component Value Date/Time   NA 141 09/29/2019 0750  NA 142 08/16/2014 0759   K 4.2 09/29/2019 0750   K 3.8 08/16/2014 0759   CL 109 09/29/2019 0750   CO2 22 09/29/2019 0750   CO2 22 08/16/2014 0759   BUN 20 09/29/2019 0750   BUN 19.6 08/16/2014 0759   CREATININE 1.40 (H) 09/29/2019 0750   CREATININE 1.2 08/16/2014 0759      Component Value Date/Time   CALCIUM 10.6 (H) 09/29/2019 0750   CALCIUM 9.2 08/16/2014 0759   ALKPHOS 45 09/29/2019 0750   ALKPHOS 62 08/16/2014 0759   AST 41 09/29/2019 0750   AST 35 (H) 08/16/2014 0759   ALT 41 09/29/2019 0750   ALT 36 08/16/2014 0759   BILITOT 0.5 09/29/2019 0750    BILITOT 0.43 08/16/2014 0759       Impression and Plan:   45 year old man with:   1.  Neutropenia related to benign ethnic variation noted in 2009.  He has absolute neutrophil count that is mild close to normal range.   The natural course of his disease was reviewed.  Laboratory data from today were reviewed and discussed that showed close to normal counts at this time.  I have recommended continued active surveillance without any intervention at this time.    2. Health maintenance issues.  He is up-to-date and continues to follow with primary care physician regarding this issue.  3. Follow-up: Will be as needed.  He does not need for regular hematology follow-up.  20 minutes were dedicated to this encounter.  Time spent on reviewing disease status, reviewing laboratory data and future plan of care.    Zola Button, MD 1/25/202212:35 PM

## 2020-03-29 ENCOUNTER — Ambulatory Visit: Payer: Medicare Other | Admitting: Oncology

## 2020-03-29 ENCOUNTER — Inpatient Hospital Stay: Payer: Medicare Other | Admitting: Oncology

## 2020-03-29 ENCOUNTER — Other Ambulatory Visit: Payer: Medicare Other

## 2020-03-29 ENCOUNTER — Inpatient Hospital Stay: Payer: Medicare Other

## 2020-03-29 LAB — PROSTATE-SPECIFIC AG, SERUM (LABCORP): Prostate Specific Ag, Serum: 0.8 ng/mL (ref 0.0–4.0)

## 2020-04-04 DIAGNOSIS — E291 Testicular hypofunction: Secondary | ICD-10-CM | POA: Diagnosis not present

## 2020-04-04 DIAGNOSIS — R5382 Chronic fatigue, unspecified: Secondary | ICD-10-CM | POA: Diagnosis not present

## 2020-04-04 DIAGNOSIS — R6882 Decreased libido: Secondary | ICD-10-CM | POA: Diagnosis not present

## 2020-04-10 DIAGNOSIS — E291 Testicular hypofunction: Secondary | ICD-10-CM | POA: Diagnosis not present

## 2020-04-19 DIAGNOSIS — E291 Testicular hypofunction: Secondary | ICD-10-CM | POA: Diagnosis not present

## 2020-04-27 DIAGNOSIS — E291 Testicular hypofunction: Secondary | ICD-10-CM | POA: Diagnosis not present

## 2020-05-03 DIAGNOSIS — E291 Testicular hypofunction: Secondary | ICD-10-CM | POA: Diagnosis not present

## 2020-05-10 DIAGNOSIS — M542 Cervicalgia: Secondary | ICD-10-CM | POA: Diagnosis not present

## 2020-05-10 DIAGNOSIS — M67912 Unspecified disorder of synovium and tendon, left shoulder: Secondary | ICD-10-CM | POA: Diagnosis not present

## 2020-05-10 DIAGNOSIS — E291 Testicular hypofunction: Secondary | ICD-10-CM | POA: Diagnosis not present

## 2020-05-10 DIAGNOSIS — M19012 Primary osteoarthritis, left shoulder: Secondary | ICD-10-CM | POA: Diagnosis not present

## 2020-05-17 DIAGNOSIS — E291 Testicular hypofunction: Secondary | ICD-10-CM | POA: Diagnosis not present

## 2020-05-24 DIAGNOSIS — E291 Testicular hypofunction: Secondary | ICD-10-CM | POA: Diagnosis not present

## 2020-05-31 DIAGNOSIS — E291 Testicular hypofunction: Secondary | ICD-10-CM | POA: Diagnosis not present

## 2020-06-07 DIAGNOSIS — M19012 Primary osteoarthritis, left shoulder: Secondary | ICD-10-CM | POA: Diagnosis not present

## 2020-06-07 DIAGNOSIS — S134XXD Sprain of ligaments of cervical spine, subsequent encounter: Secondary | ICD-10-CM | POA: Diagnosis not present

## 2020-07-06 DIAGNOSIS — E291 Testicular hypofunction: Secondary | ICD-10-CM | POA: Diagnosis not present

## 2020-07-19 DIAGNOSIS — S134XXD Sprain of ligaments of cervical spine, subsequent encounter: Secondary | ICD-10-CM | POA: Diagnosis not present

## 2020-07-19 DIAGNOSIS — M19012 Primary osteoarthritis, left shoulder: Secondary | ICD-10-CM | POA: Diagnosis not present

## 2020-07-21 ENCOUNTER — Ambulatory Visit: Payer: Medicare Other | Admitting: Internal Medicine

## 2020-08-02 DIAGNOSIS — S134XXD Sprain of ligaments of cervical spine, subsequent encounter: Secondary | ICD-10-CM | POA: Diagnosis not present

## 2020-08-02 DIAGNOSIS — E291 Testicular hypofunction: Secondary | ICD-10-CM | POA: Diagnosis not present

## 2020-08-02 DIAGNOSIS — M19012 Primary osteoarthritis, left shoulder: Secondary | ICD-10-CM | POA: Diagnosis not present

## 2020-08-09 DIAGNOSIS — E291 Testicular hypofunction: Secondary | ICD-10-CM | POA: Diagnosis not present

## 2020-08-16 DIAGNOSIS — E291 Testicular hypofunction: Secondary | ICD-10-CM | POA: Diagnosis not present

## 2020-08-17 ENCOUNTER — Ambulatory Visit (INDEPENDENT_AMBULATORY_CARE_PROVIDER_SITE_OTHER): Payer: BC Managed Care – PPO

## 2020-08-17 ENCOUNTER — Other Ambulatory Visit: Payer: Self-pay

## 2020-08-17 ENCOUNTER — Encounter: Payer: Self-pay | Admitting: Internal Medicine

## 2020-08-17 ENCOUNTER — Ambulatory Visit (INDEPENDENT_AMBULATORY_CARE_PROVIDER_SITE_OTHER): Payer: BC Managed Care – PPO | Admitting: Internal Medicine

## 2020-08-17 VITALS — BP 130/80 | HR 80 | Temp 98.8°F | Ht 72.0 in | Wt 219.4 lb

## 2020-08-17 DIAGNOSIS — Z1211 Encounter for screening for malignant neoplasm of colon: Secondary | ICD-10-CM | POA: Diagnosis not present

## 2020-08-17 DIAGNOSIS — G8929 Other chronic pain: Secondary | ICD-10-CM

## 2020-08-17 DIAGNOSIS — M4802 Spinal stenosis, cervical region: Secondary | ICD-10-CM | POA: Diagnosis not present

## 2020-08-17 DIAGNOSIS — M25512 Pain in left shoulder: Secondary | ICD-10-CM

## 2020-08-17 DIAGNOSIS — R7301 Impaired fasting glucose: Secondary | ICD-10-CM

## 2020-08-17 DIAGNOSIS — M542 Cervicalgia: Secondary | ICD-10-CM

## 2020-08-17 LAB — COMPREHENSIVE METABOLIC PANEL
ALT: 39 U/L (ref 0–53)
AST: 40 U/L — ABNORMAL HIGH (ref 0–37)
Albumin: 4.6 g/dL (ref 3.5–5.2)
Alkaline Phosphatase: 45 U/L (ref 39–117)
BUN: 12 mg/dL (ref 6–23)
CO2: 28 mEq/L (ref 19–32)
Calcium: 9.5 mg/dL (ref 8.4–10.5)
Chloride: 106 mEq/L (ref 96–112)
Creatinine, Ser: 1.13 mg/dL (ref 0.40–1.50)
GFR: 77.88 mL/min (ref >60.00)
Glucose, Bld: 96 mg/dL (ref 70–99)
Potassium: 4.1 mEq/L (ref 3.5–5.1)
Sodium: 141 mEq/L (ref 135–145)
Total Bilirubin: 0.8 mg/dL (ref 0.2–1.2)
Total Protein: 7.2 g/dL (ref 6.0–8.3)

## 2020-08-17 LAB — HEMOGLOBIN A1C: Hgb A1c MFr Bld: 6.1 % (ref 4.6–6.5)

## 2020-08-17 LAB — CBC
HCT: 46.1 % (ref 39.0–52.0)
Hemoglobin: 15.2 g/dL (ref 13.0–17.0)
MCHC: 32.9 g/dL (ref 30.0–36.0)
MCV: 84.2 fl (ref 78.0–100.0)
Platelets: 256 10*3/uL (ref 150.0–400.0)
RBC: 5.48 Mil/uL (ref 4.22–5.81)
RDW: 13.8 % (ref 11.5–15.5)
WBC: 3.6 10*3/uL — ABNORMAL LOW (ref 4.0–10.5)

## 2020-08-17 NOTE — Assessment & Plan Note (Signed)
Checking HgA1c and adjust as needed.  

## 2020-08-17 NOTE — Progress Notes (Signed)
   Subjective:   Patient ID: Grant Miller, male    DOB: [birth year], 45 y.o.   MRN: [id number]  HPI The patient is a 45 YO man coming in for concerns about left neck pain (long term, seeing orthopedics and getting medications from them, feel that they are not working well, wishes to get x-ray and PT) and left shoulder pain (not sure if this is all coming from the neck or also from the shoulder, would like to get assessed), and pre-diabetes (no labs since last year, strong family history of diabetes, denies excessive thirst or urination).   Review of Systems  Constitutional: Negative.   HENT: Negative.    Eyes: Negative.   Respiratory:  Negative for cough, chest tightness and shortness of breath.   Cardiovascular:  Negative for chest pain, palpitations and leg swelling.  Gastrointestinal:  Positive for abdominal pain. Negative for abdominal distention, constipation, diarrhea, nausea and vomiting.  Musculoskeletal:  Positive for arthralgias, back pain, myalgias, neck pain and neck stiffness.  Skin: Negative.   Neurological: Negative.   Psychiatric/Behavioral: Negative.     Objective:  Physical Exam Constitutional:      Appearance: He is well-developed.  HENT:     Head: Normocephalic and atraumatic.  Cardiovascular:     Rate and Rhythm: Normal rate and regular rhythm.  Pulmonary:     Effort: Pulmonary effort is normal. No respiratory distress.     Breath sounds: Normal breath sounds. No wheezing or rales.  Abdominal:     General: Bowel sounds are normal. There is no distension.     Palpations: Abdomen is soft.     Tenderness: There is abdominal tenderness. There is no rebound.  Musculoskeletal:        General: Tenderness present.     Cervical back: Normal range of motion.  Skin:    General: Skin is warm and dry.  Neurological:     Mental Status: He is alert and oriented to person, place, and time.     Coordination: Coordination normal.    Vitals:   08/17/20 0926  BP: 130/80   Pulse: 80  Temp: 98.8 F (37.1 C)  TempSrc: Oral  SpO2: 98%  Weight: 219 lb 6.4 oz (99.5 kg)  Height: 6' (1.829 m)    This visit occurred during the SARS-CoV-2 public health emergency.  Safety protocols were in place, including screening questions prior to the visit, additional usage of staff PPE, and extensive cleaning of exam room while observing appropriate contact time as indicated for disinfecting solutions.   Assessment & Plan:

## 2020-08-17 NOTE — Assessment & Plan Note (Signed)
Ordered PT and left shoulder x-ray for assessment. He is unsure about his medications for pain and will call us back with that information.

## 2020-08-17 NOTE — Patient Instructions (Signed)
Let us know what medicines you are taking for pain currently and we can adjust this for you.

## 2020-08-17 NOTE — Assessment & Plan Note (Signed)
Checking x-ray neck and ordered PT. Work note given with restriction for lifting no more than 25 pounds.

## 2020-08-21 ENCOUNTER — Telehealth: Payer: Self-pay | Admitting: Internal Medicine

## 2020-08-21 NOTE — Telephone Encounter (Signed)
LVM for pt to rtn my call to schedule AWV with [facility]. Please schedule this appt if pt calls the office.  °

## 2020-08-23 DIAGNOSIS — E291 Testicular hypofunction: Secondary | ICD-10-CM | POA: Diagnosis not present

## 2020-08-28 ENCOUNTER — Telehealth: Payer: Self-pay | Admitting: Internal Medicine

## 2020-08-28 NOTE — Telephone Encounter (Signed)
Called and left a detailed voicemail with his lab results.

## 2020-08-28 NOTE — Telephone Encounter (Signed)
Patient was returning the call from [place] to go over lab results. Requesting a detailed VM be left on number listed. Informed him that the lab results had been mailed as well.   Please advise.   Best contact # [phone]

## 2020-08-30 ENCOUNTER — Other Ambulatory Visit: Payer: Self-pay

## 2020-08-30 ENCOUNTER — Encounter: Payer: Self-pay | Admitting: Physical Therapy

## 2020-08-30 ENCOUNTER — Ambulatory Visit: Payer: BC Managed Care – PPO | Attending: Internal Medicine | Admitting: Physical Therapy

## 2020-08-30 DIAGNOSIS — G8929 Other chronic pain: Secondary | ICD-10-CM | POA: Diagnosis not present

## 2020-08-30 DIAGNOSIS — M542 Cervicalgia: Secondary | ICD-10-CM

## 2020-08-30 DIAGNOSIS — E291 Testicular hypofunction: Secondary | ICD-10-CM | POA: Diagnosis not present

## 2020-08-30 DIAGNOSIS — M6281 Muscle weakness (generalized): Secondary | ICD-10-CM

## 2020-08-30 DIAGNOSIS — M25512 Pain in left shoulder: Secondary | ICD-10-CM | POA: Diagnosis not present
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
## 2020-08-30 NOTE — Patient Instructions (Signed)
Access Code: [email or web address] URL: [email or web address] Date: 08/30/2020 Prepared by: Shearon Balo  Exercises Seated Assisted Cervical Rotation with Towel - 3 x daily - 7 x weekly - 2 sets - 15 reps Supine Cervical Retraction with Towel - 2 x daily - 7 x weekly - 2 sets - 10 reps - 5 hold Seated Levator Scapulae Stretch - 1 x daily - 7 x weekly - 3 reps - 45 hold

## 2020-09-06 ENCOUNTER — Telehealth: Payer: Self-pay | Admitting: Internal Medicine

## 2020-09-06 DIAGNOSIS — E291 Testicular hypofunction: Secondary | ICD-10-CM | POA: Diagnosis not present

## 2020-09-06 NOTE — Telephone Encounter (Signed)
Okay to do letter I can sign.

## 2020-09-06 NOTE — Telephone Encounter (Signed)
See below

## 2020-09-06 NOTE — Telephone Encounter (Signed)
   Patient called and is requesting a letter that states that he is on a permanent 25 pound lift restriction for his work. His last OV was 08-17-20. He can be reached at [phone]. Please advise

## 2020-09-07 ENCOUNTER — Encounter: Payer: Self-pay | Admitting: Internal Medicine

## 2020-09-07 DIAGNOSIS — M25531 Pain in right wrist: Secondary | ICD-10-CM | POA: Diagnosis not present

## 2020-09-07 DIAGNOSIS — M4722 Other spondylosis with radiculopathy, cervical region: Secondary | ICD-10-CM | POA: Diagnosis not present

## 2020-09-07 DIAGNOSIS — M722 Plantar fascial fibromatosis: Secondary | ICD-10-CM | POA: Diagnosis not present

## 2020-09-07 NOTE — Telephone Encounter (Signed)
Letter has been signed. Patient has picked up the letter. No other questions or concerns at this time.

## 2020-09-12 ENCOUNTER — Ambulatory Visit: Payer: BC Managed Care – PPO | Attending: Internal Medicine | Admitting: Physical Therapy

## 2020-09-12 ENCOUNTER — Encounter: Payer: Self-pay | Admitting: Physical Therapy

## 2020-09-12 ENCOUNTER — Other Ambulatory Visit: Payer: Self-pay

## 2020-09-12 DIAGNOSIS — M6281 Muscle weakness (generalized): Secondary | ICD-10-CM | POA: Insufficient documentation

## 2020-09-12 DIAGNOSIS — G8929 Other chronic pain: Secondary | ICD-10-CM | POA: Diagnosis not present

## 2020-09-12 DIAGNOSIS — M542 Cervicalgia: Secondary | ICD-10-CM | POA: Insufficient documentation

## 2020-09-12 DIAGNOSIS — M25512 Pain in left shoulder: Secondary | ICD-10-CM | POA: Diagnosis not present
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
## 2020-09-14 DIAGNOSIS — E291 Testicular hypofunction: Secondary | ICD-10-CM | POA: Diagnosis not present

## 2020-09-15 ENCOUNTER — Ambulatory Visit: Payer: BC Managed Care – PPO

## 2020-09-18 DIAGNOSIS — R200 Anesthesia of skin: Secondary | ICD-10-CM | POA: Diagnosis not present

## 2020-09-18 DIAGNOSIS — M791 Myalgia, unspecified site: Secondary | ICD-10-CM | POA: Diagnosis not present

## 2020-09-19 ENCOUNTER — Ambulatory Visit: Payer: BC Managed Care – PPO | Admitting: Physical Therapy

## 2020-09-22 ENCOUNTER — Ambulatory Visit: Payer: BC Managed Care – PPO | Admitting: Physical Therapy

## 2020-09-22 ENCOUNTER — Other Ambulatory Visit: Payer: Self-pay

## 2020-09-22 DIAGNOSIS — M6281 Muscle weakness (generalized): Secondary | ICD-10-CM

## 2020-09-22 DIAGNOSIS — M542 Cervicalgia: Secondary | ICD-10-CM

## 2020-09-22 DIAGNOSIS — M25512 Pain in left shoulder: Secondary | ICD-10-CM | POA: Diagnosis not present

## 2020-09-22 DIAGNOSIS — G8929 Other chronic pain: Secondary | ICD-10-CM

## 2020-09-22 NOTE — Therapy (Signed)
[place], [place], [address] Phone: [phone]   Fax:  [phone]  Physical Therapy Treatment  Patient Details  Name: Grant Miller MRN: [id number] Date of Birth: [birth year] Referring Provider (PT): Hoyt Koch, MD   Encounter Date: 09/22/2020   PT End of Session - 09/22/20 1401     Visit Number 3    Number of Visits 16    Date for PT Re-Evaluation 10/25/20    Authorization Type MCR - FOTO 6 and 10    Progress Note Due on Visit 10    PT Start Time 1402    PT Stop Time 1441    PT Time Calculation (min) 39 min    Activity Tolerance Patient tolerated treatment well;Patient limited by pain    Behavior During Therapy [facility] for tasks assessed/performed             Past Medical History:  Diagnosis Date   Depression     Past Surgical History:  Procedure Laterality Date   ABDOMINAL SURGERY      There were no vitals filed for this visit.   Subjective Assessment - 09/22/20 1404     Subjective " I am still having alot of pain especially from where I got the injection on the L side. pain stays mostly around a 7-8/10, I haven't been doing my exercise at home really"    Diagnostic tests IMPRESSION:  Persistent acromioclavicular and coracoclavicular separation,  essentially stable. No fracture or dislocation. No appreciable joint  space narrowing.  IMPRESSION:  No multifocal osteoarthritic change, progressed overall compared to  the 2010 study. No fracture or spondylolisthesis.    Currently in Pain? Yes    Pain Score 8     Pain Location Neck    Pain Orientation Left                [facility] PT Assessment - 09/22/20 0001       Assessment   Medical Diagnosis Neck pain on left side (M54.2), Chronic left shoulder pain (M25.512, G89.29)    Referring Provider (PT) Hoyt Koch, MD                           [facility] Adult PT Treatment/Exercise - 09/22/20 0001        Neck Exercises: Seated   Neck Retraction 10 reps    Other Seated Exercise Scap retraction 3sec x10    Other Seated Exercise upper cervical rotation with hand on chest using 4 fingers for tactile cues.      Manual Therapy   Manual Therapy Manual Traction    Manual therapy comments MTPR along the L scalenes, upper trap, levator scapualae, sub-occipitals and cervical multifid.    Joint Mobilization Grade III CPA C3-C7, L lateral gapping C3-C7 grade III, L first rib inferior mobs grade IV    Soft tissue mobilization STM along sub-occipitals, upper trap/ levator scapuulae    Manual Traction cervical traction 2 x 2 min ea      Neck Exercises: Stretches   Upper Trapezius Stretch 2 reps;Left;30 seconds    Levator Stretch 2 reps;Left;30 seconds                      PT Short Term Goals - 08/30/20 1307       PT SHORT TERM GOAL #1   Title [name] will be >75% HEP compliant within 3 weeks to improve carryover  between sessions and facilitate independent management of condition.    Target Date 09/20/20               PT Long Term Goals - 08/30/20 1307       PT LONG TERM GOAL #1   Title [name] will report a 50% decrease in pain from evaluation  EVAL: 9-10/10 max pain    Target Date 10/25/20      PT LONG TERM GOAL #2   Title [name] will show significant improvement cervical ROM in L rotation, L side bend, and ext    Target Date 10/25/20      PT LONG TERM GOAL #3   Title Elix Catala will be able to return to work, not limited by pain  EVAL: unable    Target Date 10/25/20      PT LONG TERM GOAL #4   Title Zadkiel Rasmussen will improve FOTO score from 46 to 57 as a proxy for functional improvement    Target Date 10/25/20                   Plan - 09/22/20 1443     Clinical Impression Statement patient arrives to session noting he had an injection in the L side of his neck on Monday and notes continued pain consistently rated at 7-8/10.  pt requested to focus on his neck today due to his elevated pain level. STW and cervical mobs central and gapping to provide facet relief of the neck. discussed throughout session that too much overpressure with stretching and over doing exercises isn't necessarily better and can cause soreness. pt noted continued pain in the neck following session.    Stability/Clinical Decision Making Stable/Uncomplicated    PT Treatment/Interventions ADLs/Self Care Home Management;Cryotherapy;Electrical Stimulation;Moist Heat;Traction;Ultrasound;Iontophoresis '4mg'$ /ml Dexamethasone;Therapeutic activities;Therapeutic exercise;Balance training;Neuromuscular re-education;Manual techniques;Patient/family education;Passive range of motion;Taping    PT Next Visit Plan manual for neck and periscapular region, shoulder strengthening in available range, trial AC mobilization, TDN, neural mobility    PT Home Exercise Plan [email or web address]    Consulted and Agree with Plan of Care Patient             Patient will benefit from skilled therapeutic intervention in order to improve the following deficits and impairments:  Improper body mechanics, Decreased strength, Increased muscle spasms, Postural dysfunction, Pain, Decreased endurance, Decreased activity tolerance, Decreased range of motion  Visit Diagnosis: Chronic left shoulder pain - Plan: PT plan of care cert/re-cert  Cervicalgia - Plan: PT plan of care cert/re-cert  Muscle weakness - Plan: PT plan of care cert/re-cert     Problem List Patient Active Problem List   Diagnosis Date Noted   Neck pain on left side 08/17/2020   Bilateral carpal tunnel syndrome 12/08/2019   Lumbar disc disease 12/08/2019   Arthritis of lumbar spine 12/08/2019   Attention and concentration deficit 04/30/2019   Chronic bilateral low back pain without sciatica 02/03/2018   Impaired fasting blood sugar 10/20/2017   Right foot pain 10/20/2017   Left shoulder pain 04/17/2017   Ganglion cyst  of wrist, right 08/09/2016   Snoring 01/20/2016   ED (erectile dysfunction) 01/20/2016   Routine general medical examination at a health care facility 01/20/2016   Hx of abdominal surgery 09/28/2014   Leukopenia 02/04/2014   Obesity 06/15/2007   SARCOIDOSIS 06/11/2007   Starr [place] PT, DPT, LAT, ATC  09/22/20  2:55 PM      [place]  [place], [place], [address] Phone: [phone]   Fax:  [phone]  Name: Grant Miller MRN: [id number] Date of Birth: 01-May-1975

## 2020-09-26 ENCOUNTER — Ambulatory Visit: Payer: BC Managed Care – PPO

## 2020-09-26 ENCOUNTER — Inpatient Hospital Stay: Payer: Medicare Other | Admitting: Oncology

## 2020-09-26 ENCOUNTER — Inpatient Hospital Stay: Payer: Medicare Other

## 2020-09-27 DIAGNOSIS — M542 Cervicalgia: Secondary | ICD-10-CM | POA: Diagnosis not present

## 2020-09-27 DIAGNOSIS — G5601 Carpal tunnel syndrome, right upper limb: Secondary | ICD-10-CM | POA: Diagnosis not present

## 2020-09-29 ENCOUNTER — Encounter: Payer: Self-pay | Admitting: Physical Therapy

## 2020-09-29 ENCOUNTER — Other Ambulatory Visit: Payer: Self-pay

## 2020-09-29 ENCOUNTER — Ambulatory Visit: Payer: BC Managed Care – PPO | Admitting: Physical Therapy

## 2020-09-29 DIAGNOSIS — G8929 Other chronic pain: Secondary | ICD-10-CM

## 2020-09-29 DIAGNOSIS — M542 Cervicalgia: Secondary | ICD-10-CM

## 2020-09-29 DIAGNOSIS — M25512 Pain in left shoulder: Secondary | ICD-10-CM

## 2020-09-29 DIAGNOSIS — M6281 Muscle weakness (generalized): Secondary | ICD-10-CM

## 2020-09-29 NOTE — Therapy (Signed)
[place], [place], [address] Phone: [phone]   Fax:  [phone]  Physical Therapy Treatment  Patient Details  Name: Grant Miller MRN: [id number] Date of Birth: [birth year] Referring Provider (PT): Hoyt Koch, MD   Encounter Date: 09/29/2020   PT End of Session - 09/29/20 1320     Visit Number 4    Number of Visits 16    Date for PT Re-Evaluation 10/25/20    Authorization Type MCR - FOTO 6 and 10    Progress Note Due on Visit 10    PT Start Time 1320    PT Stop Time 1400    PT Time Calculation (min) 40 min    Activity Tolerance Patient tolerated treatment well;Patient limited by pain             Past Medical History:  Diagnosis Date   Depression     Past Surgical History:  Procedure Laterality Date   ABDOMINAL SURGERY      There were no vitals filed for this visit.   Subjective Assessment - 09/29/20 1321     Subjective "I really think I am doing better. I have been doing the exercises and it has been helping neck and jaw. been using the therecane    Diagnostic tests IMPRESSION:  Persistent acromioclavicular and coracoclavicular separation,  essentially stable. No fracture or dislocation. No appreciable joint  space narrowing.  IMPRESSION:  No multifocal osteoarthritic change, progressed overall compared to  the 2010 study. No fracture or spondylolisthesis.    Currently in Pain? Yes    Pain Score 6     Pain Location Neck    Pain Orientation Left    Pain Descriptors / Indicators Aching;Sore                [facility] PT Assessment - 09/29/20 0001       Assessment   Medical Diagnosis Neck pain on left side (M54.2), Chronic left shoulder pain (M25.512, G89.29)    Referring Provider (PT) Hoyt Koch, MD                           [facility] Adult PT Treatment/Exercise - 09/29/20 0001       Neck Exercises: Machines for Strengthening   UBE (Upper  Arm Bike) L1 x 58mn   fwd/bwd x 2 min     Neck Exercises: Seated   Other Seated Exercise SNAGs bil 2 x 10 holding end range x 3 sec      Modalities   Modalities Traction      Traction   Type of Traction Cervical    Min (lbs) 12    Max (lbs) 18    Hold Time 60    Rest Time 20    Time 10      Manual Therapy   Manual therapy comments MTPR along the L scalenes, upper trap, levator scapualae, sub-occipitals and cervical multifid.    Joint Mobilization Grade III CPA C3-C7, L lateral gapping C3-C7 grade III, L first rib inferior mobs grade IV    Soft tissue mobilization STM along sub-occipitals, upper trap/ levator scapuulae      Neck Exercises: Stretches   Upper Trapezius Stretch Left;2 reps;30 seconds    Levator Stretch Left;2 reps;30 seconds                      PT Short Term Goals - 08/30/20 1307  PT SHORT TERM GOAL #1   Title [name] will be >75% HEP compliant within 3 weeks to improve carryover between sessions and facilitate independent management of condition.    Target Date 09/20/20               PT Long Term Goals - 08/30/20 1307       PT LONG TERM GOAL #1   Title [name] will report a 50% decrease in pain from evaluation  EVAL: 9-10/10 max pain    Target Date 10/25/20      PT LONG TERM GOAL #2   Title [name] will show significant improvement cervical ROM in L rotation, L side bend, and ext    Target Date 10/25/20      PT LONG TERM GOAL #3   Title Eula Hoey will be able to return to work, not limited by pain  EVAL: unable    Target Date 10/25/20      PT LONG TERM GOAL #4   Title Jeydan Dorminy will improve FOTO score from 46 to 57 as a proxy for functional improvement    Target Date 10/25/20                   Plan - 09/29/20 1348     Clinical Impression Statement pt arrived to session reporting like he is doing better following last session and has been consitent with his HEP. Continued working on  cervical mobility utilzing more self techinques to help with HEP to maximize cervical mobility, and MTPR focusing on the L upper trap/ levator scapulae. trialed mechanical traction which he noted some relief of tension in the neck/ back.    PT Treatment/Interventions ADLs/Self Care Home Management;Cryotherapy;Electrical Stimulation;Moist Heat;Traction;Ultrasound;Iontophoresis '4mg'$ /ml Dexamethasone;Therapeutic activities;Therapeutic exercise;Balance training;Neuromuscular re-education;Manual techniques;Patient/family education;Passive range of motion;Taping    PT Next Visit Plan manual for neck and periscapular region, shoulder strengthening in available range, trial [facility] mobilization, discussed TDN, neural mobility, response to mechanical traction.    PT Home Exercise Plan [email or web address]    Consulted and Agree with Plan of Care Patient             Patient will benefit from skilled therapeutic intervention in order to improve the following deficits and impairments:  Improper body mechanics, Decreased strength, Increased muscle spasms, Postural dysfunction, Pain, Decreased endurance, Decreased activity tolerance, Decreased range of motion  Visit Diagnosis: Chronic left shoulder pain  Cervicalgia  Muscle weakness     Problem List Patient Active Problem List   Diagnosis Date Noted   Neck pain on left side 08/17/2020   Bilateral carpal tunnel syndrome 12/08/2019   Lumbar disc disease 12/08/2019   Arthritis of lumbar spine 12/08/2019   Attention and concentration deficit 04/30/2019   Chronic bilateral low back pain without sciatica 02/03/2018   Impaired fasting blood sugar 10/20/2017   Right foot pain 10/20/2017   Left shoulder pain 04/17/2017   Ganglion cyst of wrist, right 08/09/2016   Snoring 01/20/2016   ED (erectile dysfunction) 01/20/2016   Routine general medical examination at a health care facility 01/20/2016   Hx of abdominal surgery 09/28/2014   Leukopenia 02/04/2014   Obesity  06/15/2007   SARCOIDOSIS 06/11/2007   Starr Lake PT, DPT, LAT, ATC  09/29/20  2:04 PM      [facility] [address] [place], [place], [address] Phone: [phone]   Fax:  [phone]  Name: Jewelz Isip MRN: [id number] Date of Birth: 1976-01-21

## 2020-10-03 ENCOUNTER — Ambulatory Visit: Payer: BC Managed Care – PPO

## 2020-10-03 DIAGNOSIS — M542 Cervicalgia: Secondary | ICD-10-CM | POA: Diagnosis not present

## 2020-10-04 DIAGNOSIS — R5382 Chronic fatigue, unspecified: Secondary | ICD-10-CM | POA: Diagnosis not present

## 2020-10-04 DIAGNOSIS — M2559 Pain in other specified joint: Secondary | ICD-10-CM | POA: Diagnosis not present

## 2020-10-04 DIAGNOSIS — Z6832 Body mass index (BMI) 32.0-32.9, adult: Secondary | ICD-10-CM | POA: Diagnosis not present

## 2020-10-04 DIAGNOSIS — E291 Testicular hypofunction: Secondary | ICD-10-CM | POA: Diagnosis not present

## 2020-10-06 ENCOUNTER — Ambulatory Visit: Payer: BC Managed Care – PPO | Admitting: Physical Therapy

## 2020-10-06 DIAGNOSIS — M5412 Radiculopathy, cervical region: Secondary | ICD-10-CM | POA: Diagnosis not present

## 2020-10-13 ENCOUNTER — Encounter: Payer: Self-pay | Admitting: Physical Therapy

## 2020-10-13 ENCOUNTER — Other Ambulatory Visit: Payer: Self-pay

## 2020-10-13 ENCOUNTER — Ambulatory Visit: Payer: BC Managed Care – PPO | Attending: Internal Medicine | Admitting: Physical Therapy

## 2020-10-13 DIAGNOSIS — M6281 Muscle weakness (generalized): Secondary | ICD-10-CM | POA: Diagnosis not present

## 2020-10-13 DIAGNOSIS — G8929 Other chronic pain: Secondary | ICD-10-CM | POA: Insufficient documentation

## 2020-10-13 DIAGNOSIS — M25512 Pain in left shoulder: Secondary | ICD-10-CM | POA: Diagnosis not present

## 2020-10-13 DIAGNOSIS — M542 Cervicalgia: Secondary | ICD-10-CM

## 2020-10-13 NOTE — Therapy (Signed)
[place], [place], [address] Phone: [phone]   Fax:  [phone]  Physical Therapy Treatment  Patient Details  Name: Grant Miller MRN: [id number] Date of Birth: November 17, 1975 Referring Provider (PT): Hoyt Koch, MD   Encounter Date: 10/13/2020   PT End of Session - 10/13/20 0936     Visit Number 5    Number of Visits 16    Date for PT Re-Evaluation 10/25/20    Authorization Type MCR - FOTO 6 and 10    Progress Note Due on Visit 10    PT Start Time 0932    PT Stop Time 1015    PT Time Calculation (min) 43 min    Activity Tolerance Patient tolerated treatment well;Patient limited by pain    Behavior During Therapy [facility] for tasks assessed/performed             Past Medical History:  Diagnosis Date   Depression     Past Surgical History:  Procedure Laterality Date   ABDOMINAL SURGERY      There were no vitals filed for this visit.   Subjective Assessment - 10/13/20 0936     Subjective "I am still having that shrt pain in the neck, I did get my imaging results and they said it is worse than we thought.    Currently in Pain? Yes    Pain Score 8     Pain Location Neck    Pain Orientation Left    Pain Descriptors / Indicators Aching    Pain Type Chronic pain    Pain Onset More than a month ago    Pain Frequency Intermittent    Aggravating Factors  getting out of bed    Pain Relieving Factors unsure                [facility] PT Assessment - 10/13/20 0001       Assessment   Medical Diagnosis Neck pain on left side (M54.2), Chronic left shoulder pain (M25.512, G89.29)    Referring Provider (PT) Hoyt Koch, MD                           [facility] Adult PT Treatment/Exercise - 10/13/20 0001       Traction   Type of Traction Cervical    Min (lbs) 12    Max (lbs) 22    Hold Time 60    Rest Time 20    Time 10      Manual Therapy   Manual therapy  comments skilled palpation and monitoring of pt during TPDN    Joint Mobilization Grade III CPA C3-C7, L lateral gapping C3-C7 grade III, L first rib inferior mobs grade IV    Soft tissue mobilization IASTM along the L cervical paraspinals              Trigger Point Dry Needling - 10/13/20 0001     Consent Given? Yes    Education Handout Provided Yes    Muscles Treated Head and Neck Cervical multifidi    Electrical Stimulation Performed with Dry Needling Yes    E-stim with Dry Needling Details CRPs at 20 adjusting intermittently to tolerance x 10 min    Cervical multifidi Response Twitch reponse elicited;Palpable increased muscle length   L at c3-C6                 PT Education - 10/13/20 1002  Education Details discussed with pt if no functional progress is made in the next few visits then we will have to discharge from PT and refer back to his MD    Person(s) Educated Patient    Methods Explanation    Comprehension Verbalized understanding              PT Short Term Goals - 08/30/20 1307       PT SHORT TERM GOAL #1   Title Grant Miller will be >75% HEP compliant within 3 weeks to improve carryover between sessions and facilitate independent management of condition.    Target Date 09/20/20               PT Long Term Goals - 08/30/20 1307       PT LONG TERM GOAL #1   Title Grant Miller will report a 50% decrease in pain from evaluation  EVAL: 9-10/10 max pain    Target Date 10/25/20      PT LONG TERM GOAL #2   Title Grant Miller will show significant improvement cervical ROM in L rotation, L side bend, and ext    Target Date 10/25/20      PT LONG TERM GOAL #3   Title Grant Miller will be able to return to work, not limited by pain  EVAL: unable    Target Date 10/25/20      PT LONG TERM GOAL #4   Title Grant Miller will improve FOTO score from 46 to 57 as a proxy for functional improvement    Target Date 10/25/20                    Plan - 10/13/20 0959     Clinical Impression Statement pt arrives reporting continued neck pain. He got imaging at his MD's office and reports the Neck is worse than what they thought, unable see report as it was done outside of [facility]. educated and consent was given for TPDN focusing on the L cervial mutlifidi combined with Estim followed with IASTM techniques and cervical mobs. continued cervical traction with increaesd pull/ time and he noted decreased tension end of session.    PT Treatment/Interventions ADLs/Self Care Home Management;Cryotherapy;Electrical Stimulation;Moist Heat;Traction;Ultrasound;Iontophoresis '4mg'$ /ml Dexamethasone;Therapeutic activities;Therapeutic exercise;Balance training;Neuromuscular re-education;Manual techniques;Patient/family education;Passive range of motion;Taping    PT Next Visit Plan manual for neck and periscapular region, shoulder strengthening in available range, trial [facility] mobilization, discussed TDN, neural mobility, response to mechanical traction.    PT Home Exercise Plan [email or web address]    Consulted and Agree with Plan of Care Patient             Patient will benefit from skilled therapeutic intervention in order to improve the following deficits and impairments:  Improper body mechanics, Decreased strength, Increased muscle spasms, Postural dysfunction, Pain, Decreased endurance, Decreased activity tolerance, Decreased range of motion  Visit Diagnosis: Chronic left shoulder pain  Cervicalgia  Muscle weakness     Problem List Patient Active Problem List   Diagnosis Date Noted   Neck pain on left side 08/17/2020   Bilateral carpal tunnel syndrome 12/08/2019   Lumbar disc disease 12/08/2019   Arthritis of lumbar spine 12/08/2019   Attention and concentration deficit 04/30/2019   Chronic bilateral low back pain without sciatica 02/03/2018   Impaired fasting blood sugar 10/20/2017   Right foot pain 10/20/2017   Left shoulder  pain 04/17/2017   Ganglion cyst of wrist, right 08/09/2016   Snoring 01/20/2016   ED (erectile  dysfunction) 01/20/2016   Routine general medical examination at a health care facility 01/20/2016   Hx of abdominal surgery 09/28/2014   Leukopenia 02/04/2014   Obesity 06/15/2007   SARCOIDOSIS 06/11/2007   Grant Miller PT, DPT, LAT, ATC  10/13/20  10:03 AM      [place], [place], [address] Phone: [phone]   Fax:  [phone]  Name: Grant Miller MRN: [id number] Date of Birth: 1976-02-05

## 2020-10-26 DIAGNOSIS — M5412 Radiculopathy, cervical region: Secondary | ICD-10-CM | POA: Diagnosis not present

## 2020-10-27 ENCOUNTER — Ambulatory Visit: Payer: BC Managed Care – PPO | Admitting: Physical Therapy

## 2020-10-31 ENCOUNTER — Other Ambulatory Visit: Payer: Self-pay

## 2020-10-31 ENCOUNTER — Ambulatory Visit: Payer: BC Managed Care – PPO | Admitting: Physical Therapy

## 2020-10-31 ENCOUNTER — Encounter: Payer: Self-pay | Admitting: Physical Therapy

## 2020-10-31 DIAGNOSIS — M6281 Muscle weakness (generalized): Secondary | ICD-10-CM

## 2020-10-31 DIAGNOSIS — G8929 Other chronic pain: Secondary | ICD-10-CM

## 2020-10-31 DIAGNOSIS — M25512 Pain in left shoulder: Secondary | ICD-10-CM | POA: Diagnosis not present

## 2020-10-31 DIAGNOSIS — M542 Cervicalgia: Secondary | ICD-10-CM

## 2020-10-31 NOTE — Therapy (Signed)
[place], [place], [address] Phone: [phone]   Fax:  [phone]  Physical Therapy Treatment / Re-certification  Patient Details  Name: Grant Miller MRN: [id number] Date of Birth: March 31, 1975 Referring Provider (PT): Hoyt Koch, MD   Encounter Date: 10/31/2020   PT End of Session - 10/31/20 0932     Visit Number 6    Number of Visits 16    Date for PT Re-Evaluation 12/12/20    Authorization Type MCR - FOTO  10    Progress Note Due on Visit 10    PT Start Time 0931    PT Stop Time 1018    PT Time Calculation (min) 47 min    Activity Tolerance Patient tolerated treatment well;Patient limited by pain    Behavior During Therapy [facility] for tasks assessed/performed             Past Medical History:  Diagnosis Date   Depression     Past Surgical History:  Procedure Laterality Date   ABDOMINAL SURGERY      There were no vitals filed for this visit.   Subjective Assessment - 10/31/20 0934     Subjective "I was having some stomach since the dry needling. I did get the shot in the neck on Thurs 8/25 and pain is getting better at a 5-6/10. I feel like I am doing better and that traction made a difference."    Diagnostic tests IMPRESSION:  Persistent acromioclavicular and coracoclavicular separation,  essentially stable. No fracture or dislocation. No appreciable joint  space narrowing.  IMPRESSION:  No multifocal osteoarthritic change, progressed overall compared to  the 2010 study. No fracture or spondylolisthesis.    Currently in Pain? Yes    Pain Score 5     Pain Location Neck    Pain Orientation Left    Pain Descriptors / Indicators Aching    Pain Type Chronic pain    Pain Onset More than a month ago    Pain Frequency Intermittent    Aggravating Factors  laying on the L side.    Pain Relieving Factors medication                [facility] PT Assessment - 10/31/20 0001        Assessment   Medical Diagnosis Neck pain on left side (M54.2), Chronic left shoulder pain (M25.512, G89.29)    Referring Provider (PT) Hoyt Koch, MD      Observation/Other Assessments   Focus on Therapeutic Outcomes (FOTO)  46%      AROM   Cervical Flexion 22    Cervical Extension 18    Cervical - Right Side Bend 15    Cervical - Left Side Bend 10    Cervical - Right Rotation 46    Cervical - Left Rotation 35                           [facility] Adult PT Treatment/Exercise - 10/31/20 0001       Neck Exercises: Theraband   Rows 10 reps   with GTB   Other Theraband Exercises scapular retraction with double ER with GTB      Neck Exercises: Supine   Neck Retraction 10 reps;10 secs      Neck Exercises: Stretches   Upper Trapezius Stretch 1 rep;30 seconds;Left    Levator Stretch Left;1 rep;30 seconds      Traction   Type  of Traction Cervical    Min (lbs) 15    Max (lbs) 25    Hold Time 60    Rest Time 20    Time 10      Manual Therapy   Manual therapy comments skilled palpation and monitoring of pt during TPDN    Joint Mobilization Grade III CPA C3-C7, L lateral gapping C3-C7 grade III, L first rib inferior mobs grade IV    Soft tissue mobilization IASTM along the L cervical paraspinals              Trigger Point Dry Needling - 10/31/20 0001     Consent Given? Yes    Education Handout Provided Yes    Muscles Treated Head and Neck Upper trapezius    Upper Trapezius Response Twitch reponse elicited;Palpable increased muscle length   L  only                 PT Education - 10/31/20 1001     Education Details reviewed HEP and updated today. disucssed POC and if no funcitonal progress is noted in the next few visits then plan to discharge back to his MD. Directed him to ask his MD or pharmacist if he has any questions regarding his medicaiton / side effects.    Person(s) Educated Patient    Methods Explanation;Verbal cues;Handout     Comprehension Verbalized understanding;Verbal cues required              PT Short Term Goals - 10/31/20 0939       PT SHORT TERM GOAL #2   Title increase lumbar motion to having 50% with minimal pain     Period Weeks               PT Long Term Goals - 10/31/20 0939       PT LONG TERM GOAL #1   Title [name] will report a 50% decrease in pain from evaluation  EVAL: 9-10/10 max pain    Baseline today reports pain 5/10 but notes he is on prednisone / injection recently    Period Weeks    Status Partially Met      PT LONG TERM GOAL #2   Title [name] will show significant improvement cervical ROM in L rotation, L side bend, and ext    Period Weeks    Status On-going      PT LONG TERM GOAL #3   Title [name] will be able to return to work, not limited by pain  EVAL: unable    Period Weeks    Status On-going      PT LONG TERM GOAL #4   Title [name] will improve FOTO score from 46 to 57 as a proxy for functional improvement    Status Not Met                   Plan - 10/31/20 0956     Clinical Impression Statement [name] arrives to PT since his last visit 2 weeks ago due to cancellations secondary to getting an injection on 8/25. He does report a reduction in pain today at 5/10. He demonstrates limited improvement regarding his LTG assessment. continued TPDN focusing on the L upper trap followed with IASTM techiques. began posterior shoulder strengthening which he fatigued quickly with and require cues for proper posture. continued traction increasing max pull. He would benefit from continued physical therapy to decrease neck pain, improve cervical mobility and increase gross  shoudler strength. discussed if no funcitonal/ measure progress is made by the end of the upcoming visits then may need to discharge from PT and refer back to his MD which he verbalized understanding.    PT Frequency 1x / week    PT Duration 3 weeks    PT  Treatment/Interventions ADLs/Self Care Home Management;Cryotherapy;Electrical Stimulation;Moist Heat;Traction;Ultrasound;Iontophoresis 11m/ml Dexamethasone;Therapeutic activities;Therapeutic exercise;Balance training;Neuromuscular re-education;Manual techniques;Patient/family education;Passive range of motion;Taping    PT Next Visit Plan manual for neck and periscapular region, shoulder strengthening in available range, trial [facility] Care[facility]ilization, discussed TDN, neural mobility, response to mechanical traction.    PT Home Exercise Plan [email or web address]    Consulted and Agree with Plan of Care Patient             Patient will benefit from skilled therapeutic intervention in order to improve the following deficits and impairments:  Improper body mechanics, Decreased strength, Increased muscle spasms, Postural dysfunction, Pain, Decreased endurance, Decreased activity tolerance, Decreased range of motion  Visit Diagnosis: Chronic left shoulder pain - Plan: PT plan of care cert/re-cert  Cervicalgia - Plan: PT plan of care cert/re-cert  Muscle weakness - Plan: PT plan of care cert/re-cert     Problem List Patient Active Problem List   Diagnosis Date Noted   Neck pain on left side 08/17/2020   Bilateral carpal tunnel syndrome 12/08/2019   Lumbar disc disease 12/08/2019   Arthritis of lumbar spine 12/08/2019   Attention and concentration deficit 04/30/2019   Chronic bilateral low back pain without sciatica 02/03/2018   Impaired fasting blood sugar 10/20/2017   Right foot pain 10/20/2017   Left shoulder pain 04/17/2017   Ganglion cyst of wrist, right 08/09/2016   Snoring 01/20/2016   ED (erectile dysfunction) 01/20/2016   Routine general medical examination at a health care facility 01/20/2016   Hx of abdominal surgery 09/28/2014   Leukopenia 02/04/2014   Obesity 06/15/2007   SARCOIDOSIS 06/11/2007  Grant LakePT, DPT, LAT, ATC  10/31/20  10:14 AM      [facility] [place] [address]Phone: [phone]  Fax:  [phone] Name: KKruze AtchleyMRN: [id number] of Birth: [birth year]

## 2020-11-10 ENCOUNTER — Ambulatory Visit: Payer: BC Managed Care – PPO | Admitting: Physical Therapy

## 2020-11-15 ENCOUNTER — Ambulatory Visit: Payer: BC Managed Care – PPO | Attending: Internal Medicine | Admitting: Physical Therapy

## 2020-11-15 ENCOUNTER — Other Ambulatory Visit: Payer: Self-pay

## 2020-11-15 ENCOUNTER — Encounter: Payer: Self-pay | Admitting: Physical Therapy

## 2020-11-15 DIAGNOSIS — G8929 Other chronic pain: Secondary | ICD-10-CM | POA: Insufficient documentation

## 2020-11-15 DIAGNOSIS — M6281 Muscle weakness (generalized): Secondary | ICD-10-CM | POA: Insufficient documentation

## 2020-11-15 DIAGNOSIS — M542 Cervicalgia: Secondary | ICD-10-CM | POA: Diagnosis not present

## 2020-11-15 DIAGNOSIS — M25512 Pain in left shoulder: Secondary | ICD-10-CM | POA: Insufficient documentation

## 2020-11-15 NOTE — Therapy (Addendum)
[place], [place], [address] Phone: [phone]   Fax:  [phone]  Physical Therapy Treatment / Discharge  Patient Details  Name: Grant Miller MRN: [id number] Date of Birth: [birth year] Referring Provider (PT): Hoyt Koch, MD   Encounter Date: 11/15/2020   PT End of Session - 11/15/20 0937     Visit Number 7    Number of Visits 16    Date for PT Re-Evaluation 12/12/20    Authorization Type MCR - FOTO  10    PT Start Time 0931    PT Stop Time 1021    PT Time Calculation (min) 50 min    Activity Tolerance Patient tolerated treatment well;Patient limited by pain    Behavior During Therapy [facility] for tasks assessed/performed             Past Medical History:  Diagnosis Date   Depression     Past Surgical History:  Procedure Laterality Date   ABDOMINAL SURGERY      There were no vitals filed for this visit.   Subjective Assessment - 11/15/20 0938     Subjective "I am doing pretty good today pain is at 6/10 in the neck."    Diagnostic tests IMPRESSION:  Persistent acromioclavicular and coracoclavicular separation,  essentially stable. No fracture or dislocation. No appreciable joint  space narrowing.  IMPRESSION:  No multifocal osteoarthritic change, progressed overall compared to  the 2010 study. No fracture or spondylolisthesis.    Currently in Pain? Yes    Pain Score 6                 [facility] PT Assessment - 11/15/20 0001       Assessment   Medical Diagnosis Neck pain on left side (M54.2), Chronic left shoulder pain (M25.512, G89.29)    Referring Provider (PT) Hoyt Koch, MD                  [facility] Adult PT Treatment/Exercise:  Therapeutic Exercise: Supine chin tuck 1 x 10 with 5 sec Supine double ER with scapular retraction 2 x 10 with RTB Pt took multiple rest breaks between ea rep Bil scapular protraction 2 x 10  Tactile cues for proper  form Seated scapular retraction 1 x 10 holding 5 sec. Upper trap stretch 2 x 30 L only Cues to stay within pain free range, more isn't better.   Manual Therapy:  Skilled palpation and monitoring of pt during TPDN IASTM along L upper trap  Neuromuscular re-ed: N/A  Therapeutic Activity: N/A  Self Care Avoiding excessive shoulder hiking and focus on stretching   ITEMS NOT PERFORMED TODAY: Rows          [facility] Adult PT Treatment/Exercise - 11/15/20 0001       Traction   Type of Traction Cervical    Min (lbs) 18    Max (lbs) 25    Hold Time 60    Rest Time 20    Time 10              Trigger Point Dry Needling - 11/15/20 0001     Consent Given? Yes    Education Handout Provided Yes    E-stim with Dry Needling Details CRPs at 20 adjusting intermittently to tolerance x 8 min    Upper Trapezius Response Twitch reponse elicited;Palpable increased muscle length   L only  PT Short Term Goals - 10/31/20 0939       PT SHORT TERM GOAL #2   Title increase lumbar motion to having 50% with minimal pain     Period Weeks               PT Long Term Goals - 10/31/20 0939       PT LONG TERM GOAL #1   Title Grant Miller will report a 50% decrease in pain from evaluation  EVAL: 9-10/10 max pain    Baseline today reports pain 5/10 but notes he is on prednisone / injection recently    Period Weeks    Status Partially Met      PT LONG TERM GOAL #2   Title Grant Miller will show significant improvement cervical ROM in L rotation, L side bend, and ext    Period Weeks    Status On-going      PT LONG TERM GOAL #3   Title Grant Miller will be able to return to work, not limited by pain  EVAL: unable    Period Weeks    Status On-going      PT LONG TERM GOAL #4   Title Grant Miller will improve FOTO score from 46 to 57 as a proxy for functional improvement    Status Not Met                   Plan - 11/15/20 1006      Clinical Impression Statement pt arrives to session reporting improvement but notes pain stays constantly at a 6/10. continued TPDN for the L upper trap combined with Estim followed with IASTM techniques. worked on [employer] and posterior shoulder strengthening which he completed but took increased time between ea rep. reviewed posture and avoiding hiking the shoulder and replace with stretching but to stay within his pain free ranges. finished with cervical traction.    PT Treatment/Interventions ADLs/Self Care Home Management;Cryotherapy;Electrical Stimulation;Moist Heat;Traction;Ultrasound;Iontophoresis 36m/ml Dexamethasone;Therapeutic activities;Therapeutic exercise;Balance training;Neuromuscular re-education;Manual techniques;Patient/family education;Passive range of motion;Taping    PT Next Visit Plan manual for neck and periscapular region, shoulder strengthening in available range, trial [facility], discussed TDN, neural mobility, response to mechanical traction.    Consulted and Agree with Plan of Care Patient             Patient will benefit from skilled therapeutic intervention in order to improve the following deficits and impairments:  Improper body mechanics, Decreased strength, Increased muscle spasms, Postural dysfunction, Pain, Decreased endurance, Decreased activity tolerance, Decreased range of motion  Visit Diagnosis: Chronic left shoulder pain  Cervicalgia  Muscle weakness     Problem List Patient Active Problem List   Diagnosis Date Noted   Neck pain on left side 08/17/2020   Bilateral carpal tunnel syndrome 12/08/2019   Lumbar disc disease 12/08/2019   Arthritis of lumbar spine 12/08/2019   Attention and concentration deficit 04/30/2019   Chronic bilateral low back pain without sciatica 02/03/2018   Impaired fasting blood sugar 10/20/2017   Right foot pain 10/20/2017   Left shoulder pain 04/17/2017   Ganglion cyst of wrist, right 08/09/2016   Snoring  01/20/2016   ED (erectile dysfunction) 01/20/2016   Routine general medical examination at a health care facility 01/20/2016   Hx of abdominal surgery 09/28/2014   Leukopenia 02/04/2014   Obesity 06/15/2007   SARCOIDOSIS 06/11/2007    KStarr LakePT, DPT, LAT, ATC  11/15/20  10:11 AM      [facility] [address] [place], [place], [address] Phone: [phone]   Fax:  [phone]  Name: Grant Miller MRN: [id number] Date of Birth: 09/28/1975       PHYSICAL THERAPY DISCHARGE SUMMARY  Visits from Start of Care: 7  Current functional level related to goals / functional outcomes: See goals   Remaining deficits: Current status unknown   Education / Equipment: HEP, posture, theraband   Patient agrees to discharge. Patient goals were not met. Patient is being discharged due to not returning since the last visit.  Ranyah Groeneveld PT, DPT, LAT, ATC  12/24/20  8:08 PM

## 2020-11-17 ENCOUNTER — Other Ambulatory Visit: Payer: Self-pay

## 2020-11-17 ENCOUNTER — Inpatient Hospital Stay (HOSPITAL_BASED_OUTPATIENT_CLINIC_OR_DEPARTMENT_OTHER): Payer: BC Managed Care – PPO | Admitting: Oncology

## 2020-11-17 ENCOUNTER — Inpatient Hospital Stay: Payer: BC Managed Care – PPO | Attending: Oncology

## 2020-11-17 VITALS — BP 136/82 | HR 58 | Temp 98.9°F | Resp 18 | Ht 72.0 in | Wt 245.0 lb

## 2020-11-17 DIAGNOSIS — D709 Neutropenia, unspecified: Secondary | ICD-10-CM | POA: Insufficient documentation

## 2020-11-17 LAB — CBC WITH DIFFERENTIAL (CANCER CENTER ONLY)
Abs Immature Granulocytes: 0.01 10*3/uL (ref 0.00–0.07)
Basophils Absolute: 0 10*3/uL (ref 0.0–0.1)
Basophils Relative: 1 %
Eosinophils Absolute: 0.1 10*3/uL (ref 0.0–0.5)
Eosinophils Relative: 2 %
HCT: 46.1 % (ref 39.0–52.0)
Hemoglobin: 15.2 g/dL (ref 13.0–17.0)
Immature Granulocytes: 0 %
Lymphocytes Relative: 42 %
Lymphs Abs: 1.8 10*3/uL (ref 0.7–4.0)
MCH: 27.3 pg (ref 26.0–34.0)
MCHC: 33 g/dL (ref 30.0–36.0)
MCV: 82.9 fL (ref 80.0–100.0)
Monocytes Absolute: 0.4 10*3/uL (ref 0.1–1.0)
Monocytes Relative: 9 %
Neutro Abs: 2 10*3/uL (ref 1.7–7.7)
Neutrophils Relative %: 46 %
Platelet Count: 240 10*3/uL (ref 150–400)
RBC: 5.56 MIL/uL (ref 4.22–5.81)
RDW: 13.6 % (ref 11.5–15.5)
WBC Count: 4.3 10*3/uL (ref 4.0–10.5)
nRBC: 0 % (ref 0.0–0.2)

## 2020-11-17 NOTE — Progress Notes (Signed)
Hematology and Oncology Follow Up Visit  Grant Miller LU:[id number] Jun 20, 1975 45 y.o. 11/17/2020 7:56 AM   Principle Diagnosis: 45 year old man with fluctuating neutropenia related to his neck variation diagnosed in 2009.  He has not required any treatment or intervention.   Current therapy: Active surveillance.   Interim History: Grant Miller returns today for a follow-up visit.  Since her last visit, he has been receiving physical therapy due to injury to his shoulder.  He reports no fevers, chills or sweats.  Denies any appetite changes or changes in his performance status.  He is limited in his work related abilities because of neck and shoulder pain.     Medications: Reviewed without changes  Current Outpatient Medications  Medication Sig Dispense Refill   acetaminophen (TYLENOL) 500 MG tablet Take by mouth. (Patient not taking: Reported on 08/30/2020)     cyclobenzaprine (FLEXERIL) 10 MG tablet Take 1 tablet (10 mg total) by mouth 2 (two) times daily as needed for muscle spasms. 20 tablet 0   Diphenhydramine-APAP, sleep, (TYLENOL PM EXTRA STRENGTH PO) Take by mouth.     fluticasone (FLONASE) 50 MCG/ACT nasal spray PLACE 2 SPRAYS INTO BOTH NOSTRILS DAILY (Patient not taking: Reported on 08/30/2020) 16 g 3   meloxicam (MOBIC) 7.5 MG tablet Take 7.5 mg by mouth daily.     sildenafil (VIAGRA) 100 MG tablet Take 0.5-1 tablets (50-100 mg total) by mouth daily as needed for erectile dysfunction. 5 tablet 11   traMADol (ULTRAM) 50 MG tablet Take 1 tablet (50 mg total) by mouth every 6 (six) hours as needed. 30 tablet 0   No current facility-administered medications for this visit.    Allergies:  Allergies  Allergen Reactions   Iodine Hives and Shortness Of Breath   Other Hives and Shortness Of Breath    All seafood causes this reaction   Shellfish Allergy     Other reaction(s): SHORTNESS OF BREATH       Physical Exam:     Blood pressure 136/82, pulse (!) 58, temperature 98.9  F (37.2 C), temperature source Oral, resp. rate 18, height 6' (1.829 m), weight 245 lb (111.1 kg), SpO2 99 %.   ECOG: 0   General appearance: Alert, awake without any distress. Head: Atraumatic without abnormalities Oropharynx: Without any thrush or ulcers. Eyes: No scleral icterus. Lymph nodes: No lymphadenopathy noted in the cervical, supraclavicular, or axillary nodes Heart:regular rate and rhythm, without any murmurs or gallops.   Lung: Clear to auscultation without any rhonchi, wheezes or dullness to percussion. Abdomin: Soft, nontender without any shifting dullness or ascites. Musculoskeletal: No clubbing or cyanosis. Neurological: No motor or sensory deficits. Skin: No rashes or lesions.         Lab Results: Lab Results  Component Value Date   WBC 3.6 (L) 08/17/2020   HGB 15.2 08/17/2020   HCT 46.1 08/17/2020   MCV 84.2 08/17/2020   PLT 256.0 08/17/2020     Chemistry      Component Value Date/Time   NA 141 08/17/2020 0948   NA 142 08/16/2014 0759   K 4.1 08/17/2020 0948   K 3.8 08/16/2014 0759   CL 106 08/17/2020 0948   CO2 28 08/17/2020 0948   CO2 22 08/16/2014 0759   BUN 12 08/17/2020 0948   BUN 19.6 08/16/2014 0759   CREATININE 1.13 08/17/2020 0948   CREATININE 1.20 03/28/2020 1211   CREATININE 1.2 08/16/2014 0759      Component Value Date/Time   CALCIUM 9.5 08/17/2020 0948  CALCIUM 9.2 08/16/2014 0759   ALKPHOS 45 08/17/2020 0948   ALKPHOS 62 08/16/2014 0759   AST 40 (H) 08/17/2020 0948   AST 38 03/28/2020 1211   AST 35 (H) 08/16/2014 0759   ALT 39 08/17/2020 0948   ALT 43 03/28/2020 1211   ALT 36 08/16/2014 0759   BILITOT 0.8 08/17/2020 0948   BILITOT 0.6 03/28/2020 1211   BILITOT 0.43 08/16/2014 0759       Impression and Plan:   45 year old man with:   1.  Fluctuating neutropenia due to cyclic nature versus benign that I think variation without any evidence of a hematological disorder.   His disease status was reviewed and  laboratory testing in the last year was reiterated.  His white cell count has fluctuated close to normal range as low as 3.6.  He is absolute neutrophil count has been normal with a few exceptions intermittently.  CBC from June 2022 showed a white cell count of 3.6 and in January 2022 showed an absolute neutrophil count of 2100.  CBC from today was reviewed including his differential with an absolute neutrophil count of 2000 and no other hematological abnormalities.  The risk of opportunistic infection and the need for growth factor support were discussed at this time and I see no indication for it.  His risk of infection is very low.    2. Health maintenance issues.  This is up-to-date he continues to follow with his primary care physician.  3. Follow-up: Will be as needed in the future.  30 minutes were spent on this visit.  The time was dedicated to reviewing laboratory data, differential diagnosis, answering questions regarding future plan of care.    Zola Button, MD 9/16/20227:56 AM

## 2020-11-22 ENCOUNTER — Ambulatory Visit: Payer: BC Managed Care – PPO | Admitting: Physical Therapy

## 2020-11-23 ENCOUNTER — Ambulatory Visit: Payer: Medicare Other | Admitting: Oncology

## 2020-11-23 ENCOUNTER — Other Ambulatory Visit: Payer: Medicare Other

## 2020-11-28 ENCOUNTER — Emergency Department (HOSPITAL_COMMUNITY)
Admission: EM | Admit: 2020-11-28 | Discharge: 2020-11-28 | Disposition: A | Discharge status: Home / Self Care | Payer: BC Managed Care – PPO | Attending: Student | Admitting: Student

## 2020-11-28 ENCOUNTER — Encounter (HOSPITAL_COMMUNITY): Payer: Self-pay

## 2020-11-28 DIAGNOSIS — Z5321 Procedure and treatment not carried out due to patient leaving prior to being seen by health care provider: Secondary | ICD-10-CM | POA: Insufficient documentation

## 2020-11-28 DIAGNOSIS — R1031 Right lower quadrant pain: Secondary | ICD-10-CM | POA: Diagnosis not present

## 2020-11-28 NOTE — ED Triage Notes (Signed)
Pt reports right groin pain that radiates to right testicle that started Friday. Reports nagging pain all day yesterday.   Denies Urinary symptoms.   9/10 pain sharp in right groin.    A/ox4 Ambulatory in triage.

## 2020-11-28 NOTE — ED Provider Notes (Signed)
Emergency Medicine Provider Triage Evaluation Note  Daimon Kean , a 45 y.o. male  was evaluated in triage.  Pt complains of right groin pain for several days. Pain worse with walking.  Review of Systems  Positive: Groin pain Negative: fever  Physical Exam  BP (!) 150/88 (BP Location: Right Arm)   Pulse 80   Temp 98.6 F (37 C) (Oral)   Resp 16   Ht 5\' 11"  (1.803 m)   Wt 110.7 kg   SpO2 99%   BMI 34.03 kg/m  Gen:   Awake, no distress   Resp:  Normal effort  MSK:   Moves extremities without difficulty  Other:  Ttp to the right inguinal area, no hernia  Medical Decision Making  Medically screening exam initiated at 12:35 PM.  Appropriate orders placed.  Laren Whaling was informed that the remainder of the evaluation will be completed by another provider, this initial triage assessment does not replace that evaluation, and the importance of remaining in the ED until their evaluation is complete.     Bishop Dublin 11/28/20 1236    Teressa Lower, MD 11/28/20 1754

## 2020-11-29 ENCOUNTER — Emergency Department (HOSPITAL_COMMUNITY): Payer: BC Managed Care – PPO

## 2020-11-29 ENCOUNTER — Encounter (HOSPITAL_COMMUNITY): Payer: Self-pay

## 2020-11-29 ENCOUNTER — Emergency Department (HOSPITAL_COMMUNITY)
Admission: EM | Admit: 2020-11-29 | Discharge: 2020-11-29 | Disposition: A | Discharge status: Home / Self Care | Payer: BC Managed Care – PPO | Attending: Emergency Medicine | Admitting: Emergency Medicine

## 2020-11-29 DIAGNOSIS — R1031 Right lower quadrant pain: Secondary | ICD-10-CM | POA: Diagnosis present

## 2020-11-29 LAB — URINALYSIS, ROUTINE W REFLEX MICROSCOPIC
Bilirubin Urine: NEGATIVE
Glucose, UA: NEGATIVE mg/dL
Hgb urine dipstick: NEGATIVE
Ketones, ur: NEGATIVE mg/dL
Leukocytes,Ua: NEGATIVE
Nitrite: NEGATIVE
Protein, ur: NEGATIVE mg/dL
Specific Gravity, Urine: 1.025 (ref 1.005–1.030)
pH: 6 (ref 5.0–8.0)

## 2020-11-29 MED ORDER — KETOROLAC TROMETHAMINE 60 MG/2ML IM SOLN
60.0000 mg | Freq: Once | INTRAMUSCULAR | Status: AC
  Administered 2020-11-29: 60 mg via INTRAMUSCULAR
  Filled 2020-11-29: qty 2

## 2020-11-29 NOTE — ED Provider Notes (Signed)
[facility] DEPT Provider Note   CSN: [id number] Arrival date & time: 11/29/20  0540     History Chief Complaint  Patient presents with   Groin Pain    Grant Miller is a 45 y.o. male with medical history of depression, lumbar disc disease, and abdominal surgery.  Presents to the emergency department with a chief complaint of right groin pain.  Patient reports that his pain started on Monday while he was at work.  Patient states he recently returned to work after 3 months off and that he is currently on light duty.  States that he does a lot of walking for his job.  Pain has been constant since Monday.  Pain is worse with movement or touch.  Patient describes pain as "sharp."  Patient rates pain 8/10 on the pain scale.  Patient has had no relief with over-the-counter pain medication.  Patient denies any known trauma or injury.  Patient denies any fevers, chills, abdominal pain, nausea, vomiting, blood in stool, melena, dysuria, hematuria, urinary frequency, penile discharge, swelling or tenderness to genitals.   Groin Pain Pertinent negatives include no chest pain, no abdominal pain, no headaches and no shortness of breath.      Past Medical History:  Diagnosis Date   Depression     Patient Active Problem List   Diagnosis Date Noted   Neck pain on left side 08/17/2020   Bilateral carpal tunnel syndrome 12/08/2019   Lumbar disc disease 12/08/2019   Arthritis of lumbar spine 12/08/2019   Attention and concentration deficit 04/30/2019   Chronic bilateral low back pain without sciatica 02/03/2018   Impaired fasting blood sugar 10/20/2017   Right foot pain 10/20/2017   Left shoulder pain 04/17/2017   Ganglion cyst of wrist, right 08/09/2016   Snoring 01/20/2016   ED (erectile dysfunction) 01/20/2016   Routine general medical examination at a health care facility 01/20/2016   Hx of abdominal surgery 09/28/2014   Leukopenia 02/04/2014   Obesity  06/15/2007   SARCOIDOSIS 06/11/2007    Past Surgical History:  Procedure Laterality Date   ABDOMINAL SURGERY         Family History  Problem Relation Age of Onset   Heart disease Mother    Hyperlipidemia Mother    Kidney disease Mother    Diabetes Mother     Social History   Tobacco Use   Smoking status: Never   Smokeless tobacco: Never  Substance Use Topics   Alcohol use: No   Drug use: No    Home Medications Prior to Admission medications   Medication Sig Start Date End Date Taking? Authorizing Provider  acetaminophen (TYLENOL) 500 MG tablet Take by mouth. Patient not taking: Reported on 08/30/2020    [provider]  cyclobenzaprine (FLEXERIL) 10 MG tablet Take 1 tablet (10 mg total) by mouth 2 (two) times daily as needed for muscle spasms. 04/16/17   Providence Lanius A, PA-C  Diphenhydramine-APAP, sleep, (TYLENOL PM EXTRA STRENGTH PO) Take by mouth.    [provider]  fluticasone (FLONASE) 50 MCG/ACT nasal spray PLACE 2 SPRAYS INTO BOTH NOSTRILS DAILY Patient not taking: Reported on 08/30/2020 05/20/16   Einar Pheasant, MD  meloxicam (MOBIC) 7.5 MG tablet Take 7.5 mg by mouth daily. 11/18/19   [provider]  sildenafil (VIAGRA) 100 MG tablet Take 0.5-1 tablets (50-100 mg total) by mouth daily as needed for erectile dysfunction. 01/18/16   Hoyt Koch, MD  traMADol (ULTRAM) 50 MG tablet Take 1  tablet (50 mg total) by mouth every 6 (six) hours as needed. 12/08/19   Biagio Borg, MD    Allergies    Iodine, Other, and Shellfish allergy  Review of Systems   Review of Systems  Constitutional:  Negative for chills and fever.  Respiratory:  Negative for shortness of breath.   Cardiovascular:  Negative for chest pain.  Gastrointestinal:  Negative for abdominal distention, abdominal pain, blood in stool, constipation, diarrhea, nausea and vomiting.  Genitourinary:  Negative for difficulty urinating, dysuria, flank pain, frequency,  genital sores, hematuria, penile discharge, penile pain, penile swelling, scrotal swelling, testicular pain and urgency.  Musculoskeletal:  Positive for back pain (chronic). Negative for neck pain.  Skin:  Negative for color change and rash.  Neurological:  Negative for dizziness, syncope, light-headedness and headaches.  Psychiatric/Behavioral:  Negative for confusion.    Physical Exam Updated Vital Signs BP (!) 152/109 (BP Location: Left Arm)   Pulse 75   Temp 97.7 F (36.5 C) (Oral)   Resp 18   Ht 5' 11.5" (1.816 m)   Wt 110.7 kg   SpO2 95%   BMI 33.56 kg/m   Physical Exam Vitals and nursing note reviewed. Chaperone present: Male nurse tech present as Producer, television/film/video.  Constitutional:      General: He is not in acute distress.    Appearance: He is not ill-appearing, toxic-appearing or diaphoretic.  HENT:     Head: Normocephalic.  Eyes:     General: No scleral icterus.       Right eye: No discharge.        Left eye: No discharge.  Cardiovascular:     Rate and Rhythm: Normal rate.  Pulmonary:     Effort: Pulmonary effort is normal.  Abdominal:     General: There is no distension. There are no signs of injury.     Palpations: There is no mass or pulsatile mass.     Tenderness: There is no abdominal tenderness. There is no guarding or rebound.     Hernia: There is no hernia in the left inguinal area, right femoral area, left femoral area or right inguinal area.     Comments: Healed midline surgical scar.  Genitourinary:    Pubic Area: No rash or pubic lice.      Penis: Circumcised. No hypospadias, erythema, tenderness, discharge, swelling or lesions.      Testes: Normal. Cremasteric reflex is present.     Epididymis:     Right: Normal.     Left: Normal.     Tanner stage (genital): 5.     Comments: Patient has diffuse tenderness to right groin.  No hernia or mass appreciated. Lymphadenopathy:     Lower Body: No right inguinal adenopathy. No left inguinal adenopathy.   Skin:    General: Skin is warm and dry.  Neurological:     General: No focal deficit present.     Mental Status: He is alert and oriented to person, place, and time.     GCS: GCS eye subscore is 4. GCS verbal subscore is 5. GCS motor subscore is 6.     Comments: Patient able to stand and ambulate.  Psychiatric:        Behavior: Behavior is cooperative.    ED Results / Procedures / Treatments   Labs (all labs ordered are listed, but only abnormal results are displayed) Labs Reviewed  URINALYSIS, ROUTINE W REFLEX MICROSCOPIC    EKG None  Radiology No results found.  Procedures  Procedures   Medications Ordered in ED Medications  ketorolac (TORADOL) injection 60 mg (has no administration in time range)    ED Course  I have reviewed the triage vital signs and the nursing notes.  Pertinent labs & imaging results that were available during my care of the patient were reviewed by me and considered in my medical decision making (see chart for details).    MDM Rules/Calculators/A&P                           Alert 45 year old male no acute distress, nontoxic-appearing.  Presents to the ED with chief complaint of right groin pain.  Pain present since Monday after starting back at work.  Patient has been doing a lot of walking while at work.  Low suspicion for STI as patient denies any dysuria, hematuria, penile discharge, genital sores or lesions.  Will obtain urinalysis to evaluate for possible urinary tract infection however low suspicion at this time.  No signs of inguinal or femoral hernia on exam.  Will obtain ultrasound imaging to evaluate for occult hernia.  Patient denies any history of kidney dysfunction or blood during use.  Last CMP shows kidney function within normal limits.  We will give patient Toradol for his pain.  Patient reports improvement in his symptoms after receiving Toradol.  Ultrasound imaging shows no occult hernia.  Urinalysis is unremarkable.   Suspect that patient's pain is musculoskeletal in nature.  Patient is currently taking Mobic once daily.  Patient advised to use Tylenol as needed for further pain management.  Patient also advised to perform light stretching and use heat/ice as needed.  Patient to follow-up with primary care provider if symptoms did not improve.  While in emergency department patient was found to be hypertensive.  We will have patient follow-up in outpatient setting for repeat evaluation of his blood pressure.  Discussed results, findings, treatment and follow up. Patient advised of return precautions. Patient verbalized understanding and agreed with plan.   Final Clinical Impression(s) / ED Diagnoses Final diagnoses:  Right inguinal pain    Rx / DC Orders ED Discharge Orders     None        Loni Beckwith, PA-C 11/29/20 0388    Daleen Bo, MD 11/29/20 [phone]

## 2020-11-29 NOTE — Discharge Instructions (Addendum)
You came to the emergency department today to be evaluated for your groin pain.  Your physical exam was reassuring.  The ultrasound showed no hernias.  Your urinalysis showed no signs of infection.  Your pain is likely musculoskeletal in nature and should improve over time.  Please take your prescribed Mobic.  Additionally you may take Tylenol up to 1,000 mg (two extra strength pills) every 8 hours as needed.  Do not take more than 3,000 mg tylenol in a 24 hour period (not more than one dose every 8 hours.  Please check all medication labels as many medications such as pain and cold medications may contain tylenol.  Do not drink alcohol while taking these medications.  Please follow-up with your primary care provider if your symptoms do not improve.  While in the emergency department your blood pressure was found to be elevated.  Please follow-up with your primary care provider for reevaluation of your blood pressure.  Get help right away if: You have numbness or tingling in the injured area. You lose a lot of strength in the injured area.

## 2020-11-29 NOTE — ED Triage Notes (Signed)
Pt arrived via POV, c/o right groin pain that started Friday, gradual onset. Denies any known injury.

## 2020-11-30 ENCOUNTER — Ambulatory Visit: Payer: Medicare Other | Admitting: Oncology

## 2020-11-30 ENCOUNTER — Other Ambulatory Visit: Payer: Medicare Other

## 2020-12-05 ENCOUNTER — Ambulatory Visit (INDEPENDENT_AMBULATORY_CARE_PROVIDER_SITE_OTHER): Payer: BC Managed Care – PPO | Admitting: Internal Medicine

## 2020-12-05 ENCOUNTER — Encounter: Payer: Self-pay | Admitting: Internal Medicine

## 2020-12-05 ENCOUNTER — Other Ambulatory Visit: Payer: Self-pay

## 2020-12-05 VITALS — BP 134/70 | HR 70 | Resp 18 | Ht 71.0 in

## 2020-12-05 DIAGNOSIS — M26621 Arthralgia of right temporomandibular joint: Secondary | ICD-10-CM

## 2020-12-05 DIAGNOSIS — R1030 Lower abdominal pain, unspecified: Secondary | ICD-10-CM | POA: Diagnosis not present

## 2020-12-05 DIAGNOSIS — R1031 Right lower quadrant pain: Secondary | ICD-10-CM | POA: Diagnosis not present

## 2020-12-05 MED ORDER — KETOROLAC TROMETHAMINE 30 MG/ML IJ SOLN
30.0000 mg | Freq: Once | INTRAMUSCULAR | Status: AC
  Administered 2020-12-05: 30 mg via INTRAMUSCULAR

## 2020-12-05 MED ORDER — METHYLPREDNISOLONE ACETATE 40 MG/ML IJ SUSP
40.0000 mg | Freq: Once | INTRAMUSCULAR | Status: AC
Start: 2020-12-05 — End: 2020-12-05
  Administered 2020-12-05: 40 mg via INTRAMUSCULAR

## 2020-12-05 MED ORDER — MELOXICAM 15 MG PO TABS: 15.0000 mg | ORAL_TABLET | Freq: Every day | ORAL | 1 refills | Status: DC

## 2020-12-05 NOTE — Progress Notes (Signed)
   Subjective:   Patient ID: Grant Miller, male    DOB: 1975-04-24, 45 y.o.   MRN: [id number]  HPI The patient is a 45 YO man coming in for ER follow up groin pain and TMJ pain right side.   Review of Systems  Constitutional: Negative.   HENT: Negative.         Jaw pain right  Eyes: Negative.   Respiratory:  Negative for cough, chest tightness and shortness of breath.   Cardiovascular:  Negative for chest pain, palpitations and leg swelling.  Gastrointestinal:  Negative for abdominal distention, abdominal pain, constipation, diarrhea, nausea and vomiting.  Genitourinary:  Negative for difficulty urinating, dysuria, enuresis, frequency, genital sores, hematuria, penile discharge, penile pain, penile swelling, scrotal swelling, testicular pain and urgency.       Groin pain  Musculoskeletal: Negative.   Skin: Negative.   Neurological: Negative.   Psychiatric/Behavioral: Negative.     Objective:  Physical Exam Constitutional:      Appearance: He is well-developed.  HENT:     Head: Normocephalic and atraumatic.     Comments: Right sided TMJ tenderness Cardiovascular:     Rate and Rhythm: Normal rate and regular rhythm.  Pulmonary:     Effort: Pulmonary effort is normal. No respiratory distress.     Breath sounds: Normal breath sounds. No wheezing or rales.  Abdominal:     General: Bowel sounds are normal. There is no distension.     Palpations: Abdomen is soft.     Tenderness: There is no abdominal tenderness. There is no rebound.  Genitourinary:    Penis: Normal.      Testes: Normal.     Comments: No rash or lesion genitally, no testicular swelling or tenderness. Pain in the inguinal region right without lymphadenopathy. Pain exacerbated by movement of the right leg and in the right thigh region. Musculoskeletal:     Cervical back: Normal range of motion.  Skin:    General: Skin is warm and dry.  Neurological:     Mental Status: He is alert and oriented to person, place,  and time.     Coordination: Coordination normal.    Vitals:   12/05/20 1353  BP: 134/70  Pulse: 70  Resp: 18  SpO2: 98%  Height: 5\' 11"  (1.803 m)    This visit occurred during the SARS-CoV-2 public health emergency.  Safety protocols were in place, including screening questions prior to the visit, additional usage of staff PPE, and extensive cleaning of exam room while observing appropriate contact time as indicated for disinfecting solutions.   Assessment & Plan:  Depo-medrol 40 mg IM and toradol 30 mg IM given at visit

## 2020-12-05 NOTE — Patient Instructions (Signed)
We have given you a shot today and this should help in 1-2 days. If you are not feeling better let us know.

## 2020-12-08 DIAGNOSIS — R1031 Right lower quadrant pain: Secondary | ICD-10-CM | POA: Insufficient documentation

## 2020-12-08 DIAGNOSIS — M26629 Arthralgia of temporomandibular joint, unspecified side: Secondary | ICD-10-CM | POA: Insufficient documentation

## 2020-12-08 NOTE — Assessment & Plan Note (Signed)
US done at ER as well as U/A. No concerns for STI and symptoms are not typical. Location typical for musculoskeletal. Given toradol 30 mg IM and depo-medrol 40 mg IM today at visit. If no improvement in 1-2 weeks can pursue further imaging. No testicular pain or swelling or discharge or genital lesion on exam.

## 2020-12-08 NOTE — Assessment & Plan Note (Signed)
Likely flaring due to stress related to work and grinding/clenching teeth at night time. Given depo-medrol 40 mg IM and toradol 30 mg IM at visit. If no resolution advised to see his dentist to see if a mold can be made to reduce the stress/impact on his jaw joint during sleep.

## 2020-12-11 ENCOUNTER — Encounter: Payer: Self-pay | Admitting: Gastroenterology

## 2020-12-15 ENCOUNTER — Telehealth: Payer: Self-pay | Admitting: Internal Medicine

## 2020-12-15 NOTE — Telephone Encounter (Signed)
Pt has been informed and will reach out to his dentist to discuss.

## 2020-12-15 NOTE — Telephone Encounter (Signed)
Patient says he is still having symptoms of TMJ & is requesting provider send something over to pharmacy to help w/ this  Callback [phone]

## 2020-12-15 NOTE — Telephone Encounter (Signed)
I would recommend to see dentist for oral mouthguard to help.

## 2021-01-01 ENCOUNTER — Encounter: Payer: Self-pay | Admitting: Internal Medicine

## 2021-01-01 NOTE — Telephone Encounter (Signed)
error 

## 2021-01-02 ENCOUNTER — Other Ambulatory Visit: Payer: Self-pay

## 2021-01-02 ENCOUNTER — Ambulatory Visit (INDEPENDENT_AMBULATORY_CARE_PROVIDER_SITE_OTHER): Payer: BC Managed Care – PPO | Admitting: Internal Medicine

## 2021-01-02 ENCOUNTER — Encounter: Payer: Self-pay | Admitting: Internal Medicine

## 2021-01-02 ENCOUNTER — Other Ambulatory Visit: Payer: Self-pay | Admitting: Internal Medicine

## 2021-01-02 ENCOUNTER — Ambulatory Visit (INDEPENDENT_AMBULATORY_CARE_PROVIDER_SITE_OTHER): Payer: BC Managed Care – PPO

## 2021-01-02 VITALS — BP 162/90 | HR 83 | Temp 98.9°F | Ht 71.0 in | Wt 238.0 lb

## 2021-01-02 DIAGNOSIS — R6882 Decreased libido: Secondary | ICD-10-CM | POA: Insufficient documentation

## 2021-01-02 DIAGNOSIS — R109 Unspecified abdominal pain: Secondary | ICD-10-CM | POA: Insufficient documentation

## 2021-01-02 DIAGNOSIS — R1031 Right lower quadrant pain: Secondary | ICD-10-CM | POA: Diagnosis not present

## 2021-01-02 DIAGNOSIS — N500 Atrophy of testis: Secondary | ICD-10-CM | POA: Insufficient documentation

## 2021-01-02 DIAGNOSIS — E291 Testicular hypofunction: Secondary | ICD-10-CM | POA: Insufficient documentation

## 2021-01-02 MED ORDER — KETOROLAC TROMETHAMINE 30 MG/ML IJ SOLN
30.0000 mg | Freq: Once | INTRAMUSCULAR | Status: AC
  Administered 2021-01-03: 30 mg via INTRAVENOUS

## 2021-01-02 NOTE — Progress Notes (Signed)
   Subjective:   Patient ID: Grant Miller, male    DOB: 03/09/1975, 45 y.o.   MRN: [id number]  HPI The patient is a 45 YO man coming in for persistent right groin pain. Overall slightly less but persistent. Started 2-3 months ago without incident. Got temporary relief from toradol and depo-medrol about 1 month ago.   Review of Systems  Constitutional: Negative.   HENT: Negative.    Eyes: Negative.   Respiratory:  Negative for cough, chest tightness and shortness of breath.   Cardiovascular:  Negative for chest pain, palpitations and leg swelling.  Gastrointestinal:  Negative for abdominal distention, abdominal pain, constipation, diarrhea, nausea and vomiting.  Musculoskeletal:  Positive for arthralgias and myalgias.  Skin: Negative.   Neurological: Negative.   Psychiatric/Behavioral: Negative.     Objective:  Physical Exam Constitutional:      Appearance: He is well-developed.  HENT:     Head: Normocephalic and atraumatic.  Cardiovascular:     Rate and Rhythm: Normal rate and regular rhythm.  Pulmonary:     Effort: Pulmonary effort is normal. No respiratory distress.     Breath sounds: Normal breath sounds. No wheezing or rales.  Abdominal:     General: Bowel sounds are normal. There is no distension.     Palpations: Abdomen is soft.     Tenderness: There is no abdominal tenderness. There is no rebound.  Musculoskeletal:        General: Tenderness present.     Cervical back: Normal range of motion.     Comments: Pain right inguinal and with moving hip joint  Skin:    General: Skin is warm and dry.  Neurological:     Mental Status: He is alert and oriented to person, place, and time.     Coordination: Coordination normal.    Vitals:   01/02/21 1036  BP: (!) 162/90  Pulse: 83  Temp: 98.9 F (37.2 C)  TempSrc: Oral  SpO2: 97%  Weight: 238 lb (108 kg)  Height: 5\' 11"  (1.803 m)    This visit occurred during the SARS-CoV-2 public health emergency.  Safety protocols  were in place, including screening questions prior to the visit, additional usage of staff PPE, and extensive cleaning of exam room while observing appropriate contact time as indicated for disinfecting solutions.   Assessment & Plan:  Toradol 30 mg IM

## 2021-01-02 NOTE — Patient Instructions (Signed)
We have given you the shot today and will check the x-ray.

## 2021-01-02 NOTE — Assessment & Plan Note (Signed)
Given lack of resolution could be related to hip joint. Given 30 mg IM toradol at visit and checking x-ray hips today. Depending on results if not diagnostic may need CT pelvis to assess.

## 2021-01-03 ENCOUNTER — Ambulatory Visit (AMBULATORY_SURGERY_CENTER): Payer: Self-pay

## 2021-01-03 ENCOUNTER — Other Ambulatory Visit: Payer: Self-pay

## 2021-01-03 VITALS — Ht 71.0 in | Wt 240.0 lb

## 2021-01-03 DIAGNOSIS — Z1211 Encounter for screening for malignant neoplasm of colon: Secondary | ICD-10-CM

## 2021-01-03 MED ORDER — PEG 3350-KCL-NA BICARB-NACL 420 G PO SOLR: 4000.0000 mL | Freq: Once | ORAL | 0 refills | Status: AC

## 2021-01-03 NOTE — Progress Notes (Signed)
Denies allergies to eggs or soy products. Denies complication of anesthesia or sedation. Denies use of weight loss medication. Denies use of O2.   Emmi instructions given for colonoscopy.     Patient states that he is taking Phentermine 37.5 daily. Patient was informed to stop taking Phentermine 10 days prior to his procedure. Last dose should be Nov. 4. Patient was instructed that he could resume this medication after his procedure as directed by his physician. Patient verbalizes understanding.

## 2021-01-05 ENCOUNTER — Telehealth: Payer: Self-pay | Admitting: Internal Medicine

## 2021-01-05 DIAGNOSIS — R1031 Right lower quadrant pain: Secondary | ICD-10-CM

## 2021-01-05 NOTE — Telephone Encounter (Signed)
Referral done, pain could be related to the arthritis.

## 2021-01-05 NOTE — Telephone Encounter (Signed)
See below

## 2021-01-05 NOTE — Telephone Encounter (Signed)
Patient calling in  Would like to proceed w/ the sports medicine referral  Patient also says he is having sharp pain in his pelvis area.. wants to know if this is also coming from the arthritis in his hips   Please call [phone]
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
## 2021-01-10 ENCOUNTER — Other Ambulatory Visit: Payer: Self-pay

## 2021-01-10 ENCOUNTER — Ambulatory Visit: Payer: Self-pay

## 2021-01-10 ENCOUNTER — Ambulatory Visit (INDEPENDENT_AMBULATORY_CARE_PROVIDER_SITE_OTHER): Payer: BC Managed Care – PPO | Admitting: Sports Medicine

## 2021-01-10 ENCOUNTER — Encounter: Payer: Self-pay | Admitting: Sports Medicine

## 2021-01-10 VITALS — BP 122/82 | HR 62 | Ht 71.0 in | Wt 239.0 lb

## 2021-01-10 DIAGNOSIS — M25551 Pain in right hip: Secondary | ICD-10-CM | POA: Diagnosis not present

## 2021-01-10 DIAGNOSIS — M1611 Unilateral primary osteoarthritis, right hip: Secondary | ICD-10-CM

## 2021-01-10 NOTE — Patient Instructions (Addendum)
Good to see you  Injection given today Referral to PT sent in Work note given  Follow up in  4 weeks

## 2021-01-16 ENCOUNTER — Telehealth: Payer: Self-pay | Admitting: Internal Medicine

## 2021-01-16 ENCOUNTER — Telehealth: Payer: Self-pay | Admitting: Gastroenterology

## 2021-01-16 NOTE — Telephone Encounter (Signed)
Returned pt's call and informed him that he could take the testosterone injection the day of his procedure and that it would not affect the procedure.

## 2021-01-16 NOTE — Telephone Encounter (Signed)
Inbound call from patient requesting a call back to discuss whether it is okay to have T shot the morning of procedure 11/16

## 2021-01-16 NOTE — Telephone Encounter (Signed)
Patient calling in  Says his [employer] faxed over to our office some short term disability forms to be completed by Dr. Sharlet Salina on his behalf  Please let patient know when forms have been completed & faxed back to employer

## 2021-01-16 NOTE — Telephone Encounter (Signed)
Noted  

## 2021-01-17 ENCOUNTER — Other Ambulatory Visit: Payer: Self-pay

## 2021-01-17 ENCOUNTER — Encounter: Payer: Self-pay | Admitting: Gastroenterology

## 2021-01-17 ENCOUNTER — Ambulatory Visit (AMBULATORY_SURGERY_CENTER): Payer: BC Managed Care – PPO | Admitting: Gastroenterology

## 2021-01-17 VITALS — BP 143/88 | HR 58 | Temp 98.9°F | Resp 10 | Ht 71.0 in | Wt 240.0 lb

## 2021-01-17 DIAGNOSIS — K635 Polyp of colon: Secondary | ICD-10-CM

## 2021-01-17 DIAGNOSIS — Z1211 Encounter for screening for malignant neoplasm of colon: Secondary | ICD-10-CM | POA: Diagnosis present

## 2021-01-17 DIAGNOSIS — D127 Benign neoplasm of rectosigmoid junction: Secondary | ICD-10-CM

## 2021-01-17 DIAGNOSIS — D128 Benign neoplasm of rectum: Secondary | ICD-10-CM

## 2021-01-17 DIAGNOSIS — K621 Rectal polyp: Secondary | ICD-10-CM | POA: Diagnosis not present

## 2021-01-17 DIAGNOSIS — D123 Benign neoplasm of transverse colon: Secondary | ICD-10-CM

## 2021-01-17 DIAGNOSIS — D125 Benign neoplasm of sigmoid colon: Secondary | ICD-10-CM

## 2021-01-17 DIAGNOSIS — D122 Benign neoplasm of ascending colon: Secondary | ICD-10-CM

## 2021-01-17 MED ORDER — SODIUM CHLORIDE 0.9 % IV SOLN: 500.0000 mL | Freq: Once | INTRAVENOUS | Status: DC

## 2021-01-17 NOTE — Op Note (Signed)
[facility] Patient Name: Grant Miller Procedure Date: 01/17/2021 9:03 AM MRN: [id number] Endoscopist: Mauri Pole , MD Age: 45 Referring MD:  Date of Birth: 1975-07-18 Gender: Male Account #: [id number] Procedure:                Colonoscopy Indications:              Screening for colorectal malignant neoplasm Medicines:                Monitored Anesthesia Care Procedure:                Pre-Anesthesia Assessment:                           - Prior to the procedure, a History and Physical                            was performed, and patient medications and                            allergies were reviewed. The patient's tolerance of                            previous anesthesia was also reviewed. The risks                            and benefits of the procedure and the sedation                            options and risks were discussed with the patient.                            All questions were answered, and informed consent                            was obtained. Prior Anticoagulants: The patient has                            taken no previous anticoagulant or antiplatelet                            agents. ASA Grade Assessment: II - A patient with                            mild systemic disease. After reviewing the risks                            and benefits, the patient was deemed in                            satisfactory condition to undergo the procedure.                           After obtaining informed consent, the colonoscope  was passed under direct vision. Throughout the                            procedure, the patient's blood pressure, pulse, and                            oxygen saturations were monitored continuously. The                            Olympus PCF-H190DL [id number]) Colonoscope was                            introduced through the anus and advanced to the the                            cecum,  identified by appendiceal orifice and                            ileocecal valve. The colonoscopy was performed                            without difficulty. The patient tolerated the                            procedure well. The quality of the bowel                            preparation was good. The ileocecal valve,                            appendiceal orifice, and rectum were photographed. Scope In: 9:09:09 AM Scope Out: 9:27:19 AM Scope Withdrawal Time: 0 hours 10 minutes 31 seconds  Total Procedure Duration: 0 hours 18 minutes 10 seconds  Findings:                 The perianal and digital rectal examinations were                            normal.                           Five sessile polyps were found in the rectum,                            sigmoid colon and ascending colon. The polyps were                            1 to 2 mm in size. These polyps were removed with a                            cold biopsy forceps. Resection and retrieval were                            complete.  A 5 mm polyp was found in the transverse colon. The                            polyp was sessile. The polyp was removed with a                            cold snare. Resection and retrieval were complete.                           Scattered small and large-mouthed diverticula were                            found in the sigmoid colon, descending colon,                            transverse colon and ascending colon.                           Non-bleeding external and internal hemorrhoids were                            found during retroflexion. The hemorrhoids were                            small. Complications:            No immediate complications. Estimated Blood Loss:     Estimated blood loss was minimal. Impression:               - Five 1 to 2 mm polyps in the rectum, in the                            sigmoid colon and in the ascending colon, removed                             with a cold biopsy forceps. Resected and retrieved.                           - One 5 mm polyp in the transverse colon, removed                            with a cold snare. Resected and retrieved.                           - Diverticulosis in the sigmoid colon, in the                            descending colon, in the transverse colon and in                            the ascending colon.                           - Non-bleeding external and internal hemorrhoids. Recommendation:           -  Patient has a contact number available for                            emergencies. The signs and symptoms of potential                            delayed complications were discussed with the                            patient. Return to normal activities tomorrow.                            Written discharge instructions were provided to the                            patient.                           - Resume previous diet.                           - Continue present medications.                           - Await pathology results.                           - Repeat colonoscopy in 3 - 10 years for                            surveillance based on pathology results. Mauri Pole, MD 01/17/2021 9:32:03 AM This report has been signed electronically.

## 2021-01-17 NOTE — Patient Instructions (Signed)
Resume previous diet and medications. Awaiting pathology results. Repeat Colonoscopy in 3-5 years for surveillance.  YOU HAD AN ENDOSCOPIC PROCEDURE TODAY AT [facility]:   Refer to the procedure report that was given to you for any specific questions about what was found during the examination.  If the procedure report does not answer your questions, please call your gastroenterologist to clarify.  If you requested that your care partner not be given the details of your procedure findings, then the procedure report has been included in a sealed envelope for you to review at your convenience later.  YOU SHOULD EXPECT: Some feelings of bloating in the abdomen. Passage of more gas than usual.  Walking can help get rid of the air that was put into your GI tract during the procedure and reduce the bloating. If you had a lower endoscopy (such as a colonoscopy or flexible sigmoidoscopy) you may notice spotting of blood in your stool or on the toilet paper. If you underwent a bowel prep for your procedure, you may not have a normal bowel movement for a few days.  Please Note:  You might notice some irritation and congestion in your nose or some drainage.  This is from the oxygen used during your procedure.  There is no need for concern and it should clear up in a day or so.  SYMPTOMS TO REPORT IMMEDIATELY:  Following lower endoscopy (colonoscopy or flexible sigmoidoscopy):  Excessive amounts of blood in the stool  Significant tenderness or worsening of abdominal pains  Swelling of the abdomen that is new, acute  Fever of 100F or higher  For urgent or emergent issues, a gastroenterologist can be reached at any hour by calling [phone]. Do not use MyChart messaging for urgent concerns.    DIET:  We do recommend a small meal at first, but then you may proceed to your regular diet.  Drink plenty of fluids but you should avoid alcoholic beverages for 24 hours.  ACTIVITY:  You should  plan to take it easy for the rest of today and you should NOT DRIVE or use heavy machinery until tomorrow (because of the sedation medicines used during the test).    FOLLOW UP: Our staff will call the number listed on your records 48-72 hours following your procedure to check on you and address any questions or concerns that you may have regarding the information given to you following your procedure. If we do not reach you, we will leave a message.  We will attempt to reach you two times.  During this call, we will ask if you have developed any symptoms of COVID 19. If you develop any symptoms (ie: fever, flu-like symptoms, shortness of breath, cough etc.) before then, please call [phone].  If you test positive for Covid 19 in the 2 weeks post procedure, please call and report this information to [place].    If any biopsies were taken you will be contacted by phone or by letter within the next 1-3 weeks.  Please call us at [phone] if you have not heard about the biopsies in 3 weeks.    SIGNATURES/CONFIDENTIALITY: You and/or your care partner have signed paperwork which will be entered into your electronic medical record.  These signatures attest to the fact that that the information above on your After Visit Summary has been reviewed and is understood.  Full responsibility of the confidentiality of this discharge information lies with you and/or your care-partner.

## 2021-01-17 NOTE — Progress Notes (Signed)
Called to room to assist during endoscopic procedure.  Patient ID and intended procedure confirmed with present staff. Received instructions for my participation in the procedure from the performing physician.  

## 2021-01-17 NOTE — Progress Notes (Signed)
Pt's states no medical or surgical changes since previsit or office visit. 

## 2021-01-17 NOTE — Progress Notes (Signed)
Report given to PACU, vss 

## 2021-01-17 NOTE — Progress Notes (Signed)
[facility] History and Physical   Primary Care Physician:  Hoyt Koch, MD   Reason for Procedure:  Colorectal cancer screening  Plan:    Screening colonoscopy with possible interventions as needed     HPI: Grant Miller is a very pleasant 45 y.o. malehere for screening colonoscopy. Denies any nausea, vomiting, abdominal pain, melena or bright red blood per rectum  The risks and benefits as well as alternatives of endoscopic procedure(s) have been discussed and reviewed. All questions answered. The patient agrees to proceed.    Past Medical History:  Diagnosis Date   Allergy    Anxiety    Arthritis    Depression     Past Surgical History:  Procedure Laterality Date   ABDOMINAL SURGERY     right wrist surgery     tumor on left leg Left     Prior to Admission medications   Medication Sig Start Date End Date Taking? Authorizing Provider  anastrozole (ARIMIDEX) 1 MG tablet Take 1 mg by mouth once a week. Saturdays 11/14/20  Yes [provider]  BIOTIN PO Take 1 tablet by mouth daily.   Yes [provider]  Cholecalciferol (D3 PO) Take 1 capsule by mouth daily.   Yes [provider]  ibuprofen (ADVIL) 800 MG tablet Take 800 mg by mouth 3 (three) times daily as needed. 12/04/20  Yes [provider]  meloxicam (MOBIC) 15 MG tablet Take 1 tablet (15 mg total) by mouth daily. 12/05/20  Yes Hoyt Koch, MD  Multiple Vitamins-Minerals (ZINC PO) Take 1 tablet by mouth daily.   Yes [provider]  testosterone cypionate (DEPOTESTOSTERONE CYPIONATE) 200 MG/ML injection Inject 0.8 mLs into the muscle once a week.   Yes [provider]  phentermine 37.5 MG capsule Take 37.5 mg by mouth every morning.    [provider]    Current Outpatient Medications  Medication Sig Dispense Refill   anastrozole (ARIMIDEX) 1 MG tablet Take 1 mg by mouth once a week. Saturdays     BIOTIN PO Take 1 tablet  by mouth daily.     Cholecalciferol (D3 PO) Take 1 capsule by mouth daily.     ibuprofen (ADVIL) 800 MG tablet Take 800 mg by mouth 3 (three) times daily as needed.     meloxicam (MOBIC) 15 MG tablet Take 1 tablet (15 mg total) by mouth daily. 90 tablet 1   Multiple Vitamins-Minerals (ZINC PO) Take 1 tablet by mouth daily.     testosterone cypionate (DEPOTESTOSTERONE CYPIONATE) 200 MG/ML injection Inject 0.8 mLs into the muscle once a week.     phentermine 37.5 MG capsule Take 37.5 mg by mouth every morning.     Current Facility-Administered Medications  Medication Dose Route Frequency Provider Last Rate Last Admin   0.9 %  sodium chloride infusion  500 mL Intravenous Once Mauri Pole, MD        Allergies as of 01/17/2021 - Review Complete 01/17/2021  Allergen Reaction Noted   Iodine Hives and Shortness Of Breath 07/31/2011   Other Hives and Shortness Of Breath 09/01/2015   Shellfish allergy  02/06/2015    Family History  Problem Relation Age of Onset   Heart disease Mother    Hyperlipidemia Mother    Kidney disease Mother    Diabetes Mother    Colon cancer Neg Hx    Esophageal cancer Neg Hx    Rectal cancer Neg Hx    Stomach cancer Neg Hx  Social History   Socioeconomic History   Marital status: Single    Spouse name: Not on file   Number of children: Not on file   Years of education: Not on file   Highest education level: Not on file  Occupational History   Not on file  Tobacco Use   Smoking status: Former    Types: Cigarettes    Quit date: 2003    Years since quitting: 19.8   Smokeless tobacco: Never  Vaping Use   Vaping Use: Never used  Substance and Sexual Activity   Alcohol use: No   Drug use: No   Sexual activity: Not on file  Other Topics Concern   Not on file  Social History Narrative   Not on file   Social Determinants of Health   Financial Resource Strain: Not on file  Food Insecurity: Not on file  Transportation Needs: Not on file   Physical Activity: Not on file  Stress: Not on file  Social Connections: Not on file  Intimate Partner Violence: Not on file    Review of Systems:  All other review of systems negative except as mentioned in the HPI.  Physical Exam: Vital signs in last 24 hours: BP 125/66   Pulse 80   Temp 98.9 F (37.2 C) (Temporal)   Ht 5\' 11"  (1.803 m)   Wt 240 lb (108.9 kg)   SpO2 96%   BMI 33.47 kg/m     General:   Alert, NAD Lungs:  Clear .   Heart:  Regular rate and rhythm Abdomen:  Soft, nontender and nondistended. Neuro/Psych:  Alert and cooperative. Normal mood and affect. A and O x 3  Reviewed labs, radiology imaging, old records and pertinent past GI work up  Patient is appropriate for planned procedure(s) and anesthesia in an ambulatory setting   K. Denzil Magnuson , MD [phone]

## 2021-01-19 ENCOUNTER — Telehealth: Payer: Self-pay

## 2021-01-19 NOTE — Telephone Encounter (Signed)
  Follow up Call-  Call back number 01/17/2021  Post procedure Call Back phone  # [phone]  Permission to leave phone message Yes  Some recent data might be hidden     Patient questions:  Do you have a fever, pain , or abdominal swelling? No. Pain Score  0 *  Have you tolerated food without any problems? Yes.    Have you been able to return to your normal activities? Yes.    Do you have any questions about your discharge instructions: Diet   No. Medications  No. Follow up visit  No.  Do you have questions or concerns about your Care? No.  Actions: * If pain score is 4 or above: No action needed, pain <4.  Have you developed a fever since your procedure? no  2.   Have you had an respiratory symptoms (SOB or cough) since your procedure? no  3.   Have you tested positive for COVID 19 since your procedure no  4.   Have you had any family members/close contacts diagnosed with the COVID 19 since your procedure?  no   If yes to any of these questions please route to Joylene John, RN and Joella Prince, RN

## 2021-01-22 NOTE — Telephone Encounter (Signed)
Patient requesting a call back to discuss fmla forms

## 2021-01-23 NOTE — Telephone Encounter (Signed)
Spoke with the patient and he needs to the dates from when he was taken out of work.  Office visit on 12/05/2020 returned to work on 12/11/2020 Office visit on 01/02/2021 returned to work on 01/08/2021

## 2021-01-23 NOTE — Telephone Encounter (Signed)
Forms filled out and did 2 separate forms one for each episode.

## 2021-01-30 ENCOUNTER — Telehealth: Payer: Self-pay | Admitting: Internal Medicine

## 2021-01-30 NOTE — Telephone Encounter (Signed)
Patient states employer has not received excuse note for ov on  12/05/2020 returned to work on 12/11/2020 01/02/2021 returned to work on 01/08/2021  Patient is requesting a call back to discuss note and delivery methods

## 2021-01-30 NOTE — Telephone Encounter (Signed)
Called pt. LVM letting know that his paperwork was faxed over on November 22. Paperwork was re-faxed again this morning to the number that was provided in the forms.

## 2021-02-02 ENCOUNTER — Encounter: Payer: Self-pay | Admitting: Gastroenterology

## 2021-02-06 NOTE — Progress Notes (Signed)
Grant Miller [place] Phone: [phone]   Assessment and Plan:     1. Osteoarthritis of right hip, unspecified osteoarthritis type  -Chronic with exacerbation, subsequent visit - Moderate improvement in symptoms and hip pain after CSI intra-articular right hip at previous office visit.  Since patient's perceived moderate pain relief, it is most likely that patient's pain generator is intra-articular - Educated to limit full hip flexion, internal rotation, external rotation as possible - Use Tylenol/NSAIDs and rest if pain returns, and if pain does not resolve with this plan then follow-up to clinic for reevaluation   Pertinent previous records reviewed include none   Follow Up: 2 months for reevaluation of hip pain.  Could consider repeat CSI at that time   Subjective:    I, Grant Miller, am serving as a Education administrator for Doctor Grant Miller  Chief Complaint: right groin pain   HPI:   01/10/21 Patient is a 46 year old male presenting with persistent right groin pain X2-3 months with sharp pain, but has been there since January. Patient was last seen by his PCP on 01/02/21 for this reason and was given a toradol injection to help with pain and a hip x ray. Patient was referred to sports medicine for diagnosis and treatment. Today patient states that he had started some stretches to help with the pain and has helped with the sharp pain. Pain will take ibuprofen 800mg  once daily to help with pain, patient is on light duty for work for the moment, Ice twice a day. 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    02/07/2021 Patient states that flares up here and there but the injection did help. Scheduled PT for January. Notices when he is laying on his back and rotates his leg out he has pain. Hasn't been using it since he is on light duty at work    Radiates:  Mechanical  symptoms: Numbness/tingling: Weakness: Aggravates: Treatments tried:   Relevant Historical Information: None pertinent  Additional pertinent review of systems negative.   Current Outpatient Medications:    anastrozole (ARIMIDEX) 1 MG tablet, Take 1 mg by mouth once a week. Saturdays, Disp: , Rfl:    BIOTIN PO, Take 1 tablet by mouth daily., Disp: , Rfl:    Cholecalciferol (D3 PO), Take 1 capsule by mouth daily., Disp: , Rfl:    ibuprofen (ADVIL) 800 MG tablet, Take 800 mg by mouth 3 (three) times daily as needed., Disp: , Rfl:    meloxicam (MOBIC) 15 MG tablet, Take 1 tablet (15 mg total) by mouth daily., Disp: 90 tablet, Rfl: 1   Multiple Vitamins-Minerals (ZINC PO), Take 1 tablet by mouth daily., Disp: , Rfl:    phentermine 37.5 MG capsule, Take 37.5 mg by mouth every morning., Disp: , Rfl:    testosterone cypionate (DEPOTESTOSTERONE CYPIONATE) 200 MG/ML injection, Inject 0.8 mLs into the muscle once a week., Disp: , Rfl:    Objective:     Vitals:   02/07/21 0835  Pulse: 72  SpO2: 96%  Weight: 235 lb (106.6 kg)  Height: 5\' 11"  (1.803 m)      Body mass index is 32.78 kg/m.    Physical Exam:    General: awake, alert, and oriented no acute distress, nontoxic Skin: no suspicious lesions or rashes Neuro:sensation intact distally with no dificits, normal muscle tone, no atrophy, strength 5/5 in all tested lower  ext groups Psych: normal mood and affect, speech clear  Right hip: No deformity, swelling or wasting ROM Fexion 90, ext 30, IR 45, ER 45 TTP mildly over hip flexors NTTP over the greater troch, glute musculature, si joint, lumbar spine Negative log roll with FROM +FABER +FADIR Negative Piriformis test Negative trendelenberg Gait normal    Electronically signed by:  Grant Miller D.[facility] 9:25 AM 02/07/21

## 2021-02-07 ENCOUNTER — Other Ambulatory Visit: Payer: Self-pay

## 2021-02-07 ENCOUNTER — Ambulatory Visit (INDEPENDENT_AMBULATORY_CARE_PROVIDER_SITE_OTHER): Payer: BC Managed Care – PPO | Admitting: Sports Medicine

## 2021-02-07 ENCOUNTER — Telehealth: Payer: Self-pay | Admitting: Internal Medicine

## 2021-02-07 VITALS — HR 72 | Ht 71.0 in | Wt 235.0 lb

## 2021-02-07 DIAGNOSIS — M1611 Unilateral primary osteoarthritis, right hip: Secondary | ICD-10-CM

## 2021-02-07 NOTE — Patient Instructions (Addendum)
Good to see you  If you have a flare up of hip pain take Tylenol or Ibuprofen and schedule a visit sooner 2 month follow up

## 2021-02-27 DIAGNOSIS — E291 Testicular hypofunction: Secondary | ICD-10-CM | POA: Diagnosis not present

## 2021-03-06 ENCOUNTER — Ambulatory Visit: Payer: BC Managed Care – PPO | Admitting: Physical Therapy

## 2021-03-06 NOTE — Therapy (Incomplete)
OUTPATIENT PHYSICAL THERAPY LOWER EXTREMITY EVALUATION   Patient Name: Grant Miller MRN: [id number] DOB:February 24, 1976, 46 y.o., male Today's Date: 03/06/2021    Past Medical History:  Diagnosis Date   Allergy    Anxiety    Arthritis    Depression    Past Surgical History:  Procedure Laterality Date   ABDOMINAL SURGERY     right wrist surgery     tumor on left leg Left    Patient Active Problem List   Diagnosis Date Noted   Abdominal pain 01/02/2021   Low libido 01/02/2021   Testicular atrophy 01/02/2021   Testicular hypofunction 01/02/2021   Right inguinal pain 12/08/2020   TMJ arthralgia 12/08/2020   Neck pain on left side 08/17/2020   Bilateral carpal tunnel syndrome 12/08/2019   Lumbar disc disease 12/08/2019   Arthritis of lumbar spine 12/08/2019   Attention and concentration deficit 04/30/2019   Chronic bilateral low back pain without sciatica 02/03/2018   Impaired fasting blood sugar 10/20/2017   Right foot pain 10/20/2017   Left shoulder pain 04/17/2017   Ganglion cyst of wrist, right 08/09/2016   Snoring 01/20/2016   ED (erectile dysfunction) 01/20/2016   Routine general medical examination at a health care facility 01/20/2016   Non-healing surgical wound 02/06/2015   Hx of abdominal surgery 09/28/2014   Leukopenia 02/04/2014   Obesity 06/15/2007   SARCOIDOSIS 06/11/2007    PCP: Hoyt Koch, MD  REFERRING PROVIDER: Glennon Mac, DO  REFERRING DIAG:  W29.562 (ICD-10-CM) - Right hip pain  M16.11 (ICD-10-CM) - Osteoarthritis of right hip, unspecified osteoarthritis type    THERAPY DIAG:  No diagnosis found.  ONSET DATE: ***  SUBJECTIVE:   SUBJECTIVE STATEMENT: ***  PERTINENT HISTORY: ***  PAIN:  Are you having pain? {yes/no:20286} VAS scale: ***/10 Pain location: *** Pain orientation: {Pain Orientation:25161}  PAIN TYPE: {type:313116} Pain description: {PAIN DESCRIPTION:21022940}  Aggravating factors: *** Relieving  factors: ***  PRECAUTIONS: {Therapy precautions:24002}  WEIGHT BEARING RESTRICTIONS {Yes ***/No:24003}  FALLS:  Has patient fallen in last 6 months? {yes/no:20286}, Number of falls: ***  LIVING ENVIRONMENT: Lives with: {[facility] lives with:25569::"lives with their family"} Lives in: {Lives in:25570} Stairs: {yes/no:20286}; {Stairs:24000} Has following equipment at home: {Assistive devices:23999}  OCCUPATION: ***  PLOF: {PLOF:24004}  PATIENT GOALS ***   OBJECTIVE:   DIAGNOSTIC FINDINGS: ***  PATIENT SURVEYS:  FOTO ***  COGNITION:  Overall cognitive status: {cognition:24006}     SENSATION:  Light touch: {intact/deficits:24005}  Stereognosis: {intact/deficits:24005}  Hot/Cold: {intact/deficits:24005}  Proprioception: {intact/deficits:24005}  MUSCLE LENGTH: Hamstrings: Right *** deg; Left *** deg Thomas test: Right *** deg; Left *** deg  POSTURE:  ***  PALPATION: ***  LE AROM/PROM:  A/PROM Right 03/06/2021 Left 03/06/2021  Hip flexion    Hip extension    Hip abduction    Hip adduction    Hip internal rotation    Hip external rotation    Knee flexion    Knee extension    Ankle dorsiflexion    Ankle plantarflexion    Ankle inversion    Ankle eversion     (Blank rows = not tested)  LE MMT:  MMT Right 03/06/2021 Left 03/06/2021  Hip flexion    Hip extension    Hip abduction    Hip adduction    Hip internal rotation    Hip external rotation    Knee flexion    Knee extension    Ankle dorsiflexion    Ankle plantarflexion    Ankle inversion    Ankle  eversion     (Blank rows = not tested)  LOWER EXTREMITY SPECIAL TESTS:  Hip special tests: Saralyn Pilar (FABER) test: {pos/neg:25230}, Thomas test: {pos/neg:25230}, and Hip scouring test: {pos/neg:25230}  FUNCTIONAL TESTS:  {Functional tests:24029}  GAIT: Distance walked: *** Assistive device utilized: {Assistive devices:23999} Level of assistance: {Levels of assistance:24026} Comments: ***    TODAY'S  TREATMENT: ***   PATIENT EDUCATION:  Education details: *** Person educated: {Person educated:25204} Education method: {Education Method:25205} Education comprehension: {Education Comprehension:25206}   HOME EXERCISE PROGRAM: ***  ASSESSMENT:  CLINICAL IMPRESSION: Patient is a *** y.o. *** who was seen today for physical therapy evaluation and treatment for ***. Objective impairments include {opptimpairments:25111}. These impairments are limiting patient from {activity limitations:25113}. Personal factors including {Personal factors:25162} are also affecting patient's functional outcome. Patient will benefit from skilled PT to address above impairments and improve overall function.  REHAB POTENTIAL: {rehabpotential:25112}  CLINICAL DECISION MAKING: {clinical decision making:25114}  EVALUATION COMPLEXITY: {Evaluation complexity:25115}   GOALS: Goals reviewed with patient? {yes/no:20286}  SHORT TERM GOALS:  STG Name Target Date Goal status  1 *** Baseline:  {follow up:25551} {GOALSTATUS:25110}  2 *** Baseline:  {follow up:25551} {GOALSTATUS:25110}  3 *** Baseline: {follow up:25551} {GOALSTATUS:25110}  4 *** Baseline: {follow up:25551} {GOALSTATUS:25110}  5 *** Baseline: {follow up:25551} {GOALSTATUS:25110}  6 *** Baseline: {follow up:25551} {GOALSTATUS:25110}  7 *** Baseline: {follow up:25551} {GOALSTATUS:25110}   LONG TERM GOALS:   LTG Name Target Date Goal status  1 *** Baseline: {follow up:25551} {GOALSTATUS:25110}  2 *** Baseline: {follow up:25551} {GOALSTATUS:25110}  3 *** Baseline: {follow up:25551} {GOALSTATUS:25110}  4 *** Baseline: {follow up:25551} {GOALSTATUS:25110}  5 *** Baseline: {follow up:25551} {GOALSTATUS:25110}  6 *** Baseline: {follow up:25551} {GOALSTATUS:25110}  7 *** Baseline: {follow up:25551} {GOALSTATUS:25110}   PLAN: PT FREQUENCY: {rehab frequency:25116}  PT DURATION: {rehab duration:25117}  PLANNED INTERVENTIONS: {rehab  planned interventions:25118::"Therapeutic exercises","Therapeutic activity","Neuro Muscular re-education","Balance training","Gait training","Patient/Family education","Joint mobilization"}  PLAN FOR NEXT SESSION: ***   Dorothea Ogle 03/06/2021, 7:58 AM

## 2021-03-08 DIAGNOSIS — M5412 Radiculopathy, cervical region: Secondary | ICD-10-CM | POA: Diagnosis not present

## 2021-03-09 ENCOUNTER — Ambulatory Visit: Payer: Medicare Other | Admitting: Physical Therapy

## 2021-03-15 NOTE — Therapy (Deleted)
OUTPATIENT PHYSICAL THERAPY LOWER EXTREMITY EVALUATION   Patient Name: Grant Miller MRN: [id number] DOB:Jul 18, 1975, 46 y.o., male Today's Date: 03/15/2021    Past Medical History:  Diagnosis Date   Allergy    Anxiety    Arthritis    Depression    Past Surgical History:  Procedure Laterality Date   ABDOMINAL SURGERY     right wrist surgery     tumor on left leg Left    Patient Active Problem List   Diagnosis Date Noted   Abdominal pain 01/02/2021   Low libido 01/02/2021   Testicular atrophy 01/02/2021   Testicular hypofunction 01/02/2021   Right inguinal pain 12/08/2020   TMJ arthralgia 12/08/2020   Neck pain on left side 08/17/2020   Bilateral carpal tunnel syndrome 12/08/2019   Lumbar disc disease 12/08/2019   Arthritis of lumbar spine 12/08/2019   Attention and concentration deficit 04/30/2019   Chronic bilateral low back pain without sciatica 02/03/2018   Impaired fasting blood sugar 10/20/2017   Right foot pain 10/20/2017   Left shoulder pain 04/17/2017   Ganglion cyst of wrist, right 08/09/2016   Snoring 01/20/2016   ED (erectile dysfunction) 01/20/2016   Routine general medical examination at a health care facility 01/20/2016   Non-healing surgical wound 02/06/2015   Hx of abdominal surgery 09/28/2014   Leukopenia 02/04/2014   Obesity 06/15/2007   SARCOIDOSIS 06/11/2007    PCP: Hoyt Koch, MD  REFERRING PROVIDER: Glennon Mac, DO  REFERRING DIAG:   F64.332 (ICD-10-CM) - Right hip pain  M16.11 (ICD-10-CM) - Osteoarthritis of right hip, unspecified osteoarthritis type    THERAPY DIAG:  No diagnosis found.  ONSET DATE: ***  SUBJECTIVE:                                                                                                                                                                                           Grant Miller is a 46 y.o. male who presents to clinic with chief complaint of R hip pain).  MOI/History of  condition:  ***  From referring provider:   "1. Osteoarthritis of right hip, unspecified osteoarthritis type  -Chronic with exacerbation, subsequent visit - Moderate improvement in symptoms and hip pain after CSI intra-articular right hip at previous office visit.  Since patient's perceived moderate pain relief, it is most likely that patient's pain generator is intra-articular - Educated to limit full hip flexion, internal rotation, external rotation as possible - Use Tylenol/NSAIDs and rest if pain returns, and if pain does not resolve with this plan then follow-up to clinic for reevaluation"   Red flags:  {has/denies:26543} {kerredflag:26542}  Pertinent past history:  ***  Pain:  Are you having pain? {yes/no:20286} Pain location: *** NPRS scale:  highest {NUMBERS; 0-10:5044}/10 current {NUMBERS; 0-10:5044}/10  best {NUMBERS; 0-10:5044}/10 Aggravating factors: *** Relieving factors: *** Pain description: {PAIN DESCRIPTION:21022940} Severity: {Desc; low/moderate/high:110033} Irritability: {Desc; low/moderate/high:110033} Stage: {Desc; acute/subacute/chronic:13799} Stability: {kerbetterworse:26715} 24 hour pattern: ***   Occupation: ***  Hobbies/Recreation: ***  Assistive Device: ***   Patient Goals: ***   PRECAUTIONS: {Therapy precautions:24002}  WEIGHT BEARING RESTRICTIONS {Yes ***/No:24003}  FALLS:  Has patient fallen in last 6 months? {yes/no:20286}, Number of falls: ***  LIVING ENVIRONMENT: Lives with: {[facility] lives with:25569::"lives with their family"} Stairs: {yes/no:20286}; {Stairs:24000}  PLOF: {PLOF:24004}  DIAGNOSTIC FINDINGS:   11/01:  FINDINGS: There is no acute fracture or dislocation. The bones are demineralized. Mild bilateral hip arthritic changes. The soft tissues are unremarkable.   IMPRESSION:   OBJECTIVE:    GENERAL OBSERVATION:   ***  SENSATION:  Light touch: {intact/deficits:24005}  PALPATION: ***  MUSCLE  LENGTH: Hamstrings: Right *** deg; Left *** deg [name] test: Right (***); Left (***) Ely's test: Right (***); Left (***) Ober's test: Right (***); Left (***)  LE MMT:  MMT Right 03/15/2021 Left 03/15/2021  Hip flexion (L2, L3) *** ***  Knee extension (L3)    Knee flexion    Hip abduction *** ***  Hip extension *** ***  Hip external rotation    Hip internal rotation    Hip adduction    Ankle dorsiflexion (L4)    Ankle plantarflexion (S1)    Ankle inversion    Ankle eversion    Great Toe ext (L5)     (Blank rows = not tested, score listed is out of 5 possible points.  N = WNL, D = diminished, C = clear for gross weakness with myotome testing, * = concordant pain with testing)  LE ROM:  ROM Right 03/15/2021 Left 03/15/2021  Hip flexion *** ***  Hip extension    Hip abduction    Hip adduction    Hip internal rotation *** ***  Hip external rotation *** ***  Knee flexion    Knee extension    Ankle dorsiflexion    Ankle plantarflexion    Ankle inversion    Ankle eversion      (Blank rows = not tested, * = concordant pain)  LOWER EXTREMITY SPECIAL TESTS:  Knee special tests: {KNEE SPECIAL TESTS:26240}  FUNCTIONAL TESTS:  ***  GAIT: Comments: ***  PATIENT SURVEYS:  {rehab surveys:24030}    TODAY'S TREATMENT: ***   PATIENT EDUCATION:  POC, diagnosis, prognosis, HEP, and outcome measures.  Pt educated via explanation, demonstration, and handout (HEP).  Pt confirms understanding verbally.    HOME EXERCISE PROGRAM: ***  ASSESSMENT:  CLINICAL IMPRESSION: Culley is a 46 y.o. male who presents to clinic with signs and sxs consistent with ***.  Patient presents with pain and impairments/deficits in: ***.  Activity limitations include: ***.  Participation limitations include: ***.  Patient will benefit from skilled therapy to address pain and the listed deficits in order to achieve functional goals, enable safety and independence in completion of daily tasks, and  return to PLOF.   REHAB POTENTIAL: {rehabpotential:25112}  CLINICAL DECISION MAKING: {clinical decision making:25114}  EVALUATION COMPLEXITY: {Evaluation complexity:25115}   GOALS:  SHORT TERM GOALS:  STG Name Target Date Goal status  1 [name] will be >75% HEP compliant to improve carryover between sessions and facilitate independent management of condition  Baseline: No HEP 04/05/2021 INITIAL   LONG TERM GOALS:   LTG Name Target Date Goal  status  1 *** 05/10/2021 INITIAL  2 *** 05/10/2021 INITIAL  3 *** 05/10/2021 INITIAL  4 *** 05/10/2021 INITIAL  5 *** 05/10/2021 INITIAL  6 *** 05/10/2021 INITIAL  7 *** 05/10/2021 INITIAL   PLAN: PT FREQUENCY: 1-2x/week  PT DURATION: 8 weeks (Ending 05/10/2021)  PLANNED INTERVENTIONS: Therapeutic exercises, Therapeutic activity, Neuro Muscular re-education, Gait training, Patient/Family education, Joint mobilization, Dry Needling, Electrical stimulation, Spinal mobilization and/or manipulation, Moist heat, Taping, Vasopneumatic device, Ionotophoresis 4mg /ml Dexamethasone, and Manual therapy  PLAN FOR NEXT SESSION: ***   Shearon Balo PT, DPT 03/15/2021, 1:31 PM

## 2021-03-16 ENCOUNTER — Ambulatory Visit: Payer: BC Managed Care – PPO | Attending: Internal Medicine | Admitting: Physical Therapy

## 2021-03-21 DIAGNOSIS — E291 Testicular hypofunction: Secondary | ICD-10-CM | POA: Diagnosis not present

## 2021-03-28 DIAGNOSIS — E291 Testicular hypofunction: Secondary | ICD-10-CM | POA: Diagnosis not present

## 2021-04-02 DIAGNOSIS — M5416 Radiculopathy, lumbar region: Secondary | ICD-10-CM | POA: Diagnosis not present

## 2021-04-03 DIAGNOSIS — E291 Testicular hypofunction: Secondary | ICD-10-CM | POA: Diagnosis not present

## 2021-05-01 ENCOUNTER — Encounter: Payer: Self-pay | Admitting: Internal Medicine

## 2021-05-01 ENCOUNTER — Other Ambulatory Visit: Payer: Self-pay

## 2021-05-01 ENCOUNTER — Ambulatory Visit (INDEPENDENT_AMBULATORY_CARE_PROVIDER_SITE_OTHER): Payer: BC Managed Care – PPO | Admitting: Internal Medicine

## 2021-05-01 VITALS — BP 126/68 | HR 87 | Resp 18 | Ht 71.0 in | Wt 247.2 lb

## 2021-05-01 DIAGNOSIS — E291 Testicular hypofunction: Secondary | ICD-10-CM

## 2021-05-01 DIAGNOSIS — R7989 Other specified abnormal findings of blood chemistry: Secondary | ICD-10-CM | POA: Diagnosis not present

## 2021-05-01 DIAGNOSIS — G8929 Other chronic pain: Secondary | ICD-10-CM

## 2021-05-01 DIAGNOSIS — M25512 Pain in left shoulder: Secondary | ICD-10-CM | POA: Diagnosis not present

## 2021-05-01 MED ORDER — CYCLOBENZAPRINE HCL 10 MG PO TABS: 10.0000 mg | ORAL_TABLET | Freq: Two times a day (BID) | ORAL | 3 refills | Status: DC | PRN

## 2021-05-01 NOTE — Patient Instructions (Addendum)
You can let us know when you started the testosterone shots.  Stay off the shots for 3-4 weeks before we retest the levels.  Come March 15th or later for the labs for the testosterone levels.  I would recommend to go back to the orthopedic for the shoulder.  We have sent in cyclobenzaprine to use up to twice a day for pain.

## 2021-05-01 NOTE — Progress Notes (Signed)
° °  Subjective:   Patient ID: Grant Miller, male    DOB: 09/02/1975, 46 y.o.   MRN: [id number]  HPI The patient is a 46 YO man coming in for left arm problems.   Review of Systems  Constitutional:  Negative for activity change, appetite change, fatigue, fever and unexpected weight change.  Respiratory: Negative.    Cardiovascular: Negative.   Gastrointestinal:  Positive for abdominal pain.  Musculoskeletal:  Positive for arthralgias, back pain and myalgias.  Skin: Negative.   Neurological:  Negative for syncope, weakness and numbness.   Objective:  Physical Exam Constitutional:      Appearance: He is well-developed.  HENT:     Head: Normocephalic and atraumatic.  Cardiovascular:     Rate and Rhythm: Normal rate and regular rhythm.  Pulmonary:     Effort: Pulmonary effort is normal. No respiratory distress.     Breath sounds: Normal breath sounds. No wheezing or rales.  Abdominal:     General: Bowel sounds are normal. There is no distension.     Palpations: Abdomen is soft.     Tenderness: There is no abdominal tenderness. There is no rebound.     Comments: Scars present  Musculoskeletal:        General: Tenderness present.     Cervical back: Normal range of motion.  Skin:    General: Skin is warm and dry.  Neurological:     Mental Status: He is alert and oriented to person, place, and time.     Coordination: Coordination normal.    Vitals:   05/01/21 1416  BP: 126/68  Pulse: 87  Resp: 18  SpO2: 98%  Weight: 247 lb 3.2 oz (112.1 kg)  Height: 5\' 11"  (1.803 m)    This visit occurred during the SARS-CoV-2 public health emergency.  Safety protocols were in place, including screening questions prior to the visit, additional usage of staff PPE, and extensive cleaning of exam room while observing appropriate contact time as indicated for disinfecting solutions.   Assessment & Plan:

## 2021-05-04 ENCOUNTER — Encounter: Payer: Self-pay | Admitting: Internal Medicine

## 2021-05-04 NOTE — Assessment & Plan Note (Signed)
He has seen specialist and did not return for further evaluation or treatment. He is asking for medication for pain and rx flexeril 10 mg BID prn for pain.  ?

## 2021-05-04 NOTE — Assessment & Plan Note (Signed)
States he has been taking testosterone injections weekly from a clinic in the area. We discussed that previously his testosterone levels with [place] were normal and did not need treatment so if those are normal we would be unable to prescribe testosterone to him. We will have him stop injections and wait 3 weeks and then come check levels prior to 9 am to check levels.  ?

## 2021-05-17 ENCOUNTER — Telehealth: Payer: Self-pay | Admitting: Oncology

## 2021-05-17 NOTE — Telephone Encounter (Signed)
Called patient regarding upcoming 03/17 appointment, spoke with patient's brother regarding his appointment. Patient will be re-notified. ?

## 2021-05-18 ENCOUNTER — Other Ambulatory Visit: Payer: Self-pay

## 2021-05-18 ENCOUNTER — Inpatient Hospital Stay (HOSPITAL_BASED_OUTPATIENT_CLINIC_OR_DEPARTMENT_OTHER): Payer: BC Managed Care – PPO | Admitting: Oncology

## 2021-05-18 ENCOUNTER — Inpatient Hospital Stay: Payer: BC Managed Care – PPO | Attending: Oncology

## 2021-05-18 VITALS — BP 109/53 | HR 76 | Temp 97.8°F | Resp 17 | Ht 71.0 in | Wt 248.2 lb

## 2021-05-18 DIAGNOSIS — D709 Neutropenia, unspecified: Secondary | ICD-10-CM

## 2021-05-18 LAB — CBC WITH DIFFERENTIAL (CANCER CENTER ONLY)
Abs Immature Granulocytes: 0 10*3/uL (ref 0.00–0.07)
Basophils Absolute: 0 10*3/uL (ref 0.0–0.1)
Basophils Relative: 1 %
Eosinophils Absolute: 0.1 10*3/uL (ref 0.0–0.5)
Eosinophils Relative: 3 %
HCT: 47.2 % (ref 39.0–52.0)
Hemoglobin: 15.4 g/dL (ref 13.0–17.0)
Immature Granulocytes: 0 %
Lymphocytes Relative: 49 %
Lymphs Abs: 1.9 10*3/uL (ref 0.7–4.0)
MCH: 27 pg (ref 26.0–34.0)
MCHC: 32.6 g/dL (ref 30.0–36.0)
MCV: 82.7 fL (ref 80.0–100.0)
Monocytes Absolute: 0.3 10*3/uL (ref 0.1–1.0)
Monocytes Relative: 9 %
Neutro Abs: 1.5 10*3/uL — ABNORMAL LOW (ref 1.7–7.7)
Neutrophils Relative %: 38 %
Platelet Count: 235 10*3/uL (ref 150–400)
RBC: 5.71 MIL/uL (ref 4.22–5.81)
RDW: 13.2 % (ref 11.5–15.5)
WBC Count: 3.8 10*3/uL — ABNORMAL LOW (ref 4.0–10.5)
nRBC: 0 % (ref 0.0–0.2)

## 2021-05-18 NOTE — Progress Notes (Signed)
Hematology and Oncology Follow Up Visit ? ?Grant Miller ?[id number] ?12/12/1975 46 y.o. ?05/18/2021 9:12 AM ? ? ?Principle Diagnosis: 46 year old man with neutropenia diagnosed in 2009.  This is related to benign causes at this time and fluctuated close to normal range without intervention.  ? ? ?Current therapy: Active surveillance. ?  ?Interim History: Grant Miller is here for return evaluation.  Since his last visit, he reports feeling well without any major complaints.  He denies any nausea, vomiting or abdominal pain.  He denies any recent hospitalizations or illnesses.  He is currently receiving testosterone replacement without any major complications.  He underwent colonoscopy polyps and overall feels reasonably well. ? ? ? ? ?Medications: Updated on review. ? ?Current Outpatient Medications  ?Medication Sig Dispense Refill  ? anastrozole (ARIMIDEX) 1 MG tablet Take 1 mg by mouth once a week. Saturdays    ? BIOTIN PO Take 1 tablet by mouth daily.    ? Cholecalciferol (D3 PO) Take 1 capsule by mouth daily.    ? cyclobenzaprine (FLEXERIL) 10 MG tablet Take 1 tablet (10 mg total) by mouth 2 (two) times daily as needed for muscle spasms. 60 tablet 3  ? ibuprofen (ADVIL) 800 MG tablet Take 800 mg by mouth 3 (three) times daily as needed.    ? meloxicam (MOBIC) 15 MG tablet Take 1 tablet (15 mg total) by mouth daily. 90 tablet 1  ? Multiple Vitamins-Minerals (ZINC PO) Take 1 tablet by mouth daily.    ? phentermine 37.5 MG capsule Take 37.5 mg by mouth every morning.    ? testosterone cypionate (DEPOTESTOSTERONE CYPIONATE) 200 MG/ML injection Inject 0.8 mLs into the muscle once a week.    ? ?No current facility-administered medications for this visit.  ? ? ?Allergies:  ?Allergies  ?Allergen Reactions  ? Iodine Hives and Shortness Of Breath  ? Other Hives and Shortness Of Breath  ?  All seafood causes this reaction  ? Shellfish Allergy   ?  Other reaction(s): SHORTNESS OF BREATH  ? ? ? ? ? ?Physical Exam: ? ? ? ?Blood  pressure (!) 109/53, pulse 76, temperature 97.8 ?F (36.6 ?C), temperature source Temporal, resp. rate 17, height '5\' 11"'$  (1.803 m), weight 248 lb 3.2 oz (112.6 kg), SpO2 99 %. ? ? ? ? ?ECOG: 0 ? ? ? ?General appearance: Comfortable appearing without any discomfort ?Head: Normocephalic without any trauma ?Oropharynx: Mucous membranes are moist and pink without any thrush or ulcers. ?Eyes: Pupils are equal and round reactive to light. ?Lymph nodes: No cervical, supraclavicular, inguinal or axillary lymphadenopathy.   ?Heart:regular rate and rhythm.  S1 and S2 without leg edema. ?Lung: Clear without any rhonchi or wheezes.  No dullness to percussion. ?Abdomin: Soft, nontender, nondistended with good bowel sounds.  No hepatosplenomegaly. ?Musculoskeletal: No joint deformity or effusion.  Full range of motion noted. ?Neurological: No deficits noted on motor, sensory and deep tendon reflex exam. ?Skin: No petechial rash or dryness.  Appeared moist.  ? ? ? ? ? ? ? ? ?Lab Results: ?Lab Results  ?Component Value Date  ? WBC 4.3 11/17/2020  ? HGB 15.2 11/17/2020  ? HCT 46.1 11/17/2020  ? MCV 82.9 11/17/2020  ? PLT 240 11/17/2020  ? ?  Chemistry   ?   ?Component Value Date/Time  ? NA 141 08/17/2020 0948  ? NA 142 08/16/2014 0759  ? K 4.1 08/17/2020 0948  ? K 3.8 08/16/2014 0759  ? CL 106 08/17/2020 0948  ? CO2 28 08/17/2020 0948  ?  CO2 22 08/16/2014 0759  ? BUN 12 08/17/2020 0948  ? BUN 19.6 08/16/2014 0759  ? CREATININE 1.13 08/17/2020 0948  ? CREATININE 1.20 03/28/2020 1211  ? CREATININE 1.2 08/16/2014 0759  ?    ?Component Value Date/Time  ? CALCIUM 9.5 08/17/2020 0948  ? CALCIUM 9.2 08/16/2014 0759  ? ALKPHOS 45 08/17/2020 0948  ? ALKPHOS 62 08/16/2014 0759  ? AST 40 (H) 08/17/2020 0948  ? AST 38 03/28/2020 1211  ? AST 35 (H) 08/16/2014 0759  ? ALT 39 08/17/2020 0948  ? ALT 43 03/28/2020 1211  ? ALT 36 08/16/2014 0759  ? BILITOT 0.8 08/17/2020 0948  ? BILITOT 0.6 03/28/2020 1211  ? BILITOT 0.43 08/16/2014 0759  ?   ? ? ? ?Impression and Plan: ? ? 46 year old man with:  ? ?1.  Neutropenia diagnosed in 2009.  Differential diagnosis includes benign causes with fluctuation back to normal range since that time.   ? ?Laboratory data in the last year were reviewed and showed a white cell count in the range between 3.6 and 4.3 with normal differential.  Management choices were discussed at this time and I recommended continued active surveillance. ? ?CBC from today was reviewed and showed a white cell count of 3.8 with a differential is normal with normal hemoglobin and platelets. ? ?2. Health maintenance issues.  He is up-to-date and I recommended to continue following with his primary care physician.  He is up-to-date on his colonoscopy that was recently completed. ? ?3. Follow-up: In 6 months for repeat evaluation. ? ?20 minutes were dedicated to this visit.  The time was spent on reviewing laboratory data, disease status update and future plan of care discussion. ? ? ? ?Zola Button, MD ?3/17/20239:12 AM  ?

## 2021-05-23 ENCOUNTER — Other Ambulatory Visit: Payer: Self-pay

## 2021-05-23 ENCOUNTER — Other Ambulatory Visit: Payer: Medicare Other

## 2021-05-23 DIAGNOSIS — R7989 Other specified abnormal findings of blood chemistry: Secondary | ICD-10-CM

## 2021-05-24 LAB — TESTOSTERONE TOTAL,FREE,BIO, MALES
Albumin: 4.7 g/dL (ref 3.6–5.1)
Sex Hormone Binding: 13 nmol/L (ref 10–50)
Testosterone: 156 ng/dL — ABNORMAL LOW (ref 250–827)

## 2021-05-29 ENCOUNTER — Telehealth: Payer: Self-pay | Admitting: Internal Medicine

## 2021-05-29 DIAGNOSIS — N529 Male erectile dysfunction, unspecified: Secondary | ICD-10-CM

## 2021-05-29 NOTE — Telephone Encounter (Signed)
Pt checking status of 05-23-2021 lab results ? ?Informed pt of provider's 05-25-2021 result note and recommendation ? ?Pt verbalized understanding and requesting a lab order to recheck testosterone level as provider recommended  ? ? ?

## 2021-06-04 ENCOUNTER — Telehealth: Payer: Self-pay

## 2021-06-04 ENCOUNTER — Ambulatory Visit: Payer: Medicare Other

## 2021-06-04 NOTE — Telephone Encounter (Signed)
Called patient x 3 rolls to vm unable to leave a message . ? ?L.Yeraldi Fidler,LPN  ?

## 2021-06-05 NOTE — Telephone Encounter (Signed)
Pt states that he tried calling you about and was wondering if you could give him a call back.  ? ? ?

## 2021-06-06 ENCOUNTER — Other Ambulatory Visit: Payer: Medicare Other

## 2021-06-06 DIAGNOSIS — N529 Male erectile dysfunction, unspecified: Secondary | ICD-10-CM

## 2021-06-07 LAB — TESTOSTERONE TOTAL,FREE,BIO, MALES
Albumin: 4.7 g/dL (ref 3.6–5.1)
Sex Hormone Binding: 13 nmol/L (ref 10–50)
Testosterone: 76 ng/dL — ABNORMAL LOW (ref 250–827)

## 2021-06-18 ENCOUNTER — Ambulatory Visit (INDEPENDENT_AMBULATORY_CARE_PROVIDER_SITE_OTHER): Payer: BC Managed Care – PPO | Admitting: Internal Medicine

## 2021-06-18 ENCOUNTER — Telehealth: Payer: Self-pay | Admitting: Internal Medicine

## 2021-06-18 ENCOUNTER — Encounter: Payer: Self-pay | Admitting: Internal Medicine

## 2021-06-18 DIAGNOSIS — G8929 Other chronic pain: Secondary | ICD-10-CM

## 2021-06-18 DIAGNOSIS — M545 Low back pain, unspecified: Secondary | ICD-10-CM

## 2021-06-18 DIAGNOSIS — E291 Testicular hypofunction: Secondary | ICD-10-CM | POA: Diagnosis not present

## 2021-06-18 DIAGNOSIS — M25512 Pain in left shoulder: Secondary | ICD-10-CM

## 2021-06-18 MED ORDER — CYCLOBENZAPRINE HCL 10 MG PO TABS: 10.0000 mg | ORAL_TABLET | Freq: Two times a day (BID) | ORAL | 3 refills | Status: DC | PRN

## 2021-06-18 MED ORDER — "SYRINGE 20G X 1"" 3 ML MISC": 0 refills | Status: DC

## 2021-06-18 MED ORDER — MELOXICAM 15 MG PO TABS: 15.0000 mg | ORAL_TABLET | Freq: Every day | ORAL | 1 refills | Status: DC

## 2021-06-18 MED ORDER — TESTOSTERONE CYPIONATE 200 MG/ML IM SOLN: 100.0000 mg | INTRAMUSCULAR | 1 refills | Status: DC

## 2021-06-18 MED ORDER — DULOXETINE HCL 30 MG PO CPEP: 30.0000 mg | ORAL_CAPSULE | Freq: Every day | ORAL | 3 refills | Status: DC

## 2021-06-18 NOTE — Patient Instructions (Addendum)
We have sent in the testosterone to do 100 mg  (1 mL) every week and we will recheck the levels in about 2-3 months. ? ?We have sent in duloxetine (cymbalta) which can help with depression and pain sometimes. ? ?The cymbalta may help with focus and energy as well.  ?

## 2021-06-18 NOTE — Progress Notes (Signed)
? ?  Subjective:  ? ?Patient ID: Grant Miller, male    DOB: Aug 05, 1975, 46 y.o.   MRN: [id number] ? ?HPI ?The patient is a 46 YO man coming in for several concerns.  ? ?Review of Systems  ?Constitutional:  Positive for activity change and fatigue.  ?HENT: Negative.    ?Eyes: Negative.   ?Respiratory:  Negative for cough, chest tightness and shortness of breath.   ?Cardiovascular:  Negative for chest pain, palpitations and leg swelling.  ?Gastrointestinal:  Negative for abdominal distention, abdominal pain, constipation, diarrhea, nausea and vomiting.  ?Musculoskeletal:  Positive for arthralgias and myalgias.  ?Skin: Negative.   ?Neurological: Negative.   ?Psychiatric/Behavioral: Negative.    ? ?Objective:  ?Physical Exam ?Constitutional:   ?   Appearance: He is well-developed.  ?HENT:  ?   Head: Normocephalic and atraumatic.  ?Cardiovascular:  ?   Rate and Rhythm: Normal rate and regular rhythm.  ?Pulmonary:  ?   Effort: Pulmonary effort is normal. No respiratory distress.  ?   Breath sounds: Normal breath sounds. No wheezing or rales.  ?Abdominal:  ?   General: Bowel sounds are normal. There is no distension.  ?   Palpations: Abdomen is soft.  ?   Tenderness: There is abdominal tenderness. There is no rebound.  ?   Comments: scar  ?Musculoskeletal:     ?   General: Tenderness present.  ?   Cervical back: Normal range of motion.  ?Skin: ?   General: Skin is warm and dry.  ?Neurological:  ?   Mental Status: He is alert and oriented to person, place, and time.  ?   Coordination: Coordination normal.  ? ? ?Vitals:  ? 06/18/21 1012  ?BP: 124/70  ?Pulse: 74  ?Resp: 18  ?SpO2: 97%  ?Weight: 236 lb 9.6 oz (107.3 kg)  ?Height: '5\' 11"'$  (1.803 m)  ? ? ?This visit occurred during the SARS-CoV-2 public health emergency.  Safety protocols were in place, including screening questions prior to the visit, additional usage of staff PPE, and extensive cleaning of exam room while observing appropriate contact time as indicated for  disinfecting solutions.  ? ?Assessment & Plan:  ? ?

## 2021-06-18 NOTE — Telephone Encounter (Signed)
Pt states pharmacy informed him a PA is needed for testosterone cypionate (DEPOTESTOSTERONE CYPIONATE) 200 MG/ML injection ? ?Pt is requesting a PA ?

## 2021-06-20 NOTE — Telephone Encounter (Signed)
Prior authorization has been initiated on covermymeds ?Key: [id number] ?PA Case ID: [id number] ?Rx #: [id number] ? ?Waiting for insurance to make a decision.  ?

## 2021-06-22 ENCOUNTER — Encounter: Payer: Self-pay | Admitting: Internal Medicine

## 2021-06-22 NOTE — Assessment & Plan Note (Signed)
Has benefited from flexeril 10 mg BID and refilled today. He is encouraged to use tylenol as well otc and rx for meloxicam 15 mg daily done today as well.  ?

## 2021-06-22 NOTE — Assessment & Plan Note (Signed)
Levels reviewed with patient. Side effects/benefits of testosterone therapy reviewed. Rx testosterone IM 100 mg every 7 days and will need levels assessed in 2-3 months.  ?

## 2021-06-22 NOTE — Assessment & Plan Note (Signed)
Rx for meloxicam 15 mg daily, cymbalta 30 mg daily and flexeril 10 mg BID prn for pain. He is asked to return to his back specialist to help long term with management.  ?

## 2021-06-26 NOTE — Telephone Encounter (Signed)
Pt checking status of PA, informed pt per [facility] PA was denied but an appeal will be submitted ? ?Pt requesting a cb w/ status update once appeal is submitted ?

## 2021-06-26 NOTE — Telephone Encounter (Signed)
Noted  

## 2021-07-09 NOTE — Telephone Encounter (Signed)
R/S for 07/10/21 ?

## 2021-07-10 ENCOUNTER — Ambulatory Visit (INDEPENDENT_AMBULATORY_CARE_PROVIDER_SITE_OTHER): Payer: BC Managed Care – PPO

## 2021-07-10 ENCOUNTER — Telehealth: Payer: Self-pay

## 2021-07-10 DIAGNOSIS — Z0000 Encounter for general adult medical examination without abnormal findings: Secondary | ICD-10-CM

## 2021-07-10 NOTE — Patient Instructions (Signed)
Grant Miller , ?Thank you for taking time to come for your Medicare Wellness Visit. I appreciate your ongoing commitment to your health goals. Please review the following plan we discussed and let me know if I can assist you in the future.  ? ?Screening recommendations/referrals: ?Colonoscopy: 01/17/2021; due every 7 years ?Recommended yearly ophthalmology/optometry visit for glaucoma screening and checkup ?Recommended yearly dental visit for hygiene and checkup ? ?Vaccinations: ?Influenza vaccine: declined ?Pneumococcal vaccine: declined ?Tdap vaccine: declined ?Shingles vaccine: have to be age 48 and above   ?Covid-19: 06/28/2019, 07/19/2019, 02/23/2020 ? ?Advanced directives: Yes; Please bring a copy of your health care power of attorney and living will to the office at your convenience. ? ?Conditions/risks identified: Yes ? ?Next appointment: Please schedule your next Medicare Wellness Visit with your Nurse Health Advisor in 1 year by calling [phone]. ? ?Preventive Care 40-64 Years, Male ?Preventive care refers to lifestyle choices and visits with your health care provider that can promote health and wellness. ?What does preventive care include? ?A yearly physical exam. This is also called an annual well check. ?Dental exams once or twice a year. ?Routine eye exams. Ask your health care provider how often you should have your eyes checked. ?Personal lifestyle choices, including: ?Daily care of your teeth and gums. ?Regular physical activity. ?Eating a healthy diet. ?Avoiding tobacco and drug use. ?Limiting alcohol use. ?Practicing safe sex. ?Taking low-dose aspirin every day starting at age 78. ?What happens during an annual well check? ?The services and screenings done by your health care provider during your annual well check will depend on your age, overall health, lifestyle risk factors, and family history of disease. ?Counseling  ?Your health care provider may ask you questions about your: ?Alcohol  use. ?Tobacco use. ?Drug use. ?Emotional well-being. ?Home and relationship well-being. ?Sexual activity. ?Eating habits. ?Work and work Statistician. ?Screening  ?You may have the following tests or measurements: ?Height, weight, and BMI. ?Blood pressure. ?Lipid and cholesterol levels. These may be checked every 5 years, or more frequently if you are over 50 years old. ?Skin check. ?Lung cancer screening. You may have this screening every year starting at age 63 if you have a 30-pack-year history of smoking and currently smoke or have quit within the past 15 years. ?Fecal occult blood test (FOBT) of the stool. You may have this test every year starting at age 33. ?Flexible sigmoidoscopy or colonoscopy. You may have a sigmoidoscopy every 5 years or a colonoscopy every 10 years starting at age 51. ?Prostate cancer screening. Recommendations will vary depending on your family history and other risks. ?Hepatitis C blood test. ?Hepatitis B blood test. ?Sexually transmitted disease (STD) testing. ?Diabetes screening. This is done by checking your blood sugar (glucose) after you have not eaten for a while (fasting). You may have this done every 1-3 years. ?Discuss your test results, treatment options, and if necessary, the need for more tests with your health care provider. ?Vaccines  ?Your health care provider may recommend certain vaccines, such as: ?Influenza vaccine. This is recommended every year. ?Tetanus, diphtheria, and acellular pertussis (Tdap, Td) vaccine. You may need a Td booster every 10 years. ?Zoster vaccine. You may need this after age 17. ?Pneumococcal 13-valent conjugate (PCV13) vaccine. You may need this if you have certain conditions and have not been vaccinated. ?Pneumococcal polysaccharide (PPSV23) vaccine. You may need one or two doses if you smoke cigarettes or if you have certain conditions. ?Talk to your health care provider about which screenings and  vaccines you need and how often you need  them. ?This information is not intended to replace advice given to you by your health care provider. Make sure you discuss any questions you have with your health care provider. ?Document Released: 03/17/2015 Document Revised: 11/08/2015 Document Reviewed: 12/20/2014 ?Elsevier Interactive Patient Education ? 2017 [place]. ? ?Fall Prevention in the Home ?Falls can cause injuries. They can happen to people of all ages. There are many things you can do to make your home safe and to help prevent falls. ?What can I do on the outside of my home? ?Regularly fix the edges of walkways and driveways and fix any cracks. ?Remove anything that might make you trip as you walk through a door, such as a raised step or threshold. ?Trim any bushes or trees on the path to your home. ?Use bright outdoor lighting. ?Clear any walking paths of anything that might make someone trip, such as rocks or tools. ?Regularly check to see if handrails are loose or broken. Make sure that both sides of any steps have handrails. ?Any raised decks and porches should have guardrails on the edges. ?Have any leaves, snow, or ice cleared regularly. ?Use sand or salt on walking paths during winter. ?Clean up any spills in your garage right away. This includes oil or grease spills. ?What can I do in the bathroom? ?Use night lights. ?Install grab bars by the toilet and in the tub and shower. Do not use towel bars as grab bars. ?Use non-skid mats or decals in the tub or shower. ?If you need to sit down in the shower, use a plastic, non-slip stool. ?Keep the floor dry. Clean up any water that spills on the floor as soon as it happens. ?Remove soap buildup in the tub or shower regularly. ?Attach bath mats securely with double-sided non-slip rug tape. ?Do not have throw rugs and other things on the floor that can make you trip. ?What can I do in the bedroom? ?Use night lights. ?Make sure that you have a light by your bed that is easy to reach. ?Do not use  any sheets or blankets that are too big for your bed. They should not hang down onto the floor. ?Have a firm chair that has side arms. You can use this for support while you get dressed. ?Do not have throw rugs and other things on the floor that can make you trip. ?What can I do in the kitchen? ?Clean up any spills right away. ?Avoid walking on wet floors. ?Keep items that you use a lot in easy-to-reach places. ?If you need to reach something above you, use a strong step stool that has a grab bar. ?Keep electrical cords out of the way. ?Do not use floor polish or wax that makes floors slippery. If you must use wax, use non-skid floor wax. ?Do not have throw rugs and other things on the floor that can make you trip. ?What can I do with my stairs? ?Do not leave any items on the stairs. ?Make sure that there are handrails on both sides of the stairs and use them. Fix handrails that are broken or loose. Make sure that handrails are as long as the stairways. ?Check any carpeting to make sure that it is firmly attached to the stairs. Fix any carpet that is loose or worn. ?Avoid having throw rugs at the top or bottom of the stairs. If you do have throw rugs, attach them to the floor with carpet tape. ?Make  sure that you have a light switch at the top of the stairs and the bottom of the stairs. If you do not have them, ask someone to add them for you. ?What else can I do to help prevent falls? ?Wear shoes that: ?Do not have high heels. ?Have rubber bottoms. ?Are comfortable and fit you well. ?Are closed at the toe. Do not wear sandals. ?If you use a stepladder: ?Make sure that it is fully opened. Do not climb a closed stepladder. ?Make sure that both sides of the stepladder are locked into place. ?Ask someone to hold it for you, if possible. ?Clearly mark and make sure that you can see: ?Any grab bars or handrails. ?First and last steps. ?Where the edge of each step is. ?Use tools that help you move around (mobility aids)  if they are needed. These include: ?Canes. ?Walkers. ?Scooters. ?Crutches. ?Turn on the lights when you go into a dark area. Replace any light bulbs as soon as they burn out. ?Set up your furniture so you have a cle

## 2021-07-10 NOTE — Telephone Encounter (Signed)
Medication advise.  Please contact patient. ?

## 2021-07-10 NOTE — Telephone Encounter (Signed)
I don't see any question here? ?

## 2021-07-10 NOTE — Progress Notes (Signed)
?I connected with Grant Miller today by telephone and verified that I am speaking with the correct person using two identifiers. ?Location patient: home ?Location provider: work ?Persons participating in the virtual visit: patient, provider. ?  ?I discussed the limitations, risks, security and privacy concerns of performing an evaluation and management service by telephone and the availability of in person appointments. I also discussed with the patient that there may be a patient responsible charge related to this service. The patient expressed understanding and verbally consented to this telephonic visit.  ?  ?Interactive audio and video telecommunications were attempted between this provider and patient, however failed, due to patient having technical difficulties OR patient did not have access to video capability.  We continued and completed visit with audio only. ? ?Some vital signs may be absent or patient reported.  ? ?Time Spent with patient on telephone encounter: 30 minutes ? ?Subjective:  ? Grant Miller is a 46 y.o. male who presents for Medicare Annual/Subsequent preventive examination. ? ?Review of Systems    ? ?Cardiac Risk Factors include: family history of premature cardiovascular disease;male gender;obesity (BMI >30kg/m2) (Physical therapy exercises) ? ?   ?Objective:  ?  ?There were no vitals filed for this visit. ?There is no height or weight on file to calculate BMI. ? ? ?  07/10/2021  ?  4:10 PM 11/28/2020  ? 12:25 PM 08/30/2020  ? 11:36 AM 02/11/2018  ?  8:49 AM 04/16/2017  ?  9:36 PM 02/16/2016  ?  8:21 AM 09/14/2015  ?  8:23 AM  ?Advanced Directives  ?Does Patient Have a Medical Advance Directive? Yes No No No No No No  ?Type of Advance Directive Living will;Healthcare Power of Attorney        ?Copy of [place] in Chart? No - copy requested        ?Would patient like information on creating a medical advance directive?   No - Patient declined No - Patient declined No -  Patient declined Yes (MAU/Ambulatory/Procedural Areas - Information given) Yes - Educational materials given  ? ? ?Current Medications (verified) ?Outpatient Encounter Medications as of 07/10/2021  ?Medication Sig  ? anastrozole (ARIMIDEX) 1 MG tablet Take 1 mg by mouth once a week. Saturdays  ? BIOTIN PO Take 1 tablet by mouth daily.  ? Cholecalciferol (D3 PO) Take 1 capsule by mouth daily.  ? cyclobenzaprine (FLEXERIL) 10 MG tablet Take 1 tablet (10 mg total) by mouth 2 (two) times daily as needed for muscle spasms.  ? DULoxetine (CYMBALTA) 30 MG capsule Take 1 capsule (30 mg total) by mouth daily.  ? meloxicam (MOBIC) 15 MG tablet Take 1 tablet (15 mg total) by mouth daily.  ? Multiple Vitamins-Minerals (ZINC PO) Take 1 tablet by mouth daily.  ? Syringe/Needle, Disp, (SYRINGE 3CC/20GX1") 20G X 1" 3 ML MISC Use weekly with testosterone  ? testosterone cypionate (DEPOTESTOSTERONE CYPIONATE) 200 MG/ML injection Inject 0.5 mLs (100 mg total) into the muscle every 7 (seven) days.  ? ?No facility-administered encounter medications on file as of 07/10/2021.  ? ? ?Allergies (verified) ?Iodine, Other, and Shellfish allergy  ? ?History: ?Past Medical History:  ?Diagnosis Date  ? Allergy   ? Anxiety   ? Arthritis   ? Depression   ? ?Past Surgical History:  ?Procedure Laterality Date  ? ABDOMINAL SURGERY    ? right wrist surgery    ? tumor on left leg Left   ? ?Family History  ?Problem Relation Age of Onset  ?  Heart disease Mother   ? Hyperlipidemia Mother   ? Kidney disease Mother   ? Diabetes Mother   ? Colon cancer Neg Hx   ? Esophageal cancer Neg Hx   ? Rectal cancer Neg Hx   ? Stomach cancer Neg Hx   ? ?Social History  ? ?Socioeconomic History  ? Marital status: Single  ?  Spouse name: Not on file  ? Number of children: Not on file  ? Years of education: Not on file  ? Highest education level: Not on file  ?Occupational History  ? Not on file  ?Tobacco Use  ? Smoking status: Former  ?  Types: Cigarettes  ?  Quit date: 2003   ?  Years since quitting: 20.3  ? Smokeless tobacco: Never  ?Vaping Use  ? Vaping Use: Never used  ?Substance and Sexual Activity  ? Alcohol use: No  ? Drug use: No  ? Sexual activity: Not on file  ?Other Topics Concern  ? Not on file  ?Social History Narrative  ? Not on file  ? ?Social Determinants of Health  ? ?Financial Resource Strain: Low Risk   ? Difficulty of Paying Living Expenses: Not hard at all  ?Food Insecurity: No Food Insecurity  ? Worried About Charity fundraiser in the Last Year: Never true  ? Ran Out of Food in the Last Year: Never true  ?Transportation Needs: No Transportation Needs  ? Lack of Transportation (Medical): No  ? Lack of Transportation (Non-Medical): No  ?Physical Activity: Sufficiently Active  ? Days of Exercise per Week: 3 days  ? Minutes of Exercise per Session: 50 min  ?Stress: No Stress Concern Present  ? Feeling of Stress : Not at all  ?Social Connections: Moderately Integrated  ? Frequency of Communication with Friends and Family: More than three times a week  ? Frequency of Social Gatherings with Friends and Family: More than three times a week  ? Attends Religious Services: 1 to 4 times per year  ? Active Member of Clubs or Organizations: No  ? Attends Archivist Meetings: 1 to 4 times per year  ? Marital Status: Never married  ? ? ?Tobacco Counseling ?Counseling given: Not Answered ? ? ?Clinical Intake: ? ?Pre-visit preparation completed: Yes ? ?Pain : 0-10 ?Pain Type: Chronic pain ?Pain Location: Back (left shoulder and abdomen) ?Pain Orientation: Lower ?Pain Descriptors / Indicators: Constant ?Pain Onset: More than a month ago ?Pain Frequency: Constant ?Pain Relieving Factors: Steroid injections, Meloxicam and Aleve ?Effect of Pain on Daily Activities: Pain can diminish job performance, lower motivation to exercise, and prevent you from completing daily tasks. Pain produces disability and affects the quality of life. ? ?Pain Relieving Factors: Steroid injections,  Meloxicam and Aleve ? ?Nutritional Risks: None ?Diabetes: No ? ?How often do you need to have someone help you when you read instructions, pamphlets, or other written materials from your doctor or pharmacy?: 1 - Never ?What is the last grade level you completed in school?: 9th grade ? ?Diabetic: no ? ?Interpreter Needed?: No ? ?Information entered by :: Lisette Abu, LPN. ? ? ?Activities of Daily Living ? ?  07/10/2021  ?  4:12 PM  ?In your present state of health, do you have any difficulty performing the following activities:  ?Hearing? 0  ?Vision? 0  ?Difficulty concentrating or making decisions? 1  ?Walking or climbing stairs? 0  ?Dressing or bathing? 0  ?Doing errands, shopping? 0  ?Preparing Food and eating ? N  ?  Using the Toilet? N  ?In the past six months, have you accidently leaked urine? N  ?Do you have problems with loss of bowel control? N  ?Managing your Medications? N  ?Managing your Finances? N  ?Housekeeping or managing your Housekeeping? N  ? ? ?Patient Care Team: ?Hoyt Koch, MD as PCP - General (Internal Medicine) ? ?Indicate any recent Medical Services you may have received from other than [facility] providers in the past year (date may be approximate). ? ?   ?Assessment:  ? This is a routine wellness examination for Grant Miller. ? ?Hearing/Vision screen ?Hearing Screening - Comments:: Patient denied any hearing difficulty.   ?No hearing aids. ? ?Vision Screening - Comments:: Patient does wear corrective lenses/contacts.  ?Eye exam done by: [facility] at [facility] ? ? ?Dietary issues and exercise activities discussed: ?Current Exercise Habits: Home exercise routine, Type of exercise: walking, Time (Minutes): 50, Frequency (Times/Week): 3, Weekly Exercise (Minutes/Week): 150, Intensity: Moderate, Exercise limited by: orthopedic condition(s) ? ? Goals Addressed   ? ?  ?  ?  ?  ? This Visit's Progress  ?  To join the gym and get more physically active.     ? ?  ?Depression Screen ? ?  07/10/2021   ?  4:05 PM 08/17/2020  ?  4:03 PM 04/30/2019  ?  8:25 AM 02/04/2014  ?  9:57 AM  ?PHQ 2/9 Scores  ?PHQ - 2 Score 4 2 0 0  ?PHQ- 9 Score 14 12    ?  ?Fall Risk ? ?  07/10/2021  ?  4:12 PM 08/17/2020  ?  9:28 AM 12

## 2021-07-27 ENCOUNTER — Ambulatory Visit: Payer: Medicare Other | Admitting: Sports Medicine

## 2021-08-02 NOTE — Progress Notes (Unsigned)
    Grant Miller D.[place] Phone: [phone]   Assessment and Plan:     There are no diagnoses linked to this encounter.  ***   Pertinent previous records reviewed include ***   Follow Up: ***     Subjective:   I, Grant Miller, am serving as a Education administrator for Doctor Glennon Mac   Chief Complaint: right groin pain    HPI:    01/10/21 Patient is a 46 year old male presenting with persistent right groin pain X2-3 months with sharp pain, but has been there since January. Patient was last seen by his PCP on 01/02/21 for this reason and was given a toradol injection to help with pain and a hip x ray. Patient was referred to sports medicine for diagnosis and treatment. Today patient states that he had started some stretches to help with the pain and has helped with the sharp pain. Pain will take ibuprofen '800mg'$  once daily to help with pain, patient is on light duty for work for the moment, Ice twice a day. 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      02/07/2021 Patient states that flares up here and there but the injection did help. Scheduled PT for January. Notices when he is laying on his back and rotates his leg out he has pain. Hasn't been using it since he is on light duty at work    08/03/2021 Patient states   Relevant Historical Information: ***  Additional pertinent review of systems negative.   Current Outpatient Medications:    anastrozole (ARIMIDEX) 1 MG tablet, Take 1 mg by mouth once a week. Saturdays, Disp: , Rfl:    BIOTIN PO, Take 1 tablet by mouth daily., Disp: , Rfl:    Cholecalciferol (D3 PO), Take 1 capsule by mouth daily., Disp: , Rfl:    cyclobenzaprine (FLEXERIL) 10 MG tablet, Take 1 tablet (10 mg total) by mouth 2 (two) times daily as needed for muscle spasms., Disp: 60 tablet, Rfl: 3   DULoxetine (CYMBALTA) 30 MG capsule, Take 1 capsule (30 mg  total) by mouth daily., Disp: 30 capsule, Rfl: 3   meloxicam (MOBIC) 15 MG tablet, Take 1 tablet (15 mg total) by mouth daily., Disp: 90 tablet, Rfl: 1   Multiple Vitamins-Minerals (ZINC PO), Take 1 tablet by mouth daily., Disp: , Rfl:    Syringe/Needle, Disp, (SYRINGE 3CC/20GX1") 20G X 1" 3 ML MISC, Use weekly with testosterone, Disp: 50 each, Rfl: 0   testosterone cypionate (DEPOTESTOSTERONE CYPIONATE) 200 MG/ML injection, Inject 0.5 mLs (100 mg total) into the muscle every 7 (seven) days., Disp: 10 mL, Rfl: 1   Objective:     There were no vitals filed for this visit.    There is no height or weight on file to calculate BMI.    Physical Exam:    ***   Electronically signed by:  Grant Miller D.[facility] 7:58 AM 08/02/21

## 2021-08-03 ENCOUNTER — Ambulatory Visit: Payer: Self-pay

## 2021-08-03 ENCOUNTER — Ambulatory Visit (INDEPENDENT_AMBULATORY_CARE_PROVIDER_SITE_OTHER): Payer: 59 | Admitting: Sports Medicine

## 2021-08-03 VITALS — BP 120/78 | HR 153 | Ht 71.0 in | Wt 245.0 lb

## 2021-08-03 DIAGNOSIS — M1611 Unilateral primary osteoarthritis, right hip: Secondary | ICD-10-CM

## 2021-08-03 DIAGNOSIS — M25551 Pain in right hip: Secondary | ICD-10-CM

## 2021-08-03 NOTE — Patient Instructions (Addendum)
Good to see you  Tylenol 500 mg as needed for pain relief  Follow up in 2-3 weeks if no improvement or worsening of symptoms

## 2021-08-13 ENCOUNTER — Telehealth: Payer: Self-pay | Admitting: Internal Medicine

## 2021-08-13 NOTE — Telephone Encounter (Signed)
That is appropriate dosing for testosterone. Should not be increased.

## 2021-08-13 NOTE — Telephone Encounter (Signed)
Patient needs 1.0 instead of .5 for his testosterone medication.  Patient also wants his viagra put back on his medication list.  Patient said that Dr. Sharlet Salina had also put him on a medication that was 37.5 mg but he does not remember what it was.  Please call patient

## 2021-08-14 NOTE — Telephone Encounter (Signed)
Called pt. LDVM with Dr. Nathanial Millman recommendations. Office number was provided

## 2021-09-12 NOTE — Telephone Encounter (Signed)
How long has he been back on injections? If at least 4 weeks we can check a morning level and see if this dosing is right. There are side effects from too high doses of testosterone so without levels I would not be comfortable adujsting the dose.

## 2021-09-12 NOTE — Telephone Encounter (Signed)
Pt called in again regarding this message. States the 0.5 on testosterone is not working and he needs 1L.    Requesting call back from assistant.

## 2021-09-18 NOTE — Telephone Encounter (Signed)
Pt has been scheduled for 09/20/2021 to discuss his concerns.

## 2021-09-20 ENCOUNTER — Ambulatory Visit (INDEPENDENT_AMBULATORY_CARE_PROVIDER_SITE_OTHER): Payer: 59 | Admitting: Internal Medicine

## 2021-09-20 ENCOUNTER — Encounter: Payer: Self-pay | Admitting: Internal Medicine

## 2021-09-20 VITALS — BP 116/72 | HR 88 | Resp 18 | Ht 71.0 in | Wt 230.8 lb

## 2021-09-20 DIAGNOSIS — Z9889 Other specified postprocedural states: Secondary | ICD-10-CM

## 2021-09-20 DIAGNOSIS — E66811 Obesity, class 1: Secondary | ICD-10-CM

## 2021-09-20 DIAGNOSIS — E291 Testicular hypofunction: Secondary | ICD-10-CM

## 2021-09-20 DIAGNOSIS — E669 Obesity, unspecified: Secondary | ICD-10-CM

## 2021-09-20 DIAGNOSIS — N529 Male erectile dysfunction, unspecified: Secondary | ICD-10-CM

## 2021-09-20 DIAGNOSIS — R7301 Impaired fasting glucose: Secondary | ICD-10-CM | POA: Diagnosis not present

## 2021-09-20 LAB — COMPREHENSIVE METABOLIC PANEL
ALT: 28 U/L (ref 0–53)
AST: 31 U/L (ref 0–37)
Albumin: 5 g/dL (ref 3.5–5.2)
Alkaline Phosphatase: 33 U/L — ABNORMAL LOW (ref 39–117)
BUN: 20 mg/dL (ref 6–23)
CO2: 24 mEq/L (ref 19–32)
Calcium: 9.9 mg/dL (ref 8.4–10.5)
Chloride: 103 mEq/L (ref 96–112)
Creatinine, Ser: 1.26 mg/dL (ref 0.40–1.50)
GFR: 67.82 mL/min (ref >60.00)
Glucose, Bld: 91 mg/dL (ref 70–99)
Potassium: 4 mEq/L (ref 3.5–5.1)
Sodium: 138 mEq/L (ref 135–145)
Total Bilirubin: 0.8 mg/dL (ref 0.2–1.2)
Total Protein: 7.8 g/dL (ref 6.0–8.3)

## 2021-09-20 LAB — CBC
HCT: 48.7 % (ref 39.0–52.0)
Hemoglobin: 15.9 g/dL (ref 13.0–17.0)
MCHC: 32.7 g/dL (ref 30.0–36.0)
MCV: 85.7 fl (ref 78.0–100.0)
Platelets: 217 10*3/uL (ref 150.0–400.0)
RBC: 5.69 Mil/uL (ref 4.22–5.81)
RDW: 14 % (ref 11.5–15.5)
WBC: 3.3 10*3/uL — ABNORMAL LOW (ref 4.0–10.5)

## 2021-09-20 MED ORDER — METHOCARBAMOL 500 MG PO TABS: 500.0000 mg | ORAL_TABLET | Freq: Three times a day (TID) | ORAL | 3 refills | Status: DC | PRN

## 2021-09-20 MED ORDER — PHENTERMINE HCL 37.5 MG PO TABS: 37.5000 mg | ORAL_TABLET | Freq: Every day | ORAL | 0 refills | Status: DC

## 2021-09-20 MED ORDER — SILDENAFIL CITRATE 100 MG PO TABS: 50.0000 mg | ORAL_TABLET | Freq: Every day | ORAL | 11 refills | Status: AC | PRN

## 2021-09-20 NOTE — Assessment & Plan Note (Signed)
Rx phentermine 30 day supply 37.5 mg daily. Advised that this is not a long term option and this will be only rx for this and he understands.

## 2021-09-20 NOTE — Assessment & Plan Note (Signed)
Checking testosterone free levels on his current 100 mg weekly testosterone injections. Checking CBC for complications. Adjust dosing as needed.

## 2021-09-20 NOTE — Assessment & Plan Note (Signed)
Rx viagra to use 50-100 mg daily prn.

## 2021-09-20 NOTE — Assessment & Plan Note (Signed)
With persistent pain and rx methocarbamol to replace cyclobenzaprine. Rx 500 mg methocarbamol TID prn.

## 2021-09-20 NOTE — Assessment & Plan Note (Signed)
Offered HgA1c testing today and he prefers to wait until next visit as he has had dietary changes lately and wants to wait until this is accurate.

## 2021-09-20 NOTE — Patient Instructions (Signed)
We have sent in the phentermine and the methocarbamol.   We are checking the levels of the testosterone and adjust the dose if needed.

## 2021-09-20 NOTE — Progress Notes (Signed)
   Subjective:   Patient ID: Grant Miller, male    DOB: [birth year], 46 y.o.   MRN: [id number]  HPI The patient is a 46 YO man coming in for concerns/follow up.  Review of Systems  Constitutional:  Positive for fatigue.  HENT: Negative.    Eyes: Negative.   Respiratory:  Negative for cough, chest tightness and shortness of breath.   Cardiovascular:  Negative for chest pain, palpitations and leg swelling.  Gastrointestinal:  Positive for abdominal pain. Negative for abdominal distention, constipation, diarrhea, nausea and vomiting.  Musculoskeletal: Negative.   Skin: Negative.   Neurological: Negative.   Psychiatric/Behavioral: Negative.      Objective:  Physical Exam Constitutional:      Appearance: He is well-developed.  HENT:     Head: Normocephalic and atraumatic.  Cardiovascular:     Rate and Rhythm: Normal rate and regular rhythm.  Pulmonary:     Effort: Pulmonary effort is normal. No respiratory distress.     Breath sounds: Normal breath sounds. No wheezing or rales.  Abdominal:     General: Bowel sounds are normal. There is no distension.     Palpations: Abdomen is soft.     Tenderness: There is abdominal tenderness. There is no rebound.  Musculoskeletal:     Cervical back: Normal range of motion.  Skin:    General: Skin is warm and dry.  Neurological:     Mental Status: He is alert and oriented to person, place, and time.     Coordination: Coordination normal.     Vitals:   09/20/21 0851  BP: 116/72  Pulse: 88  Resp: 18  SpO2: 98%  Weight: 230 lb 12.8 oz (104.7 kg)  Height: '5\' 11"'$  (1.803 m)    Assessment & Plan:

## 2021-09-22 LAB — TESTOSTERONE TOTAL,FREE,BIO, MALES
Albumin: 5 g/dL (ref 3.6–5.1)
Sex Hormone Binding: 13 nmol/L (ref 10–50)
Testosterone, Bioavailable: 833.4 ng/dL — ABNORMAL HIGH (ref 110.0–575.0)
Testosterone, Free: 366.5 pg/mL — ABNORMAL HIGH (ref 46.0–224.0)
Testosterone: 1253 ng/dL — ABNORMAL HIGH (ref 250–827)

## 2021-09-24 ENCOUNTER — Telehealth: Payer: Self-pay

## 2021-09-24 NOTE — Telephone Encounter (Signed)
Pt is requesting a call back from [place].   I asked the pt for more information but he didn't want to disclose his question to me but insisted that [place] call him back.  Please advise

## 2021-09-24 NOTE — Telephone Encounter (Signed)
Patients called returned, he was just wanting clarification from [facility] [place].

## 2021-11-04 ENCOUNTER — Other Ambulatory Visit: Payer: Self-pay | Admitting: Internal Medicine

## 2021-11-06 NOTE — Telephone Encounter (Signed)
Check [place] registry last filled 09/20/2021.Marland KitchenJohny Miller

## 2021-11-08 ENCOUNTER — Encounter: Payer: Self-pay | Admitting: Internal Medicine

## 2021-11-08 ENCOUNTER — Ambulatory Visit (INDEPENDENT_AMBULATORY_CARE_PROVIDER_SITE_OTHER): Payer: 59 | Admitting: Sports Medicine

## 2021-11-08 ENCOUNTER — Ambulatory Visit (INDEPENDENT_AMBULATORY_CARE_PROVIDER_SITE_OTHER): Payer: 59

## 2021-11-08 ENCOUNTER — Ambulatory Visit (INDEPENDENT_AMBULATORY_CARE_PROVIDER_SITE_OTHER): Payer: 59 | Admitting: Internal Medicine

## 2021-11-08 VITALS — BP 128/82 | HR 62 | Ht 71.0 in | Wt 223.0 lb

## 2021-11-08 VITALS — BP 128/82 | HR 70 | Temp 97.8°F | Ht 71.0 in | Wt 223.0 lb

## 2021-11-08 DIAGNOSIS — M79642 Pain in left hand: Secondary | ICD-10-CM

## 2021-11-08 DIAGNOSIS — R7303 Prediabetes: Secondary | ICD-10-CM | POA: Diagnosis not present

## 2021-11-08 DIAGNOSIS — E291 Testicular hypofunction: Secondary | ICD-10-CM

## 2021-11-08 DIAGNOSIS — M5030 Other cervical disc degeneration, unspecified cervical region: Secondary | ICD-10-CM

## 2021-11-08 DIAGNOSIS — R29898 Other symptoms and signs involving the musculoskeletal system: Secondary | ICD-10-CM | POA: Diagnosis not present

## 2021-11-08 DIAGNOSIS — E669 Obesity, unspecified: Secondary | ICD-10-CM | POA: Diagnosis not present

## 2021-11-08 LAB — CBC
HCT: 47.5 % (ref 39.0–52.0)
Hemoglobin: 15.5 g/dL (ref 13.0–17.0)
MCHC: 32.6 g/dL (ref 30.0–36.0)
MCV: 85.3 fl (ref 78.0–100.0)
Platelets: 252 10*3/uL (ref 150.0–400.0)
RBC: 5.57 Mil/uL (ref 4.22–5.81)
RDW: 13.6 % (ref 11.5–15.5)
WBC: 3.7 10*3/uL — ABNORMAL LOW (ref 4.0–10.5)

## 2021-11-08 LAB — HEMOGLOBIN A1C: Hgb A1c MFr Bld: 6.3 % (ref 4.6–6.5)

## 2021-11-08 NOTE — Patient Instructions (Signed)
We will do the labs and the x-ray today and get you in with the specialist.

## 2021-11-08 NOTE — Assessment & Plan Note (Signed)
With worsening weakness. Known neck disease and shoulder disease but hand not assessed. Ordered x-ray hand and refer to sports medicine to assess intervention.

## 2021-11-08 NOTE — Assessment & Plan Note (Signed)
He has changed dose to 100 mg weekly (which was recommended dose, he had been taking 200 mg weekly) testosterone about 1 month ago. Checking CBC and testosterone total and free. Last injection 2 days ago.

## 2021-11-08 NOTE — Assessment & Plan Note (Signed)
Checking HgA1c today and adjust as needed.

## 2021-11-08 NOTE — Assessment & Plan Note (Signed)
Requests refill of phentermine and counseled that this is not safe for ongoing use and should not be refilled. Removed from med list.

## 2021-11-08 NOTE — Progress Notes (Signed)
   Subjective:   Patient ID: Grant Miller, male    DOB: 09/23/1975, 46 y.o.   MRN: [id number]  HPI The patient is a 46 YO man coming in for follow up and concerns.  Review of Systems  Constitutional: Negative.   HENT: Negative.    Eyes: Negative.   Respiratory:  Negative for cough, chest tightness and shortness of breath.   Cardiovascular:  Negative for chest pain, palpitations and leg swelling.  Gastrointestinal:  Negative for abdominal distention, abdominal pain, constipation, diarrhea, nausea and vomiting.  Musculoskeletal:  Positive for myalgias.  Skin: Negative.   Neurological:  Positive for weakness.  Psychiatric/Behavioral: Negative.      Objective:  Physical Exam Constitutional:      Appearance: He is well-developed.  HENT:     Head: Normocephalic and atraumatic.  Cardiovascular:     Rate and Rhythm: Normal rate and regular rhythm.  Pulmonary:     Effort: Pulmonary effort is normal. No respiratory distress.     Breath sounds: Normal breath sounds. No wheezing or rales.  Abdominal:     General: Bowel sounds are normal. There is no distension.     Palpations: Abdomen is soft.     Tenderness: There is no abdominal tenderness. There is no rebound.  Musculoskeletal:        General: Tenderness present.     Cervical back: Normal range of motion.  Skin:    General: Skin is warm and dry.  Neurological:     Mental Status: He is alert and oriented to person, place, and time.     Coordination: Coordination normal.     Vitals:   11/08/21 0752  BP: 128/82  Pulse: 70  Temp: 97.8 F (36.6 C)  TempSrc: Temporal  SpO2: 97%  Weight: 223 lb (101.2 kg)  Height: '5\' 11"'$  (1.803 m)    Assessment & Plan:

## 2021-11-08 NOTE — Patient Instructions (Addendum)
Good to see you Cervical MRI Hand HEP  Follow up 3 days after your MRI to discuss your results

## 2021-11-08 NOTE — Progress Notes (Signed)
Benito Mccreedy D.[place] Phone: [phone]   Assessment and Plan:     1. DDD (degenerative disc disease), cervical 2. Left hand weakness  -Chronic with exacerbation, initial sports medicine visit - Concern for cervical radiculopathy and myelopathy that is progressing over the past 1 to 2 months with worsening weakness in left hand - Due to progressive weakness, DDD seen on prior x-rays, we will evaluate with MRI of cervical spine - Start HEP for intrinsic hand muscle strengthening to prevent progressive weakness - Reviewed left hand x-ray with patient in room.  My interpretation: No acute fracture or dislocation.  Unremarkable exam  Pertinent previous records reviewed include C-spine x-ray 08/17/2020, left hand x-ray 11/08/2021, family medicine note 11/08/2021   Follow Up: 3 days after MRI to review results and discuss treatment plan   Subjective:   I, [name], am serving as a Education administrator for Doctor Glennon Mac  Chief Complaint: left hand pain   HPI:   11/08/21 Patient is a 46 year old male complaining of left hand pain.Patient states that his hand is weak, no MOI , isn't able to open jars and things like that has been going on for yea has a hx of shoulder dislocation gets CSIs every three months does get some numbness and tingling sometimes did get a CSI in his elbow last week   Relevant Historical Information: Cervical DDD  Additional pertinent review of systems negative.   Current Outpatient Medications:    anastrozole (ARIMIDEX) 1 MG tablet, Take 1 mg by mouth once a week. Saturdays, Disp: , Rfl:    BIOTIN PO, Take 1 tablet by mouth daily., Disp: , Rfl:    Cholecalciferol (D3 PO), Take 1 capsule by mouth daily., Disp: , Rfl:    DULoxetine (CYMBALTA) 30 MG capsule, Take 1 capsule (30 mg total) by mouth daily., Disp: 30 capsule, Rfl: 3   meloxicam (MOBIC) 15 MG tablet, Take 1 tablet (15 mg  total) by mouth daily., Disp: 90 tablet, Rfl: 1   methocarbamol (ROBAXIN) 500 MG tablet, Take 1 tablet (500 mg total) by mouth 3 (three) times daily as needed for muscle spasms., Disp: 90 tablet, Rfl: 3   Multiple Vitamins-Minerals (ZINC PO), Take 1 tablet by mouth daily., Disp: , Rfl:    sildenafil (VIAGRA) 100 MG tablet, Take 0.5-1 tablets (50-100 mg total) by mouth daily as needed for erectile dysfunction., Disp: 15 tablet, Rfl: 11   Syringe/Needle, Disp, (SYRINGE 3CC/20GX1") 20G X 1" 3 ML MISC, Use weekly with testosterone, Disp: 50 each, Rfl: 0   testosterone cypionate (DEPOTESTOSTERONE CYPIONATE) 200 MG/ML injection, Inject 0.5 mLs (100 mg total) into the muscle every 7 (seven) days., Disp: 10 mL, Rfl: 1   Objective:     Vitals:   11/08/21 0817  BP: 128/82  Pulse: 62  SpO2: 98%  Weight: 223 lb (101.2 kg)  Height: '5\' 11"'$  (1.803 m)      Body mass index is 31.1 kg/m.    Physical Exam:    General: awake, alert, and oriented no acute distress, nontoxic Skin: no suspicious lesions or rashes Neuro:sensation intact distally with no dificits, normal muscle tone, no atrophy, strength 5/5 in all tested lower ext groups Psych: normal mood and affect, speech clear  Cervical Spine: Posture normal Skin: normal, intact  Neurological:   Strength:  Right  Left   Deltoid 5/5 5/5  Bicep 5/5  5/5  Tricep 5/5 5/5  Wrist Flexion  5/5 5/5  Wrist Extension 5/5 4/5  Grip 5/5 4 /5  Finger Abduction 5/5 4/5   Sensation: intact to light touch in upper extremities bilaterally  Spurling's:  negative bilaterally Neck ROM: Full active ROM  NTTP: cervical spinous processes, cervical paraspinal, thoracic paraspinal, trapezius    Electronically signed by:  Benito Mccreedy D.[facility] 9:00 AM 11/08/21

## 2021-11-09 LAB — TESTOSTERONE TOTAL,FREE,BIO, MALES
Albumin: 4.8 g/dL (ref 3.6–5.1)
Sex Hormone Binding: 12 nmol/L (ref 10–50)
Testosterone, Bioavailable: 654.5 ng/dL — ABNORMAL HIGH (ref 110.0–575.0)
Testosterone, Free: 299.3 pg/mL — ABNORMAL HIGH (ref 46.0–224.0)
Testosterone: 1007 ng/dL — ABNORMAL HIGH (ref 250–827)

## 2021-11-14 ENCOUNTER — Ambulatory Visit: Payer: Commercial Managed Care - PPO | Admitting: Sports Medicine

## 2021-11-20 ENCOUNTER — Inpatient Hospital Stay: Payer: 59 | Attending: Oncology

## 2021-11-20 ENCOUNTER — Other Ambulatory Visit: Payer: Self-pay

## 2021-11-20 ENCOUNTER — Inpatient Hospital Stay (HOSPITAL_BASED_OUTPATIENT_CLINIC_OR_DEPARTMENT_OTHER): Payer: 59 | Admitting: Oncology

## 2021-11-20 VITALS — BP 126/74 | HR 62 | Temp 98.1°F | Resp 16 | Ht 71.0 in | Wt 225.9 lb

## 2021-11-20 DIAGNOSIS — D704 Cyclic neutropenia: Secondary | ICD-10-CM | POA: Insufficient documentation

## 2021-11-20 DIAGNOSIS — D709 Neutropenia, unspecified: Secondary | ICD-10-CM

## 2021-11-20 DIAGNOSIS — Z79899 Other long term (current) drug therapy: Secondary | ICD-10-CM | POA: Insufficient documentation

## 2021-11-20 LAB — CBC WITH DIFFERENTIAL (CANCER CENTER ONLY)
Abs Immature Granulocytes: 0.01 10*3/uL (ref 0.00–0.07)
Basophils Absolute: 0 10*3/uL (ref 0.0–0.1)
Basophils Relative: 1 %
Eosinophils Absolute: 0.1 10*3/uL (ref 0.0–0.5)
Eosinophils Relative: 3 %
HCT: 45.8 % (ref 39.0–52.0)
Hemoglobin: 15.2 g/dL (ref 13.0–17.0)
Immature Granulocytes: 0 %
Lymphocytes Relative: 45 %
Lymphs Abs: 1.9 10*3/uL (ref 0.7–4.0)
MCH: 27.8 pg (ref 26.0–34.0)
MCHC: 33.2 g/dL (ref 30.0–36.0)
MCV: 83.7 fL (ref 80.0–100.0)
Monocytes Absolute: 0.4 10*3/uL (ref 0.1–1.0)
Monocytes Relative: 9 %
Neutro Abs: 1.7 10*3/uL (ref 1.7–7.7)
Neutrophils Relative %: 42 %
Platelet Count: 217 10*3/uL (ref 150–400)
RBC: 5.47 MIL/uL (ref 4.22–5.81)
RDW: 13.8 % (ref 11.5–15.5)
WBC Count: 4.1 10*3/uL (ref 4.0–10.5)
nRBC: 0 % (ref 0.0–0.2)

## 2021-11-20 NOTE — Progress Notes (Signed)
Hematology and Oncology Follow Up Visit  Grant Miller [id number] Aug 26, 1975 46 y.o. 11/20/2021 8:18 AM   Principle Diagnosis: 46 year old man with benign cyclic neutropenia that has fluctuated since 2009.  His work-up did not reveal any hematological disorder.   Current therapy: Active surveillance.   Interim History: Grant Miller presents today for a follow-up visit.  Since last visit, he reports feeling well without any major complaints.  He denies any nausea vomiting or abdominal pain.  He denies any recent hospitalizations or illnesses.  He remains on testosterone supplements once a week without any complaints.     Medications: Updated on review.  Current Outpatient Medications  Medication Sig Dispense Refill   anastrozole (ARIMIDEX) 1 MG tablet Take 1 mg by mouth once a week. Saturdays     BIOTIN PO Take 1 tablet by mouth daily.     Cholecalciferol (D3 PO) Take 1 capsule by mouth daily.     DULoxetine (CYMBALTA) 30 MG capsule Take 1 capsule (30 mg total) by mouth daily. 30 capsule 3   meloxicam (MOBIC) 15 MG tablet Take 1 tablet (15 mg total) by mouth daily. 90 tablet 1   methocarbamol (ROBAXIN) 500 MG tablet Take 1 tablet (500 mg total) by mouth 3 (three) times daily as needed for muscle spasms. 90 tablet 3   Multiple Vitamins-Minerals (ZINC PO) Take 1 tablet by mouth daily.     sildenafil (VIAGRA) 100 MG tablet Take 0.5-1 tablets (50-100 mg total) by mouth daily as needed for erectile dysfunction. 15 tablet 11   Syringe/Needle, Disp, (SYRINGE 3CC/20GX1") 20G X 1" 3 ML MISC Use weekly with testosterone 50 each 0   testosterone cypionate (DEPOTESTOSTERONE CYPIONATE) 200 MG/ML injection Inject 0.5 mLs (100 mg total) into the muscle every 7 (seven) days. 10 mL 1   No current facility-administered medications for this visit.    Allergies:  Allergies  Allergen Reactions   Iodine Hives and Shortness Of Breath   Other Hives and Shortness Of Breath    All seafood causes this  reaction   Shellfish Allergy     Other reaction(s): SHORTNESS OF BREATH       Physical Exam:    Blood pressure 126/74, pulse 62, temperature 98.1 F (36.7 C), temperature source Temporal, resp. rate 16, height '5\' 11"'$  (1.803 m), weight 225 lb 14.4 oz (102.5 kg), SpO2 100 %.     ECOG: 0   General appearance: Alert, awake without any distress. Head: Atraumatic without abnormalities Oropharynx: Without any thrush or ulcers. Eyes: No scleral icterus. Lymph nodes: No lymphadenopathy noted in the cervical, supraclavicular, or axillary nodes Heart:regular rate and rhythm, without any murmurs or gallops.   Lung: Clear to auscultation without any rhonchi, wheezes or dullness to percussion. Abdomin: Soft, nontender without any shifting dullness or ascites. Musculoskeletal: No clubbing or cyanosis. Neurological: No motor or sensory deficits. Skin: No rashes or lesions.          Lab Results: Lab Results  Component Value Date   WBC 4.1 11/20/2021   HGB 15.2 11/20/2021   HCT 45.8 11/20/2021   MCV 83.7 11/20/2021   PLT 217 11/20/2021     Chemistry      Component Value Date/Time   NA 138 09/20/2021 0911   NA 142 08/16/2014 0759   K 4.0 09/20/2021 0911   K 3.8 08/16/2014 0759   CL 103 09/20/2021 0911   CO2 24 09/20/2021 0911   CO2 22 08/16/2014 0759   BUN 20 09/20/2021 0911   BUN  19.6 08/16/2014 0759   CREATININE 1.26 09/20/2021 0911   CREATININE 1.20 03/28/2020 1211   CREATININE 1.2 08/16/2014 0759      Component Value Date/Time   CALCIUM 9.9 09/20/2021 0911   CALCIUM 9.2 08/16/2014 0759   ALKPHOS 33 (L) 09/20/2021 0911   ALKPHOS 62 08/16/2014 0759   AST 31 09/20/2021 0911   AST 38 03/28/2020 1211   AST 35 (H) 08/16/2014 0759   ALT 28 09/20/2021 0911   ALT 43 03/28/2020 1211   ALT 36 08/16/2014 0759   BILITOT 0.8 09/20/2021 0911   BILITOT 0.6 03/28/2020 1211   BILITOT 0.43 08/16/2014 0759       Impression and Plan:   46 year old man with:   1.   Benign neutropenia with fluctuating counts dating back to 2009.  CBC from today was personally reviewed and showed neutrophil percentage around 42% with absolute neutrophil count of 1700.  His hemoglobin and platelet count within normal range.  His differential otherwise normal.  All these findings indicate no hematological disorder at this time.  Management options including growth factor support will be deferred given the complete normal counts at this time.  2. Health maintenance issues.  He has completed colonoscopy recently with polyps.  This will be repeated in the future.  3. Follow-up: I am happy to see him in the future as needed.  He prefers to return in 6 months for repeat laboratory testing.   20 minutes were spent on this encounter.  The time was dedicated to reviewing laboratory data, differential diagnosis and management choices.    Zola Button, MD 9/19/20238:18 AM

## 2022-03-05 ENCOUNTER — Other Ambulatory Visit: Payer: Self-pay | Admitting: Internal Medicine

## 2022-03-18 ENCOUNTER — Ambulatory Visit (INDEPENDENT_AMBULATORY_CARE_PROVIDER_SITE_OTHER): Payer: 59 | Admitting: Internal Medicine

## 2022-03-18 ENCOUNTER — Encounter: Payer: Self-pay | Admitting: Internal Medicine

## 2022-03-18 VITALS — BP 136/76 | HR 62 | Temp 98.1°F | Wt 237.0 lb

## 2022-03-18 DIAGNOSIS — R7303 Prediabetes: Secondary | ICD-10-CM | POA: Diagnosis not present

## 2022-03-18 DIAGNOSIS — M25512 Pain in left shoulder: Secondary | ICD-10-CM

## 2022-03-18 DIAGNOSIS — Z0000 Encounter for general adult medical examination without abnormal findings: Secondary | ICD-10-CM | POA: Diagnosis not present

## 2022-03-18 DIAGNOSIS — G8929 Other chronic pain: Secondary | ICD-10-CM

## 2022-03-18 DIAGNOSIS — E291 Testicular hypofunction: Secondary | ICD-10-CM | POA: Diagnosis not present

## 2022-03-18 NOTE — Assessment & Plan Note (Signed)
Patient brought MRI with him and explained findings to him. He desires to follow up with sports medicine encouraged him to make that visit.

## 2022-03-18 NOTE — Assessment & Plan Note (Signed)
Refill testosterone 100 mg weekly as needed. Levels recently at goal and CBC up to date.

## 2022-03-18 NOTE — Progress Notes (Unsigned)
Grant Miller D.[place] Phone: [phone]   Assessment and Plan:    1. Chronic left shoulder pain 2. Separation of left acromioclavicular joint, initial encounter  -Chronic with exacerbation, initial sports medicine visit - History of chronic left shoulder pain and AC joint separation as seen on left shoulder x-ray on 08/17/2020 - Patient has been getting CSI every 3 to 4 months with an orthopedic clinic, however he states that they were backed up and could not see him, so he wishes to transfer care to [place].  Based on patient description and pictures of prior injections, it appears that they are performing AC joint and subacromial CSI.  Based on prior x-ray imaging and my physical exam today, we will repeat subacromial and AC joint CSI.  Tolerated well per note below.  Multiple CSI may increase blood glucose in patient with history of prediabetes - Start HEP for shoulder and rotator cuff - Patient states that he had a MRI of his left shoulder and that he brought report to primary care visit yesterday.  I do not see these results in our system at this time.  Procedure: Subacromial Injection Side: Left  Risks explained and consent was given verbally. The site was cleaned with alcohol prep. A steroid injection was performed from posterior approach using 54m of 1% lidocaine without epinephrine and 134mof kenalog '40mg'$ /ml. This was well tolerated and resulted in symptomatic relief.  Needle was removed, hemostasis achieved, and post injection instructions were explained.   Pt was advised to call or return to clinic if these symptoms worsen or fail to improve as anticipated.    Procedure: Acromioclavicular Joint Injection Side: Left Diagnosis: AC joint pain with history of separation  After explaining the procedure, viable alternatives, risks, and answering any questions, consent was given verbally. The site was cleaned  with alcohol prep.  A steroid injection was performed  using  0.5 ml of 1% lidocaine without epinephrine and 20 mg of triamcinolone (KENALOG) '40mg'$ /ml. This was well tolerated and resulted in relief.  Needle was removed and dressing placed and post injection instructions were given including  a discussion of likely return of pain today after the anesthetic wears off (with the possibility of worsened pain) until the steroid starts to work in 1-3 days.   Pt was advised to call or return to clinic if these symptoms worsen or fail to improve as anticipated.   Pertinent previous records reviewed include left shoulder x-ray 08/17/2020   Follow Up: 3 to 4 weeks for reevaluation.  Could discuss patient's flare of chronic right hip pain at that time and potentially perform intra-articular right hip CSI   Subjective:   I, [name] serving as a scEducation administratoror Doctor BeGlennon MacChief Complaint: Left shoulder pain   HPI:   03/19/2022 Patient is a [age]ear old male complaining of left shoulder pain. Patient states that his shoulder is frozen, been going on for months , wants a CSI last one was in September, decreased ROM due to pain ,  Relevant Historical Information: Prediabetes  Additional pertinent review of systems negative.   Current Outpatient Medications:    anastrozole (ARIMIDEX) 1 MG tablet, Take 1 mg by mouth once a week. Saturdays, Disp: , Rfl:    BIOTIN PO, Take 1 tablet by mouth daily., Disp: , Rfl:    Cholecalciferol (D3 PO), Take 1 capsule by mouth daily., Disp: , Rfl:  DULoxetine (CYMBALTA) 30 MG capsule, Take 1 capsule (30 mg total) by mouth daily., Disp: 30 capsule, Rfl: 3   meloxicam (MOBIC) 15 MG tablet, Take 1 tablet (15 mg total) by mouth daily., Disp: 90 tablet, Rfl: 1   methocarbamol (ROBAXIN) 500 MG tablet, Take 1 tablet (500 mg total) by mouth 3 (three) times daily as needed for muscle spasms., Disp: 90 tablet, Rfl: 3   Multiple Vitamins-Minerals (ZINC PO), Take 1  tablet by mouth daily., Disp: , Rfl:    sildenafil (VIAGRA) 100 MG tablet, Take 0.5-1 tablets (50-100 mg total) by mouth daily as needed for erectile dysfunction., Disp: 15 tablet, Rfl: 11   Syringe/Needle, Disp, (SYRINGE 3CC/20GX1") 20G X 1" 3 ML MISC, Use weekly with testosterone, Disp: 50 each, Rfl: 0   testosterone cypionate (DEPOTESTOSTERONE CYPIONATE) 200 MG/ML injection, INJECT 0.5 ML INTO THE MUSCLE EVERY 7 DAYS, Disp: 10 mL, Rfl: 0   Objective:     Vitals:   03/19/22 0742  BP: 138/88  Pulse: 68  SpO2: 98%  Weight: 237 lb (107.5 kg)  Height: '5\' 11"'$  (1.803 m)      Body mass index is 33.05 kg/m.    Physical Exam:    Gen: Appears well, nad, nontoxic and pleasant Neuro:sensation intact, strength is 5/5 with df/pf/inv/ev, muscle tone wnl Skin: no suspicious lesion or defmority Psych: A&O, appropriate mood and affect  Left shoulder: no deformity, swelling or muscle wasting No scapular winging FF 100, abd 100, int 15, ext 80 TTP AC, biceps groove, deltoid, trapezius NTTP over the [place], clavicle, humerus,    cervical spine Positive Neer, Hawking's, empty can, O'Brien, crossarm Neg  subscap liftoff, speeds Neg ant drawer, sulcus sign, apprehension Negative Spurling's test bilat FROM of neck    Electronically signed by:  Grant Miller D.[facility] 8:11 AM 03/19/22

## 2022-03-18 NOTE — Progress Notes (Signed)
   Subjective:   Patient ID: Grant Miller, male    DOB: 1975-05-11, 47 y.o.   MRN: [id number]  HPI The patient is here for physical.  PMH, [facility], social history reviewed and updated  Review of Systems  Constitutional: Negative.   HENT: Negative.    Eyes: Negative.   Respiratory:  Negative for cough, chest tightness and shortness of breath.   Cardiovascular:  Negative for chest pain, palpitations and leg swelling.  Gastrointestinal:  Negative for abdominal distention, abdominal pain, constipation, diarrhea, nausea and vomiting.  Musculoskeletal:  Positive for arthralgias.  Skin: Negative.   Neurological: Negative.   Psychiatric/Behavioral: Negative.      Objective:  Physical Exam Constitutional:      Appearance: He is well-developed.  HENT:     Head: Normocephalic and atraumatic.  Cardiovascular:     Rate and Rhythm: Normal rate and regular rhythm.  Pulmonary:     Effort: Pulmonary effort is normal. No respiratory distress.     Breath sounds: Normal breath sounds. No wheezing or rales.  Abdominal:     General: Bowel sounds are normal. There is no distension.     Palpations: Abdomen is soft.     Tenderness: There is no abdominal tenderness. There is no rebound.  Musculoskeletal:        General: Tenderness present.     Cervical back: Normal range of motion.  Skin:    General: Skin is warm and dry.  Neurological:     Mental Status: He is alert and oriented to person, place, and time.     Coordination: Coordination normal.     Vitals:   03/18/22 1000 03/18/22 1005 03/18/22 1019  BP: (!) 160/100 (!) 160/100 136/76  Pulse: 62    Temp: 98.1 F (36.7 C)    TempSrc: Oral    SpO2: 98%    Weight: 237 lb (107.5 kg)      Assessment & Plan:

## 2022-03-18 NOTE — Patient Instructions (Signed)
We will get you in with sports medicine

## 2022-03-18 NOTE — Assessment & Plan Note (Signed)
Counseled he does not qualify for ozempic due to having pre-diabetes and not diabetes. Counseled about diet and exercise to help.

## 2022-03-18 NOTE — Assessment & Plan Note (Signed)
Flu shot declines. Covid-19 counseled. Tetanus counseled. Colonoscopy up to date. Counseled about sun safety and mole surveillance. Counseled about the dangers of distracted driving. Given 10 year screening recommendations.

## 2022-03-19 ENCOUNTER — Ambulatory Visit (INDEPENDENT_AMBULATORY_CARE_PROVIDER_SITE_OTHER): Payer: 59 | Admitting: Sports Medicine

## 2022-03-19 VITALS — BP 138/88 | HR 68 | Ht 71.0 in | Wt 237.0 lb

## 2022-03-19 DIAGNOSIS — M25512 Pain in left shoulder: Secondary | ICD-10-CM | POA: Diagnosis not present

## 2022-03-19 DIAGNOSIS — S43102A Unspecified dislocation of left acromioclavicular joint, initial encounter: Secondary | ICD-10-CM | POA: Diagnosis not present

## 2022-03-19 DIAGNOSIS — G8929 Other chronic pain: Secondary | ICD-10-CM

## 2022-03-19 NOTE — Patient Instructions (Signed)
Shoulder HEP 3-4 week follow up

## 2022-04-09 ENCOUNTER — Ambulatory Visit: Payer: 59 | Admitting: Sports Medicine

## 2022-04-09 NOTE — Progress Notes (Unsigned)
    Grant Miller D.[place] Phone: [phone]   Assessment and Plan:     There are no diagnoses linked to this encounter.  ***   Pertinent previous records reviewed include ***   Follow Up: ***     Subjective:   I, Grant Miller, am serving as a Education administrator for Grant Miller   Chief Complaint: Left shoulder pain    HPI:    03/19/2022 Patient is a 47 year old male complaining of left shoulder pain. 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   Current Outpatient Medications:    anastrozole (ARIMIDEX) 1 MG tablet, Take 1 mg by mouth once a week. Saturdays, Disp: , Rfl:    BIOTIN PO, Take 1 tablet by mouth daily., Disp: , Rfl:    Cholecalciferol (D3 PO), Take 1 capsule by mouth daily., Disp: , Rfl:    DULoxetine (CYMBALTA) 30 MG capsule, Take 1 capsule (30 mg total) by mouth daily., Disp: 30 capsule, Rfl: 3   meloxicam (MOBIC) 15 MG tablet, Take 1 tablet (15 mg total) by mouth daily., Disp: 90 tablet, Rfl: 1   methocarbamol (ROBAXIN) 500 MG tablet, Take 1 tablet (500 mg total) by mouth 3 (three) times daily as needed for muscle spasms., Disp: 90 tablet, Rfl: 3   Multiple Vitamins-Minerals (ZINC PO), Take 1 tablet by mouth daily., Disp: , Rfl:    sildenafil (VIAGRA) 100 MG tablet, Take 0.5-1 tablets (50-100 mg total) by mouth daily as needed for erectile dysfunction., Disp: 15 tablet, Rfl: 11   Syringe/Needle, Disp, (SYRINGE 3CC/20GX1") 20G X 1" 3 ML MISC, Use weekly with testosterone, Disp: 50 each, Rfl: 0   testosterone cypionate (DEPOTESTOSTERONE CYPIONATE) 200 MG/ML injection, INJECT 0.5 ML INTO THE MUSCLE EVERY 7 DAYS, Disp: 10 mL, Rfl: 0   Objective:     There were no  vitals filed for this visit.    There is no height or weight on file to calculate BMI.    Physical Exam:    ***   Electronically signed by:  Grant Miller D.[facility] 12:27 PM 04/09/22

## 2022-04-10 ENCOUNTER — Ambulatory Visit: Payer: Self-pay

## 2022-04-10 ENCOUNTER — Ambulatory Visit (INDEPENDENT_AMBULATORY_CARE_PROVIDER_SITE_OTHER): Payer: 59 | Admitting: Sports Medicine

## 2022-04-10 VITALS — HR 86 | Ht 71.0 in | Wt 233.0 lb

## 2022-04-10 DIAGNOSIS — M25512 Pain in left shoulder: Secondary | ICD-10-CM | POA: Diagnosis not present

## 2022-04-10 DIAGNOSIS — S43102A Unspecified dislocation of left acromioclavicular joint, initial encounter: Secondary | ICD-10-CM | POA: Diagnosis not present

## 2022-04-10 DIAGNOSIS — G8929 Other chronic pain: Secondary | ICD-10-CM | POA: Diagnosis not present

## 2022-04-10 DIAGNOSIS — M19012 Primary osteoarthritis, left shoulder: Secondary | ICD-10-CM | POA: Diagnosis not present

## 2022-04-10 NOTE — Patient Instructions (Addendum)
Good to see you  PT referral  3 week follow up

## 2022-04-11 ENCOUNTER — Ambulatory Visit: Payer: 59 | Admitting: Sports Medicine

## 2022-04-30 NOTE — Progress Notes (Unsigned)
    Grant Miller D.Grant Miller Phone: [phone]   Assessment and Plan:     There are no diagnoses linked to this encounter.  ***   Pertinent previous records reviewed include ***   Follow Up: ***     Subjective:   I, Grant Miller, am serving as a Education administrator for Grant Miller   Chief Complaint: Left shoulder pain    HPI:    03/19/2022 Patient is a 47 year old male complaining of left shoulder pain. 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   Current Outpatient Medications:    anastrozole (ARIMIDEX) 1 MG tablet, Take 1 mg by mouth once a week. Saturdays, Disp: , Rfl:    BIOTIN PO, Take 1 tablet by mouth daily., Disp: , Rfl:    Cholecalciferol (D3 PO), Take 1 capsule by mouth daily., Disp: , Rfl:    DULoxetine (CYMBALTA) 30 MG capsule, Take 1 capsule (30 mg total) by mouth daily., Disp: 30 capsule, Rfl: 3   meloxicam (MOBIC) 15 MG tablet, Take 1 tablet (15 mg total) by mouth daily., Disp: 90 tablet, Rfl: 1   methocarbamol (ROBAXIN) 500 MG tablet, Take 1 tablet (500 mg total) by mouth 3 (three) times daily as needed for muscle spasms., Disp: 90 tablet, Rfl: 3   Multiple Vitamins-Minerals (ZINC PO), Take 1 tablet by mouth daily., Disp: , Rfl:    sildenafil (VIAGRA) 100 MG tablet, Take 0.5-1 tablets (50-100 mg total) by mouth daily as needed for erectile dysfunction., Disp: 15 tablet, Rfl: 11   Syringe/Needle, Disp, (SYRINGE 3CC/20GX1") 20G X 1" 3 ML MISC, Use weekly with testosterone, Disp: 50 each, Rfl: 0   testosterone cypionate (DEPOTESTOSTERONE CYPIONATE) 200 MG/ML injection, INJECT 0.5 ML INTO THE  MUSCLE EVERY 7 DAYS, Disp: 10 mL, Rfl: 0   Objective:     There were no vitals filed for this visit.    There is no height or weight on file to calculate BMI.    Physical Exam:    ***   Electronically signed by:  Grant Miller D.[facility] 4:07 PM 04/30/22

## 2022-05-01 ENCOUNTER — Ambulatory Visit (INDEPENDENT_AMBULATORY_CARE_PROVIDER_SITE_OTHER): Payer: 59 | Admitting: Sports Medicine

## 2022-05-01 VITALS — BP 132/80 | HR 69 | Ht 71.0 in | Wt 234.0 lb

## 2022-05-01 DIAGNOSIS — M19012 Primary osteoarthritis, left shoulder: Secondary | ICD-10-CM | POA: Diagnosis not present

## 2022-05-01 DIAGNOSIS — S43102A Unspecified dislocation of left acromioclavicular joint, initial encounter: Secondary | ICD-10-CM

## 2022-05-01 DIAGNOSIS — M25512 Pain in left shoulder: Secondary | ICD-10-CM

## 2022-05-01 DIAGNOSIS — G8929 Other chronic pain: Secondary | ICD-10-CM | POA: Diagnosis not present

## 2022-05-01 NOTE — Patient Instructions (Signed)
Good to see you   

## 2022-05-20 ENCOUNTER — Other Ambulatory Visit: Payer: Self-pay | Admitting: Physician Assistant

## 2022-05-20 DIAGNOSIS — D709 Neutropenia, unspecified: Secondary | ICD-10-CM

## 2022-05-21 ENCOUNTER — Inpatient Hospital Stay (HOSPITAL_BASED_OUTPATIENT_CLINIC_OR_DEPARTMENT_OTHER): Payer: 59 | Admitting: Physician Assistant

## 2022-05-21 ENCOUNTER — Other Ambulatory Visit: Payer: 59

## 2022-05-21 ENCOUNTER — Other Ambulatory Visit: Payer: Self-pay

## 2022-05-21 ENCOUNTER — Ambulatory Visit: Payer: 59 | Admitting: Oncology

## 2022-05-21 ENCOUNTER — Inpatient Hospital Stay: Payer: 59 | Attending: Internal Medicine

## 2022-05-21 VITALS — BP 134/72 | HR 61 | Temp 97.5°F | Resp 15 | Wt 237.0 lb

## 2022-05-21 DIAGNOSIS — D709 Neutropenia, unspecified: Secondary | ICD-10-CM

## 2022-05-21 DIAGNOSIS — R5382 Chronic fatigue, unspecified: Secondary | ICD-10-CM | POA: Insufficient documentation

## 2022-05-21 DIAGNOSIS — Z87891 Personal history of nicotine dependence: Secondary | ICD-10-CM | POA: Insufficient documentation

## 2022-05-21 LAB — CBC WITH DIFFERENTIAL (CANCER CENTER ONLY)
Abs Immature Granulocytes: 0 10*3/uL (ref 0.00–0.07)
Basophils Absolute: 0 10*3/uL (ref 0.0–0.1)
Basophils Relative: 1 %
Eosinophils Absolute: 0.1 10*3/uL (ref 0.0–0.5)
Eosinophils Relative: 2 %
HCT: 44.8 % (ref 39.0–52.0)
Hemoglobin: 14.9 g/dL (ref 13.0–17.0)
Immature Granulocytes: 0 %
Lymphocytes Relative: 45 %
Lymphs Abs: 2 10*3/uL (ref 0.7–4.0)
MCH: 28.1 pg (ref 26.0–34.0)
MCHC: 33.3 g/dL (ref 30.0–36.0)
MCV: 84.5 fL (ref 80.0–100.0)
Monocytes Absolute: 0.4 10*3/uL (ref 0.1–1.0)
Monocytes Relative: 10 %
Neutro Abs: 1.9 10*3/uL (ref 1.7–7.7)
Neutrophils Relative %: 42 %
Platelet Count: 255 10*3/uL (ref 150–400)
RBC: 5.3 MIL/uL (ref 4.22–5.81)
RDW: 13.8 % (ref 11.5–15.5)
WBC Count: 4.4 10*3/uL (ref 4.0–10.5)
nRBC: 0 % (ref 0.0–0.2)

## 2022-05-21 LAB — CMP (CANCER CENTER ONLY)
ALT: 33 U/L (ref 0–44)
AST: 45 U/L — ABNORMAL HIGH (ref 15–41)
Albumin: 4.6 g/dL (ref 3.5–5.0)
Alkaline Phosphatase: 45 U/L (ref 38–126)
Anion gap: 6 (ref 5–15)
BUN: 14 mg/dL (ref 6–20)
CO2: 27 mmol/L (ref 22–32)
Calcium: 9.5 mg/dL (ref 8.9–10.3)
Chloride: 107 mmol/L (ref 98–111)
Creatinine: 1.35 mg/dL — ABNORMAL HIGH (ref 0.61–1.24)
GFR, Estimated: >60 mL/min (ref >60)
Glucose, Bld: 94 mg/dL (ref 70–99)
Potassium: 3.9 mmol/L (ref 3.5–5.1)
Sodium: 140 mmol/L (ref 135–145)
Total Bilirubin: 0.7 mg/dL (ref 0.3–1.2)
Total Protein: 7.3 g/dL (ref 6.5–8.1)

## 2022-05-21 NOTE — Progress Notes (Signed)
[facility] [phone]   [phone]  PROGRESS NOTE  Patient Care Team: Hoyt Koch, MD as PCP - General (Internal Medicine)  CHIEF COMPLAINTS/PURPOSE OF CONSULTATION:  Neutropenia  HISTORY OF PRESENTING ILLNESS:  Grant Miller 47 y.o. male returns for follow-up for evaluation of neutropenia.  Grant Miller was last evaluated by Dr. Alen Blew on 11/20/2021.  In the interim since the last visit, he denies any changes to his health.  On exam today, Grant Miller reports having fatigue which is chronic in nature but he is able to complete all his daily activities on his own.  He continues on weekly testosterone supplementation without any side effects.  He has a good appetite and denies any significant weight changes.  He denies nausea, vomiting or abdominal pain.  His bowel habits are unchanged without any recurrent episodes of diarrhea or constipation.  He denies easy bruising or signs of active bleeding.  Patient denies fevers, chills, night sweats, shortness of breath, chest pain or cough.  He has no other complaints.  Rest of the 10 point ROS is below.  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 Left     SOCIAL HISTORY: Social History   Socioeconomic History   Marital status: Single    Spouse name: Not on file   Number of children: Not on file   Years of education: Not on file   Highest education level: Not on file  Occupational History   Not on file  Tobacco Use   Smoking status: Former    Types: Cigarettes    Quit date: 2003    Years since quitting: 21.2   Smokeless tobacco: Never  Vaping Use   Vaping Use: Never used  Substance and Sexual Activity   Alcohol use: No   Drug use: No   Sexual activity: Not on file  Other Topics  Concern   Not on file  Social History Narrative   Not on file   Social Determinants of Health   Financial Resource Strain: Low Risk  (07/10/2021)   Overall Financial Resource Strain (CARDIA)    Difficulty of Paying Living Expenses: Not hard at all  Food Insecurity: No Food Insecurity (07/10/2021)   Hunger Vital Sign    Worried About Running Out of Food in the Last Year: Never true    Ran Out of Food in the Last Year: Never true  Transportation Needs: No Transportation Needs (07/10/2021)   PRAPARE - Hydrologist (Medical): No    Lack of Transportation (Non-Medical): No  Physical Activity: Sufficiently Active (07/10/2021)   Exercise Vital Sign    Days of Exercise per Week: 3 days    Minutes of Exercise per Session: 50 min  Stress: No Stress Concern Present (07/10/2021)   [place]    Feeling of Stress : Not at all  Social Connections: Moderately Integrated (07/10/2021)   Social Connection and Isolation Panel [NHANES]    Frequency of Communication with Friends and Family: More than three times a week    Frequency of Social Gatherings with Friends and Family: More than three times a week    Attends Religious Services: 1  to 4 times per year    Active Member of Clubs or Organizations: No    Attends Archivist Meetings: 1 to 4 times per year    Marital Status: Never married  Intimate Partner Violence: Not At Risk (07/10/2021)   Humiliation, Afraid, Rape, and Kick questionnaire    Fear of Current or Ex-Partner: No    Emotionally Abused: No    Physically Abused: No    Sexually Abused: No    FAMILY HISTORY: Family History  Problem Relation Age of Onset   Heart disease Mother    Hyperlipidemia Mother    Kidney disease Mother    Diabetes Mother    Colon cancer Neg Hx    Esophageal cancer Neg Hx    Rectal cancer Neg Hx    Stomach cancer Neg Hx     ALLERGIES:  is allergic to iodine,  other, and shellfish allergy.  MEDICATIONS:  Current Outpatient Medications  Medication Sig Dispense Refill   anastrozole (ARIMIDEX) 1 MG tablet Take 1 mg by mouth once a week. Saturdays     BIOTIN PO Take 1 tablet by mouth daily.     Cholecalciferol (D3 PO) Take 1 capsule by mouth daily.     DULoxetine (CYMBALTA) 30 MG capsule Take 1 capsule (30 mg total) by mouth daily. 30 capsule 3   meloxicam (MOBIC) 15 MG tablet Take 1 tablet (15 mg total) by mouth daily. 90 tablet 1   methocarbamol (ROBAXIN) 500 MG tablet Take 1 tablet (500 mg total) by mouth 3 (three) times daily as needed for muscle spasms. 90 tablet 3   Multiple Vitamins-Minerals (ZINC PO) Take 1 tablet by mouth daily.     sildenafil (VIAGRA) 100 MG tablet Take 0.5-1 tablets (50-100 mg total) by mouth daily as needed for erectile dysfunction. 15 tablet 11   Syringe/Needle, Disp, (SYRINGE 3CC/20GX1") 20G X 1" 3 ML MISC Use weekly with testosterone 50 each 0   testosterone cypionate (DEPOTESTOSTERONE CYPIONATE) 200 MG/ML injection INJECT 0.5 ML INTO THE MUSCLE EVERY 7 DAYS 10 mL 0   No current facility-administered medications for this visit.    REVIEW OF SYSTEMS:   Constitutional: ( - ) fevers, ( - )  chills , ( - ) night sweats Eyes: ( - ) blurriness of vision, ( - ) double vision, ( - ) watery eyes Ears, nose, mouth, throat, and face: ( - ) mucositis, ( - ) sore throat Respiratory: ( - ) cough, ( - ) dyspnea, ( - ) wheezes Cardiovascular: ( - ) palpitation, ( - ) chest discomfort, ( - ) lower extremity swelling Gastrointestinal:  ( - ) nausea, ( - ) heartburn, ( - ) change in bowel habits Skin: ( - ) abnormal skin rashes Lymphatics: ( - ) new lymphadenopathy, ( - ) easy bruising Neurological: ( - ) numbness, ( - ) tingling, ( - ) new weaknesses Behavioral/Psych: ( - ) mood change, ( - ) new changes  All other systems were reviewed with the patient and are negative.  PHYSICAL EXAMINATION: ECOG PERFORMANCE STATUS: 1 -  Symptomatic but completely ambulatory  Vitals:   05/21/22 0822  BP: 134/72  Pulse: 61  Resp: 15  Temp: (!) 97.5 F (36.4 C)  SpO2: 100%   Filed Weights   05/21/22 0822  Weight: 237 lb (107.5 kg)    GENERAL: well appearing male in NAD  SKIN: skin color, texture, turgor are normal, no rashes or significant lesions EYES: conjunctiva are pink and non-injected, sclera  clear LUNGS: clear to auscultation and percussion with normal breathing effort HEART: regular rate & rhythm and no murmurs  Musculoskeletal: no cyanosis of digits and no clubbing  PSYCH: alert & oriented x 3, fluent speech NEURO: no focal motor/sensory deficits  LABORATORY DATA:  I have reviewed the data as listed    Latest Ref Rng & Units 05/21/2022    7:21 AM 11/20/2021    7:49 AM 11/08/2021    8:06 AM  CBC  WBC 4.0 - 10.5 K/uL 4.4  4.1  3.7   Hemoglobin 13.0 - 17.0 g/dL 14.9  15.2  15.5   Hematocrit 39.0 - 52.0 % 44.8  45.8  47.5   Platelets 150 - 400 K/uL 255  217  252.0        Latest Ref Rng & Units 05/21/2022    7:21 AM 09/20/2021    9:11 AM 08/17/2020    9:48 AM  CMP  Glucose 70 - 99 mg/dL 94  91  96   BUN 6 - 20 mg/dL 14  20  12    Creatinine 0.61 - 1.24 mg/dL 1.35  1.26  1.13   Sodium 135 - 145 mmol/L 140  138  141   Potassium 3.5 - 5.1 mmol/L 3.9  4.0  4.1   Chloride 98 - 111 mmol/L 107  103  106   CO2 22 - 32 mmol/L 27  24  28    Calcium 8.9 - 10.3 mg/dL 9.5  9.9  9.5   Total Protein 6.5 - 8.1 g/dL 7.3  7.8  7.2   Total Bilirubin 0.3 - 1.2 mg/dL 0.7  0.8  0.8   Alkaline Phos 38 - 126 U/L 45  33  45   AST 15 - 41 U/L 45  31  40   ALT 0 - 44 U/L 33  28  39     ASSESSMENT & PLAN Grant Miller is a 47 y.o. male who returns for a follow up for neutropenia.  #Neutropenia: -- Benign ethnic neutropenia. Levels have fluctuated over the last several years. -- Labs today were reviewed which show no evidence of leukopenia or neutropenia. WBC 4.4, ANC 1.9. -- Discussed that patient can move forward with  surveillance with his PCP to check his CBC annually.  This was Dr. Hazeline Junker recommendation at his last visit as well. --If patient's neutropenia returns with ANC less than 1000, we are happy to see the patient again for further evaluation.  #Health maintenance:  --Patient underwent screening colonoscopy in 2022, with 6 polyps removed.   No orders of the defined types were placed in this encounter.   All questions were answered. The patient knows to call the clinic with any problems, questions or concerns.  I have spent a total of 25 minutes minutes of face-to-face and non-face-to-face time, preparing to see the patient,  performing a medically appropriate examination, counseling and educating the patient, documenting clinical information in the electronic health record,and care coordination.   Dede Query, PA-C Department of Hematology/Oncology [place] at [facility] Phone: [phone]

## 2022-05-31 ENCOUNTER — Other Ambulatory Visit: Payer: Self-pay | Admitting: Internal Medicine

## 2022-06-03 ENCOUNTER — Telehealth: Payer: Self-pay | Admitting: Internal Medicine

## 2022-06-03 NOTE — Telephone Encounter (Signed)
Patient called and said he has lower back pain, which is something he has been seen for before. He wants to get a refill on the pain medication he's been prescribed, but does not remember the name. He would like a call back at [phone].

## 2022-06-04 NOTE — Telephone Encounter (Signed)
We would need to know which medication he is requesting I do not see that information

## 2022-06-05 ENCOUNTER — Other Ambulatory Visit: Payer: Self-pay

## 2022-06-05 MED ORDER — MELOXICAM 15 MG PO TABS: 15.0000 mg | ORAL_TABLET | Freq: Every day | ORAL | 1 refills | Status: DC

## 2022-06-05 NOTE — Telephone Encounter (Signed)
Ok to refill meloxicam. We are unable to prescribe him percocet. We are happy to get him back in with sports medicine or orthopedics to assess his condition if he needs.

## 2022-06-05 NOTE — Telephone Encounter (Signed)
Meloxicam has been refilled  

## 2022-06-05 NOTE — Telephone Encounter (Signed)
Patient said that the tosterone rx was not received by [facility] on 06/03/2022 - Please resend script.

## 2022-06-07 ENCOUNTER — Other Ambulatory Visit: Payer: Self-pay | Admitting: Internal Medicine

## 2022-06-10 ENCOUNTER — Telehealth: Payer: Self-pay | Admitting: Internal Medicine

## 2022-06-10 MED ORDER — TESTOSTERONE CYPIONATE 200 MG/ML IM SOLN: 100.0000 mg | INTRAMUSCULAR | 0 refills | Status: DC

## 2022-06-10 NOTE — Telephone Encounter (Signed)
Do we know if he checked with correct pharmacy? I do not see this documented

## 2022-06-10 NOTE — Telephone Encounter (Signed)
This is a controlled substance so if someone refilled in my name this would not be valid. I have refilled today.

## 2022-06-10 NOTE — Telephone Encounter (Signed)
Patient called and said his pharmacy hasn't received the prescription for his testosterone cypionate (DEPOTESTOSTERONE CYPIONATE) 200 MG/ML injection. It was originally sent 06/03/22, but the pharmacy told the patient the don't have the prescription. He would like to know if it can be resent to the pharmacy because he his completely out of his medication. Best callback is [phone].

## 2022-07-03 ENCOUNTER — Ambulatory Visit: Payer: 59 | Admitting: Sports Medicine

## 2022-07-18 ENCOUNTER — Telehealth: Payer: Self-pay

## 2022-07-18 NOTE — Telephone Encounter (Signed)
Contacted Shirlee Limerick to schedule their annual wellness visit. Appointment made for 08/01/22.

## 2022-08-01 ENCOUNTER — Ambulatory Visit (INDEPENDENT_AMBULATORY_CARE_PROVIDER_SITE_OTHER): Payer: 59

## 2022-08-01 VITALS — Ht 71.0 in | Wt 237.0 lb

## 2022-08-01 DIAGNOSIS — Z0000 Encounter for general adult medical examination without abnormal findings: Secondary | ICD-10-CM | POA: Diagnosis not present

## 2022-08-01 NOTE — Progress Notes (Signed)
Subjective:   Daeron Aardema is a 47 y.o. male who presents for Medicare Annual/Subsequent preventive examination.  Review of Systems    Virtual Visit via Telephone Note  I connected with  Shirlee Limerick on 08/01/22 at  9:00 AM EDT by telephone and verified that I am speaking with the correct person using two identifiers.  Location: Patient: Home Provider: Office Persons participating in the virtual visit: patient/Nurse Health Advisor   I discussed the limitations, risks, security and privacy concerns of performing an evaluation and management service by telephone and the availability of in person appointments. The patient expressed understanding and agreed to proceed.  Interactive audio and video telecommunications were attempted between this nurse and patient, however failed, due to patient having technical difficulties OR patient did not have access to video capability.  We continued and completed visit with audio only.  Some vital signs may be absent or patient reported.   Tillie Rung, LPN  Cardiac Risk Factors include: advanced age (>58men, >44 women);male gender     Objective:    Today's Vitals   08/01/22 1006  Weight: 237 lb (107.5 kg)  Height: 5\' 11"  (1.803 m)   Body mass index is 33.05 kg/m.     08/01/2022   10:16 AM 07/10/2021    4:10 PM 11/28/2020   12:25 PM 08/30/2020   11:36 AM 02/11/2018    8:49 AM 04/16/2017    9:36 PM 02/16/2016    8:21 AM  Advanced Directives  Does Patient Have a Medical Advance Directive? Yes Yes No No No No No  Type of Estate agent of [place];Living will Living will;Healthcare Power of Attorney       Does patient want to make changes to medical advance directive? No - Patient declined        Copy of Healthcare Power of Attorney in Chart? Yes - validated most recent copy scanned in chart (See row information) No - copy requested       Would patient like information on creating a medical advance directive?    No  - Patient declined No - Patient declined No - Patient declined Yes (MAU/Ambulatory/Procedural Areas - Information given)    Current Medications (verified) Outpatient Encounter Medications as of 08/01/2022  Medication Sig   anastrozole (ARIMIDEX) 1 MG tablet Take 1 mg by mouth once a week. Saturdays   BIOTIN PO Take 1 tablet by mouth daily.   Cholecalciferol (D3 PO) Take 1 capsule by mouth daily.   DULoxetine (CYMBALTA) 30 MG capsule Take 1 capsule (30 mg total) by mouth daily.   meloxicam (MOBIC) 15 MG tablet Take 1 tablet (15 mg total) by mouth daily.   methocarbamol (ROBAXIN) 500 MG tablet Take 1 tablet (500 mg total) by mouth 3 (three) times daily as needed for muscle spasms.   Multiple Vitamins-Minerals (ZINC PO) Take 1 tablet by mouth daily.   sildenafil (VIAGRA) 100 MG tablet Take 0.5-1 tablets (50-100 mg total) by mouth daily as needed for erectile dysfunction.   Syringe/Needle, Disp, (SYRINGE 3CC/20GX1") 20G X 1" 3 ML MISC Use weekly with testosterone   testosterone cypionate (DEPOTESTOSTERONE CYPIONATE) 200 MG/ML injection Inject 0.5 mLs (100 mg total) into the muscle every 7 (seven) days.   No facility-administered encounter medications on file as of 08/01/2022.    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    Family History  Problem Relation Age of Onset   Heart disease Mother    Hyperlipidemia Mother    Kidney disease Mother    Diabetes Mother    Colon cancer Neg Hx    Esophageal cancer Neg Hx    Rectal cancer Neg Hx    Stomach cancer Neg Hx    Social History   Socioeconomic History   Marital status: Single    Spouse name: Not on file   Number of children: Not on file   Years of education: Not on file   Highest education  level: Not on file  Occupational History   Not on file  Tobacco Use   Smoking status: Former    Types: Cigarettes    Quit date: 2003    Years since quitting: 21.4   Smokeless tobacco: Never  Vaping Use   Vaping Use: Never used  Substance and Sexual Activity   Alcohol use: No   Drug use: No   Sexual activity: Not on file  Other Topics Concern   Not on file  Social History Narrative   Not on file   Social Determinants of Health   Financial Resource Strain: Low Risk  (08/01/2022)   Overall Financial Resource Strain (CARDIA)    Difficulty of Paying Living Expenses: Not hard at all  Food Insecurity: No Food Insecurity (08/01/2022)   Hunger Vital Sign    Worried About Running Out of Food in the Last Year: Never true    Ran Out of Food in the Last Year: Never true  Transportation Needs: No Transportation Needs (08/01/2022)   PRAPARE - Administrator, Civil Service (Medical): No    Lack of Transportation (Non-Medical): No  Physical Activity: Insufficiently Active (08/01/2022)   Exercise Vital Sign    Days of Exercise per Week: 3 days    Minutes of Exercise per Session: 30 min  Stress: No Stress Concern Present (08/01/2022)   [employer] of Occupational Health - Occupational Stress Questionnaire    Feeling of Stress : Not at all  Social Connections: Moderately Integrated (08/01/2022)   Social Connection and Isolation Panel [NHANES]    Frequency of Communication with Friends and Family: More than three times a week    Frequency of Social Gatherings with Friends and Family: More than three times a week    Attends Religious Services: More than 4 times per year    Active Member of [employer] or Organizations: Yes    Attends Engineer, structural: More than 4 times per year    Marital Status: Never married    Tobacco Counseling Counseling given: Not Answered   Clinical Intake:  Pre-visit preparation completed: No  Pain : No/denies pain     BMI - recorded:  33.05 Nutritional Status: BMI > 30  Obese Nutritional Risks: None Diabetes: No  How often do you need to have someone help you when you read instructions, pamphlets, or other written materials from your doctor or pharmacy?: 1 - Never  Diabetic?  Pre Diabetic  Interpreter Needed?: No  Information entered by :: Theresa Mulligan LPN   Activities of Daily Living    08/01/2022   10:14 AM  In your present state of health, do you have any difficulty performing the following activities:  Hearing? 0  Vision? 0  Difficulty  concentrating or making decisions? 0  Walking or climbing stairs? 0  Dressing or bathing? 0  Doing errands, shopping? 0  Preparing Food and eating ? N  Using the Toilet? N  In the past six months, have you accidently leaked urine? N  Do you have problems with loss of bowel control? N  Managing your Medications? N  Managing your Finances? N  Housekeeping or managing your Housekeeping? N    Patient Care Team: Myrlene Broker, MD as PCP - General (Internal Medicine)  Indicate any recent Medical Services you may have received from other than [facility] providers in the past year (date may be approximate).     Assessment:   This is a routine wellness examination for Nickie.  Hearing/Vision screen Hearing Screening - Comments:: Denies hearing difficulties   Vision Screening - Comments:: Wears rx glasses - up to date with routine eye exams with  Deferred  Dietary issues and exercise activities discussed: Current Exercise Habits: Home exercise routine, Type of exercise: walking, Time (Minutes): 30, Frequency (Times/Week): 3, Weekly Exercise (Minutes/Week): 90, Intensity: Moderate, Exercise limited by: None identified   Goals Addressed               This Visit's Progress     Stay healthy (pt-stated)        Get rich!       Depression Screen    08/01/2022   10:12 AM 03/18/2022   10:10 AM 03/18/2022   10:08 AM 03/18/2022   10:07 AM 11/08/2021    8:43 AM  07/10/2021    4:05 PM 08/17/2020    4:03 PM  PHQ 2/9 Scores  PHQ - 2 Score 0 5 0 0 0 4 2  PHQ- 9 Score  18 0   14 12    Fall Risk    08/01/2022   10:15 AM 03/18/2022   10:10 AM 03/18/2022   10:07 AM 11/08/2021    8:43 AM 09/20/2021    8:56 AM  Fall Risk   Falls in the past year? 0 0 0 0 0  Number falls in past yr: 0 0 0 0 0  Injury with Fall? 0 0 0 0 0  Risk for fall due to : No Fall Risks   No Fall Risks   Follow up Falls prevention discussed Falls evaluation completed Falls evaluation completed Falls evaluation completed     FALL RISK PREVENTION PERTAINING TO THE HOME:  Any stairs in or around the home? Yes  If so, are there any without handrails? No  Home free of loose throw rugs in walkways, pet beds, electrical cords, etc? Yes Adequate lighting in your home to reduce risk of falls? Yes   ASSISTIVE DEVICES UTILIZED TO PREVENT FALLS:  Life alert? No  Use of a cane, walker or w/c? No  Grab bars in the bathroom? No  Shower chair or bench in shower? No  Elevated toilet seat or a handicapped toilet? No   TIMED UP AND GO:  Was the test performed? No . Audio visit   Cognitive Function:        08/01/2022   10:16 AM 07/10/2021    4:14 PM  6CIT Screen  What Year? 0 points 0 points  What month? 0 points 0 points  What time? 0 points 0 points  Count back from 20 0 points 0 points  Months in reverse 0 points 0 points  Repeat phrase 2 points 0 points  Total Score 2 points 0 points  Immunizations Immunization History  Administered Date(s) Administered   PFIZER(Purple Top)SARS-COV-2 Vaccination 06/28/2019, 07/19/2019    TDAP status: Due, Education has been provided regarding the importance of this vaccine. Advised may receive this vaccine at local pharmacy or Health Dept. Aware to provide a copy of the vaccination record if obtained from local pharmacy or Health Dept. Verbalized acceptance and understanding.  Flu Vaccine status: Up to date  Colonoscopy completed  01/17/21 next due 5 yrs  Covid-19 vaccine status: Completed vaccines  Qualifies for Shingles Vaccine? No   Zostavax completed No   Shingrix Completed?: No.    Education has been provided regarding the importance of this vaccine. Patient has been advised to call insurance company to determine out of pocket expense if they have not yet received this vaccine. Advised may also receive vaccine at local pharmacy or Health Dept. Verbalized acceptance and understanding.  Screening Tests Health Maintenance  Topic Date Due   DTaP/Tdap/Td (1 - Tdap) Never done   COVID-19 Vaccine (3 - 2023-24 season) 08/17/2022 (Originally 11/02/2021)   Hepatitis C Screening  08/01/2023 (Originally 01/03/1992)   HIV Screening  08/01/2023 (Originally 01/02/1989)   INFLUENZA VACCINE  10/03/2022   Medicare Annual Wellness (AWV)  08/01/2023   Colonoscopy  01/17/2026   HPV VACCINES  Aged Out    Health Maintenance  Health Maintenance Due  Topic Date Due   DTaP/Tdap/Td (1 - Tdap) Never done      Lung Cancer Screening: (Low Dose CT Chest recommended if Age 47-80 years, 30 pack-year currently smoking OR have quit w/in 15years.) does not qualify.     Additional Screening:  Hepatitis C Screening: does qualify; Completed Deferred  Vision Screening: Recommended annual ophthalmology exams for early detection of glaucoma and other disorders of the eye. Is the patient up to date with their annual eye exam?  No  Who is the provider or what is the name of the office in which the patient attends annual eye exams? Deferred If pt is not established with a provider, would they like to be referred to a provider to establish care? No .   Dental Screening: Recommended annual dental exams for proper oral hygiene  Community Resource Referral / Chronic Care Management:  CRR required this visit?  No   CCM required this visit?  No      Plan:     I have personally reviewed and noted the following in the patient's chart:    Medical and social history Use of alcohol, tobacco or illicit drugs  Current medications and supplements including opioid prescriptions. Patient is not currently taking opioid prescriptions. Functional ability and status Nutritional status Physical activity Advanced directives List of other physicians Hospitalizations, surgeries, and ER visits in previous 12 months Vitals Screenings to include cognitive, depression, and falls Referrals and appointments  In addition, I have reviewed and discussed with patient certain preventive protocols, quality metrics, and best practice recommendations. A written personalized care plan for preventive services as well as general preventive health recommendations were provided to patient.     Tillie Rung, LPN   1/61/0960   Nurse Notes: Patient due HIV and Hep-C Screening

## 2022-08-01 NOTE — Patient Instructions (Addendum)
Mr. Grant Miller , Thank you for taking time to come for your Medicare Wellness Visit. I appreciate your ongoing commitment to your health goals. Please review the following plan we discussed and let me know if I can assist you in the future.   These are the goals we discussed:  Goals       Stay healthy (pt-stated)      Get rich!      To join the gym and get more physically active.        This is a list of the screening recommended for you and due dates:  Health Maintenance  Topic Date Due   DTaP/Tdap/Td vaccine (1 - Tdap) Never done   COVID-19 Vaccine (3 - 2023-24 season) 08/17/2022*   Hepatitis C Screening  08/01/2023*   HIV Screening  08/01/2023*   Flu Shot  10/03/2022   Medicare Annual Wellness Visit  08/01/2023   Colon Cancer Screening  01/17/2026   HPV Vaccine  Aged Out  *Topic was postponed. The date shown is not the original due date.    Advanced directives: In chart  Conditions/risks identified: None  Next appointment: Follow up in one year for your annual wellness visit   Preventive Care 40-64 Years, Male Preventive care refers to lifestyle choices and visits with your health care provider that can promote health and wellness. What does preventive care include? A yearly physical exam. This is also called an annual well check. Dental exams once or twice a year. Routine eye exams. Ask your health care provider how often you should have your eyes checked. Personal lifestyle choices, including: Daily care of your teeth and gums. Regular physical activity. Eating a healthy diet. Avoiding tobacco and drug use. Limiting alcohol use. Practicing safe sex. Taking low-dose aspirin every day starting at age 25. What happens during an annual well check? The services and screenings done by your health care provider during your annual well check will depend on your age, overall health, lifestyle risk factors, and family history of disease. Counseling  Your health care provider  may ask you questions about your: Alcohol use. Tobacco use. Drug use. Emotional well-being. Home and relationship well-being. Sexual activity. Eating habits. Work and work Astronomer. Screening  You may have the following tests or measurements: Height, weight, and BMI. Blood pressure. Lipid and cholesterol levels. These may be checked every 5 years, or more frequently if you are over 64 years old. Skin check. Lung cancer screening. You may have this screening every year starting at age 22 if you have a 30-pack-year history of smoking and currently smoke or have quit within the past 15 years. Fecal occult blood test (FOBT) of the stool. You may have this test every year starting at age 2. Flexible sigmoidoscopy or colonoscopy. You may have a sigmoidoscopy every 5 years or a colonoscopy every 10 years starting at age 66. Prostate cancer screening. Recommendations will vary depending on your family history and other risks. Hepatitis C blood test. Hepatitis B blood test. Sexually transmitted disease (STD) testing. Diabetes screening. This is done by checking your blood sugar (glucose) after you have not eaten for a while (fasting). You may have this done every 1-3 years. Discuss your test results, treatment options, and if necessary, the need for more tests with your health care provider. Vaccines  Your health care provider may recommend certain vaccines, such as: Influenza vaccine. This is recommended every year. Tetanus, diphtheria, and acellular pertussis (Tdap, Td) vaccine. You may need a Td booster  every 10 years. Zoster vaccine. You may need this after age 52. Pneumococcal 13-valent conjugate (PCV13) vaccine. You may need this if you have certain conditions and have not been vaccinated. Pneumococcal polysaccharide (PPSV23) vaccine. You may need one or two doses if you smoke cigarettes or if you have certain conditions. Talk to your health care provider about which screenings and  vaccines you need and how often you need them. This information is not intended to replace advice given to you by your health care provider. Make sure you discuss any questions you have with your health care provider. Document Released: 03/17/2015 Document Revised: 11/08/2015 Document Reviewed: 12/20/2014 Elsevier Interactive Patient Education  2017 [employer].  Fall Prevention in the Home Falls can cause injuries. They can happen to people of all ages. There are many things you can do to make your home safe and to help prevent falls. What can I do on the outside of my home? Regularly fix the edges of walkways and driveways and fix any cracks. Remove anything that might make you trip as you walk through a door, such as a raised step or threshold. Trim any bushes or trees on the path to your home. Use bright outdoor lighting. Clear any walking paths of anything that might make someone trip, such as rocks or tools. Regularly check to see if handrails are loose or broken. Make sure that both sides of any steps have handrails. Any raised decks and porches should have guardrails on the edges. Have any leaves, snow, or ice cleared regularly. Use sand or salt on walking paths during winter. Clean up any spills in your garage right away. This includes oil or grease spills. What can I do in the bathroom? Use night lights. Install grab bars by the toilet and in the tub and shower. Do not use towel bars as grab bars. Use non-skid mats or decals in the tub or shower. If you need to sit down in the shower, use a plastic, non-slip stool. Keep the floor dry. Clean up any water that spills on the floor as soon as it happens. Remove soap buildup in the tub or shower regularly. Attach bath mats securely with double-sided non-slip rug tape. Do not have throw rugs and other things on the floor that can make you trip. What can I do in the bedroom? Use night lights. Make sure that you have a light by your  bed that is easy to reach. Do not use any sheets or blankets that are too big for your bed. They should not hang down onto the floor. Have a firm chair that has side arms. You can use this for support while you get dressed. Do not have throw rugs and other things on the floor that can make you trip. What can I do in the kitchen? Clean up any spills right away. Avoid walking on wet floors. Keep items that you use a lot in easy-to-reach places. If you need to reach something above you, use a strong step stool that has a grab bar. Keep electrical cords out of the way. Do not use floor polish or wax that makes floors slippery. If you must use wax, use non-skid floor wax. Do not have throw rugs and other things on the floor that can make you trip. What can I do with my stairs? Do not leave any items on the stairs. Make sure that there are handrails on both sides of the stairs and use them. Fix handrails that are  broken or loose. Make sure that handrails are as long as the stairways. Check any carpeting to make sure that it is firmly attached to the stairs. Fix any carpet that is loose or worn. Avoid having throw rugs at the top or bottom of the stairs. If you do have throw rugs, attach them to the floor with carpet tape. Make sure that you have a light switch at the top of the stairs and the bottom of the stairs. If you do not have them, ask someone to add them for you. What else can I do to help prevent falls? Wear shoes that: Do not have high heels. Have rubber bottoms. Are comfortable and fit you well. Are closed at the toe. Do not wear sandals. If you use a stepladder: Make sure that it is fully opened. Do not climb a closed stepladder. Make sure that both sides of the stepladder are locked into place. Ask someone to hold it for you, if possible. Clearly mark and make sure that you can see: Any grab bars or handrails. First and last steps. Where the edge of each step is. Use tools that  help you move around (mobility aids) if they are needed. These include: Canes. Walkers. Scooters. Crutches. Turn on the lights when you go into a dark area. Replace any light bulbs as soon as they burn out. Set up your furniture so you have a clear path. Avoid moving your furniture around. If any of your floors are uneven, fix them. If there are any pets around you, be aware of where they are. Review your medicines with your doctor. Some medicines can make you feel dizzy. This can increase your chance of falling. Ask your doctor what other things that you can do to help prevent falls. This information is not intended to replace advice given to you by your health care provider. Make sure you discuss any questions you have with your health care provider. Document Released: 12/15/2008 Document Revised: 07/27/2015 Document Reviewed: 03/25/2014 Elsevier Interactive Patient Education  2017 [employer].

## 2022-08-08 ENCOUNTER — Other Ambulatory Visit: Payer: Self-pay | Admitting: Internal Medicine

## 2022-08-27 ENCOUNTER — Encounter (HOSPITAL_COMMUNITY): Payer: Self-pay

## 2022-08-27 ENCOUNTER — Emergency Department (HOSPITAL_COMMUNITY): Payer: 59

## 2022-08-27 ENCOUNTER — Emergency Department (HOSPITAL_COMMUNITY)
Admission: EM | Admit: 2022-08-27 | Discharge: 2022-08-27 | Disposition: A | Discharge status: Home / Self Care | Payer: 59 | Attending: Emergency Medicine | Admitting: Emergency Medicine

## 2022-08-27 ENCOUNTER — Other Ambulatory Visit: Payer: Self-pay

## 2022-08-27 DIAGNOSIS — M5442 Lumbago with sciatica, left side: Secondary | ICD-10-CM | POA: Insufficient documentation

## 2022-08-27 DIAGNOSIS — R9389 Abnormal findings on diagnostic imaging of other specified body structures: Secondary | ICD-10-CM | POA: Insufficient documentation

## 2022-08-27 DIAGNOSIS — G8929 Other chronic pain: Secondary | ICD-10-CM | POA: Insufficient documentation

## 2022-08-27 DIAGNOSIS — R1032 Left lower quadrant pain: Secondary | ICD-10-CM | POA: Insufficient documentation

## 2022-08-27 DIAGNOSIS — R93429 Abnormal radiologic findings on diagnostic imaging of unspecified kidney: Secondary | ICD-10-CM | POA: Insufficient documentation

## 2022-08-27 DIAGNOSIS — M545 Low back pain, unspecified: Secondary | ICD-10-CM | POA: Diagnosis present

## 2022-08-27 LAB — URINALYSIS, ROUTINE W REFLEX MICROSCOPIC
Bilirubin Urine: NEGATIVE
Glucose, UA: NEGATIVE mg/dL
Hgb urine dipstick: NEGATIVE
Ketones, ur: NEGATIVE mg/dL
Leukocytes,Ua: NEGATIVE
Nitrite: NEGATIVE
Protein, ur: NEGATIVE mg/dL
Specific Gravity, Urine: 1.018 (ref 1.005–1.030)
pH: 5 (ref 5.0–8.0)

## 2022-08-27 MED ORDER — KETOROLAC TROMETHAMINE 15 MG/ML IJ SOLN
30.0000 mg | Freq: Once | INTRAMUSCULAR | Status: AC
  Administered 2022-08-27: 30 mg via INTRAMUSCULAR
  Filled 2022-08-27: qty 2

## 2022-08-27 MED ORDER — OXYCODONE-ACETAMINOPHEN 5-325 MG PO TABS: 1.0000 | ORAL_TABLET | Freq: Four times a day (QID) | ORAL | 0 refills | Status: DC | PRN

## 2022-08-27 NOTE — Discharge Instructions (Addendum)
You were evaluated here today for back pain that radiates into your left groin.  No definite source of pain was found.  However this could be part of your ongoing low back pain. You were evaluated with a CAT scan and it is noted that you have a kidney stone in your left kidney.  This is unlikely to be causing the pain itself, but you could have recently passed a kidney stone. Your urine was clear no evidence of infection was noted Incidentally on your CAT scans there was a lesion noted in your left kidney and left adrenal.  Please follow-up with your doctor regarding further evaluation of these. You had some small bowel loops in the left lower quadrant of your abdomen.  However, I do not think that these are clinically relevant given the fact that you are not having nausea, vomiting, difficulty having bowel movements or any problems with eating or drinking.  However if any of the symptoms worsen, or recur, please return for evaluation.

## 2022-08-27 NOTE — ED Triage Notes (Signed)
Patient reports lower back pain for a while, had MRI yesterday morning, last night states he had sharp pain and felt like his back "froze up." States pain went around from his back to testicle. Pain 8/10 currently. Denies any recent injury. Has taken muscle relaxer with no relief.

## 2022-08-27 NOTE — ED Notes (Signed)
Light green and lavender sent down

## 2022-08-27 NOTE — ED Provider Notes (Signed)
[facility] AT [facility] Provider Note   CSN: [id number] Arrival date & time: 08/27/22  0353     History  Chief Complaint  Patient presents with   Back Pain    Grant Miller is a 47 y.o. male.  HPI 47 year old male presents today complaining of back pain that is radiating into the left lower abdomen and testicle.  He reports that he has a long history of back pain.  He has recently had an MRI.  However with the pain that began radiating into the testicle is a new type pain.  It began yesterday.  He states that he was voiding and felt a popping sensation.  He denies any blood in his urine, abdominal pain, worsening with food intake, or need to get in a different position.  He endorses the pain is worse with movement.  He took a muscle relaxant with no relief.     Home Medications Prior to Admission medications   Medication Sig Start Date End Date Taking? Authorizing Provider  oxyCODONE-acetaminophen (PERCOCET/ROXICET) 5-325 MG tablet Take 1 tablet by mouth every 6 (six) hours as needed for severe pain. 08/27/22  Yes Margarita Grizzle, MD  anastrozole (ARIMIDEX) 1 MG tablet Take 1 mg by mouth once a week. Saturdays 11/14/20   [provider]  BIOTIN PO Take 1 tablet by mouth daily.    [provider]  Cholecalciferol (D3 PO) Take 1 capsule by mouth daily.    [provider]  DULoxetine (CYMBALTA) 30 MG capsule Take 1 capsule (30 mg total) by mouth daily. 06/18/21   Myrlene Broker, MD  meloxicam (MOBIC) 15 MG tablet Take 1 tablet (15 mg total) by mouth daily. 06/05/22   Myrlene Broker, MD  methocarbamol (ROBAXIN) 500 MG tablet Take 1 tablet (500 mg total) by mouth 3 (three) times daily as needed for muscle spasms. 09/20/21   Myrlene Broker, MD  Multiple Vitamins-Minerals (ZINC PO) Take 1 tablet by mouth daily.    [provider]  sildenafil (VIAGRA) 100 MG tablet Take 0.5-1 tablets (50-100 mg total) by mouth  daily as needed for erectile dysfunction. 09/20/21   Myrlene Broker, MD  Syringe/Needle, Disp, (SYRINGE 3CC/20GX1") 20G X 1" 3 ML MISC Use weekly with testosterone 06/18/21   Myrlene Broker, MD  testosterone cypionate (DEPOTESTOSTERONE CYPIONATE) 200 MG/ML injection ADMINISTER 0.5 ML(100 MG) IN THE MUSCLE EVERY 7 DAYS 08/09/22   Myrlene Broker, MD      Allergies    Iodine, Other, and Shellfish allergy    Review of Systems   Review of Systems  Physical Exam Updated Vital Signs BP (!) 158/97   Pulse 67   Temp 97.7 F (36.5 C) (Oral)   Resp 14   Ht 1.829 m (6')   Wt 106.6 kg   SpO2 99%   BMI 31.87 kg/m  Physical Exam Vitals reviewed.  HENT:     Head: Normocephalic.     Right Ear: External ear normal.     Left Ear: External ear normal.     Nose: Nose normal.     Mouth/Throat:     Pharynx: Oropharynx is clear.  Eyes:     Conjunctiva/sclera: Conjunctivae normal.  Cardiovascular:     Rate and Rhythm: Normal rate and regular rhythm.     Pulses: Normal pulses.  Pulmonary:     Effort: Pulmonary effort is normal.  Abdominal:     General: Abdomen is flat. Bowel sounds are normal.  Palpations: Abdomen is soft.  Genitourinary:    Penis: Normal.      Testes: Normal.  Musculoskeletal:        General: Normal range of motion.     Cervical back: Normal range of motion.  Skin:    General: Skin is warm and dry.     Capillary Refill: Capillary refill takes less than 2 seconds.  Neurological:     General: No focal deficit present.     Mental Status: He is alert.  Psychiatric:        Mood and Affect: Mood normal.     ED Results / Procedures / Treatments   Labs (all labs ordered are listed, but only abnormal results are displayed) Labs Reviewed  URINALYSIS, ROUTINE W REFLEX MICROSCOPIC    EKG None  Radiology CT Renal Stone Study  Result Date: 08/27/2022 CLINICAL DATA:  47 year old male with history of back pain with extension into the testicle.  Suspected stone. EXAM: CT ABDOMEN AND PELVIS WITHOUT CONTRAST TECHNIQUE: Multidetector CT imaging of the abdomen and pelvis was performed following the standard protocol without IV contrast. RADIATION DOSE REDUCTION: This exam was performed according to the departmental dose-optimization program which includes automated exposure control, adjustment of the mA and/or kV according to patient size and/or use of iterative reconstruction technique. COMPARISON:  None available. FINDINGS: Lower chest: Unremarkable. Hepatobiliary: Liver has an irregular contour, suggesting underlying cirrhosis. No definite suspicious cystic or solid hepatic lesions are confidently identified on today's noncontrast CT examination. Unenhanced appearance of the gallbladder is unremarkable. Pancreas: No definite pancreatic mass or peripancreatic fluid collections or inflammatory changes are noted on today's noncontrast CT examination. Spleen: Unremarkable. Adrenals/Urinary Tract: Left-sided pelvic kidney (normal anatomical variant) incidentally noted. In the left renal collecting system (axial image 60 of series 2) there is a 4 mm nonobstructive calculus. No additional calculi are noted within the right renal collecting system, along the course of either ureter, or within the lumen of the urinary bladder. No hydroureteronephrosis associated with either kidney. Unenhanced appearance of the right kidney is unremarkable. In the posterior aspect of the left kidney (axial image 63 of series 2) there is an intermediate attenuation (25 HU) lesion, which is indeterminate. Unenhanced appearance of the urinary bladder is unremarkable. Right adrenal gland is normal in appearance. 1.2 cm intermediate attenuation (28 HU) left adrenal nodule (axial image 26 of series 2). Stomach/Bowel: The unenhanced appearance of the stomach is normal. Several acutely angulated mildly dilated loops of small bowel are noted in the left side of the abdomen measuring up to 3.3  cm in diameter, with some small air-fluid levels in this region. No definitive transition point confidently identified. Normal appendix. Vascular/Lymphatic: Atherosclerosis in the abdominal aorta and pelvic vasculature. No lymphadenopathy noted in the abdomen or pelvis. Reproductive: Prostate gland and seminal vesicles are unremarkable in appearance. Other: No significant volume of ascites.  No pneumoperitoneum. Musculoskeletal: There are no aggressive appearing lytic or blastic lesions noted in the visualized portions of the skeleton. IMPRESSION: 1. Multiple mildly dilated loops of small bowel in the left side of the abdomen. These appear acutely angulated, and could be related to underlying adhesions. The possibility of early or partial small bowel obstruction is not excluded, and in the appropriate clinical setting, surgical consultation should be considered. 2. 4 mm nonobstructive calculus in the collecting system of the patient's left pelvic kidney. No ureteral stones or findings of urinary tract obstruction. 3. 2.5 cm intermediate attenuation (25 HU) lesion in the patient's left pelvic  kidney. This is favored to represent a small proteinaceous cyst, but could be definitively characterized with nonemergent MRI of the pelvis with and without IV gadolinium if of clinical concern. 4. 1.2 cm indeterminate left adrenal nodule, statistically likely a benign lesion such as a lipid poor adenoma. In the absence of known primary malignancy, a metastatic lesion is unlikely. Follow-up adrenal protocol CT scan could be considered in 12 months to re-evaluate this finding and provide definitive characterization. This recommendation follows ACR consensus guidelines: Management of Incidental Adrenal Masses: A White Paper of the ACR Incidental Findings Committee. J Am Coll Radiol 2017;14:1038-1044. 5. Aortic atherosclerosis. 6. Morphologic changes in the liver suggestive of cirrhosis. Electronically Signed   By: Trudie Reed  M.D.   On: 08/27/2022 07:55    Procedures Procedures    Medications Ordered in ED Medications  ketorolac (TORADOL) 15 MG/ML injection 30 mg (30 mg Intramuscular Given 08/27/22 0813)    ED Course/ Medical Decision Making/ A&P Clinical Course as of 08/27/22 0905  Tue Aug 27, 2022  0818 CT is reviewed and interpreted and is significant for multiple mildly dilated loops of small bowel in the left side of the abdomen, 4 mm nonobstructive calculus in the collecting system of the patient's left pelvic kidney, 2.5 cm intermediate attenuation lesion in the patient's left kidney favored to represent a cyst, 1.2 cm indeterminate left adrenal nodule likely benign lesion, aortic atherosclerosis and changes consistent of cirrhosis [DR]    Clinical Course User Index [DR] Margarita Grizzle, MD                             Medical Decision Making Amount and/or Complexity of Data Reviewed Labs: ordered. Radiology: ordered.  Risk Prescription drug management.  Patient with low back pain radiating into left lower quadrant and testicle Differential diagnosis includes but is not limited to renal colic, etiologies from back including disc and nerve root pain, intra-abdominal and pelvic etiologies including diverticulitis, UTI, testicular etiologies including orchitis and other groin infections or masses Patient evaluated with CT scan There is a 4 mm nonobstructing calculus in the left collecting system making a recently passed stone a possibility although no ureteral stones are currently noted Physical exam shows no testicular abnormalities or other genital or perigenital fluctuance, redness, or masses making this an unlikely etiology Patient has chronic back pain and could have etiology from back, but does not have any acute neurological deficit and has recently had MRI Incidental findings noted 2.5 cm intermediate attenuation lesion of the patient's left pelvic kidney and 1.2 cm indeterminate left adrenal  nodule as well as cirrhosis and aortic atherosclerosis.  Patient is advised of these abnormalities. Patient treated here with Toradol. Urinalysis pending Patient without significant pain relief with Toradol He is not having any symptoms of obstruction such as nausea, vomiting, diarrhea, difficulty with p.o. intake.  His abdomen is soft and nontender.  I have discussed return precautions for bowel obstruction. Patient is driving.  He cannot have anything that will cause him to have decreased LOC here. I discussed that I will send for Percocet to his pharmacy.  He has follow-up with his doctor tomorrow.  We discussed return precautions need for follow-up, specifically regarding abnormalities noted on CT scan patient voices understanding. You were evaluated here today for back pain that radiates into your left groin.  No definite source of pain was found.  However this could be part of your ongoing low back  pain. You were evaluated with a CAT scan and it is noted that you have a kidney stone in your left kidney.  This is unlikely to be causing the pain itself, but you could have recently passed a kidney stone. Your urine was clear no evidence of infection was noted Incidentally on your CAT scans there was a lesion noted in your left kidney and left adrenal.  Please follow-up with your doctor regarding further evaluation of these. You had some small bowel loops in the left lower quadrant of your abdomen.  However, I do not think that these are clinically relevant given the fact that you are not having nausea, vomiting, difficulty having bowel movements or any problems with eating or drinking.  However if any of the symptoms worsen, or recur, please return for evaluation.       Final Clinical Impression(s) / ED Diagnoses Final diagnoses:  Chronic low back pain with left-sided sciatica, unspecified back pain laterality  Left groin pain  Abnormal CT scan, kidney  Abnormal CT scan    Rx / DC  Orders ED Discharge Orders          Ordered    oxyCODONE-acetaminophen (PERCOCET/ROXICET) 5-325 MG tablet  Every 6 hours PRN        08/27/22 0903              Margarita Grizzle, MD 08/27/22 [phone]

## 2022-08-28 ENCOUNTER — Ambulatory Visit (INDEPENDENT_AMBULATORY_CARE_PROVIDER_SITE_OTHER): Payer: 59 | Admitting: Internal Medicine

## 2022-08-28 ENCOUNTER — Telehealth: Payer: Self-pay

## 2022-08-28 ENCOUNTER — Encounter: Payer: Self-pay | Admitting: Internal Medicine

## 2022-08-28 VITALS — BP 130/80 | HR 72 | Temp 98.2°F | Ht 72.0 in | Wt 235.0 lb

## 2022-08-28 DIAGNOSIS — E279 Disorder of adrenal gland, unspecified: Secondary | ICD-10-CM

## 2022-08-28 DIAGNOSIS — R1084 Generalized abdominal pain: Secondary | ICD-10-CM

## 2022-08-28 DIAGNOSIS — N50812 Left testicular pain: Secondary | ICD-10-CM

## 2022-08-28 DIAGNOSIS — N2889 Other specified disorders of kidney and ureter: Secondary | ICD-10-CM

## 2022-08-28 DIAGNOSIS — N289 Disorder of kidney and ureter, unspecified: Secondary | ICD-10-CM

## 2022-08-28 DIAGNOSIS — E669 Obesity, unspecified: Secondary | ICD-10-CM

## 2022-08-28 DIAGNOSIS — R7303 Prediabetes: Secondary | ICD-10-CM

## 2022-08-28 DIAGNOSIS — N50811 Right testicular pain: Secondary | ICD-10-CM

## 2022-08-28 DIAGNOSIS — K7460 Unspecified cirrhosis of liver: Secondary | ICD-10-CM

## 2022-08-28 DIAGNOSIS — D709 Neutropenia, unspecified: Secondary | ICD-10-CM | POA: Diagnosis not present

## 2022-08-28 LAB — CBC WITH DIFFERENTIAL/PLATELET
Basophils Absolute: 0 10*3/uL (ref 0.0–0.1)
Basophils Relative: 0.8 % (ref 0.0–3.0)
Eosinophils Absolute: 0.1 10*3/uL (ref 0.0–0.7)
Eosinophils Relative: 1.7 % (ref 0.0–5.0)
HCT: 49 % (ref 39.0–52.0)
Hemoglobin: 15.7 g/dL (ref 13.0–17.0)
Lymphocytes Relative: 36.6 % (ref 12.0–46.0)
Lymphs Abs: 1.5 10*3/uL (ref 0.7–4.0)
MCHC: 32.1 g/dL (ref 30.0–36.0)
MCV: 86 fl (ref 78.0–100.0)
Monocytes Absolute: 0.3 10*3/uL (ref 0.1–1.0)
Monocytes Relative: 7.8 % (ref 3.0–12.0)
Neutro Abs: 2.2 10*3/uL (ref 1.4–7.7)
Neutrophils Relative %: 53.1 % (ref 43.0–77.0)
Platelets: 236 10*3/uL (ref 150.0–400.0)
RBC: 5.7 Mil/uL (ref 4.22–5.81)
RDW: 14 % (ref 11.5–15.5)
WBC: 4.1 10*3/uL (ref 4.0–10.5)

## 2022-08-28 LAB — LIPID PANEL
Cholesterol: 195 mg/dL (ref 0–200)
HDL: 36.1 mg/dL — ABNORMAL LOW (ref >39.00)
LDL Cholesterol: 144 mg/dL — ABNORMAL HIGH (ref 0–99)
NonHDL: 159.09
Total CHOL/HDL Ratio: 5
Triglycerides: 74 mg/dL (ref 0.0–149.0)
VLDL: 14.8 mg/dL (ref 0.0–40.0)

## 2022-08-28 LAB — COMPREHENSIVE METABOLIC PANEL
ALT: 40 U/L (ref 0–53)
AST: 28 U/L (ref 0–37)
Albumin: 4.6 g/dL (ref 3.5–5.2)
Alkaline Phosphatase: 40 U/L (ref 39–117)
BUN: 13 mg/dL (ref 6–23)
CO2: 27 mEq/L (ref 19–32)
Calcium: 10.3 mg/dL (ref 8.4–10.5)
Chloride: 103 mEq/L (ref 96–112)
Creatinine, Ser: 1.35 mg/dL (ref 0.40–1.50)
GFR: 62.02 mL/min (ref >60.00)
Glucose, Bld: 90 mg/dL (ref 70–99)
Potassium: 4.1 mEq/L (ref 3.5–5.1)
Sodium: 138 mEq/L (ref 135–145)
Total Bilirubin: 0.7 mg/dL (ref 0.2–1.2)
Total Protein: 7.4 g/dL (ref 6.0–8.3)

## 2022-08-28 LAB — HEMOGLOBIN A1C: Hgb A1c MFr Bld: 6 % (ref 4.6–6.5)

## 2022-08-28 MED ORDER — OXYCODONE-ACETAMINOPHEN 5-325 MG PO TABS: 1.0000 | ORAL_TABLET | ORAL | 0 refills | Status: AC | PRN

## 2022-08-28 NOTE — Progress Notes (Unsigned)
   Subjective:   Patient ID: Grant Miller, male    DOB: [birth year], 47 y.o.   MRN: [id number]  HPI The patient is a 47 YO man coming in for ER follow up (new left side/flank/groin pain). He is concerned about some findings on CT from ER which he has questions about. Since he got an MRI of his back this pain in left side has been persistent and severe. Pain goes into his testicles and no discharge or rash on genitals.   Review of Systems  Constitutional:  Positive for activity change and appetite change.  HENT: Negative.    Eyes: Negative.   Respiratory:  Negative for cough, chest tightness and shortness of breath.   Cardiovascular:  Negative for chest pain, palpitations and leg swelling.  Gastrointestinal:  Positive for abdominal pain. Negative for abdominal distention, constipation, diarrhea, nausea and vomiting.  Genitourinary:  Positive for testicular pain. Negative for genital sores, penile pain, penile swelling and scrotal swelling.  Musculoskeletal:  Positive for back pain and myalgias.  Skin: Negative.   Neurological: Negative.   Psychiatric/Behavioral: Negative.      Objective:  Physical Exam Constitutional:      Appearance: He is well-developed.  HENT:     Head: Normocephalic and atraumatic.  Cardiovascular:     Rate and Rhythm: Normal rate and regular rhythm.  Pulmonary:     Effort: Pulmonary effort is normal. No respiratory distress.     Breath sounds: Normal breath sounds. No wheezing or rales.  Abdominal:     General: Bowel sounds are normal. There is no distension.     Palpations: Abdomen is soft.     Tenderness: There is no abdominal tenderness. There is no rebound.  Musculoskeletal:        General: Tenderness present.     Cervical back: Normal range of motion.  Skin:    General: Skin is warm and dry.  Neurological:     Mental Status: He is alert and oriented to person, place, and time.     Coordination: Coordination normal.     Vitals:   08/28/22 0811   BP: 130/80  Pulse: 72  Temp: 98.2 F (36.8 C)  TempSrc: Oral  SpO2: 97%  Weight: 235 lb (106.6 kg)  Height: 6' (1.829 m)    Assessment & Plan:

## 2022-08-28 NOTE — Patient Instructions (Signed)
We are checking the labs and urine today and will get the MRI of the stomach to look at the kidney and adrenal gland.   We have sent in a percocet prescription to help with the pain short term.

## 2022-08-28 NOTE — Transitions of Care (Post Inpatient/ED Visit) (Signed)
   08/28/2022  Name: Grant Miller MRN: [id number] DOB: May 11, 1975  Today's TOC FU Call Status: Today's TOC FU Call Status:: Successful TOC FU Call Competed TOC FU Call Complete Date: 08/28/22  Transition Care Management Follow-up Telephone Call Date of Discharge: 08/27/22 Discharge Facility: [facility]) Type of Discharge: Emergency Department Reason for ED Visit: Other: (lumbago) How have you been since you were released from the hospital?: Same Any questions or concerns?: No  Items Reviewed: Did you receive and understand the discharge instructions provided?: Yes Medications obtained,verified, and reconciled?: Yes (Medications Reviewed) Any new allergies since your discharge?: No Dietary orders reviewed?: Yes Do you have support at home?: No  Medications Reviewed Today: Medications Reviewed Today     Reviewed by Karena Addison, LPN (Licensed Practical Nurse) on 08/28/22 at 1330  Med List Status: <None>   Medication Order Taking? Sig Documenting Provider Last Dose Status Informant  anastrozole (ARIMIDEX) 1 MG tablet [id number] No Take 1 mg by mouth once a week. Saturdays [provider] Taking Active Self  BIOTIN PO [id number] No Take 1 tablet by mouth daily. [provider] Taking Active Self  Cholecalciferol (D3 PO) [id number] No Take 1 capsule by mouth daily. [provider] Taking Active Self  DULoxetine (CYMBALTA) 30 MG capsule [id number] No Take 1 capsule (30 mg total) by mouth daily. Myrlene Broker, MD Taking Active   meloxicam [facility]) 15 MG tablet [id number] No Take 1 tablet (15 mg total) by mouth daily. Myrlene Broker, MD Taking Active   methocarbamol (ROBAXIN) 500 MG tablet [id number] No Take 1 tablet (500 mg total) by mouth 3 (three) times daily as needed for muscle spasms. Myrlene Broker, MD Taking Active   Multiple Vitamins-Minerals (ZINC PO) [id number] No Take 1 tablet by mouth daily. [provider] Taking  Active Self  oxyCODONE-acetaminophen (PERCOCET/ROXICET) 5-325 MG tablet [id number]  Take 1 tablet by mouth every 4 (four) hours as needed for up to 5 days for severe pain. Myrlene Broker, MD  Active   sildenafil (VIAGRA) 100 MG tablet [id number] No Take 0.5-1 tablets (50-100 mg total) by mouth daily as needed for erectile dysfunction. Myrlene Broker, MD Taking Active   Syringe/Needle, Disp, (SYRINGE 3CC/20GX1") 20G X 1" 3 ML MISC [id number] No Use weekly with testosterone Myrlene Broker, MD Taking Active   testosterone cypionate (DEPOTESTOSTERONE CYPIONATE) 200 MG/ML injection [id number] No ADMINISTER 0.5 ML(100 MG) IN THE MUSCLE EVERY 7 DAYS Myrlene Broker, MD Taking Active             Home Care and Equipment/Supplies: Were Home Health Services Ordered?: NA Any new equipment or medical supplies ordered?: NA  Functional Questionnaire: Do you need assistance with bathing/showering or dressing?: No Do you need assistance with meal preparation?: No Do you need assistance with eating?: No Do you have difficulty maintaining continence: No Do you need assistance with getting out of bed/getting out of a chair/moving?: No Do you have difficulty managing or taking your medications?: No  Follow up appointments reviewed: PCP Follow-up appointment confirmed?: No (no avail appt times, sent message to staff to schedule) Specialist Hospital Follow-up appointment confirmed?: NA Do you need transportation to your follow-up appointment?: No Do you understand care options if your condition(s) worsen?: Yes-patient verbalized understanding    SIGNATURE Karena Addison, LPN [facility] Nurse Health Advisor Direct Dial [phone]

## 2022-08-29 ENCOUNTER — Encounter: Payer: Self-pay | Admitting: Internal Medicine

## 2022-08-29 DIAGNOSIS — K7460 Unspecified cirrhosis of liver: Secondary | ICD-10-CM | POA: Insufficient documentation

## 2022-08-29 DIAGNOSIS — E279 Disorder of adrenal gland, unspecified: Secondary | ICD-10-CM | POA: Insufficient documentation

## 2022-08-29 DIAGNOSIS — N50811 Right testicular pain: Secondary | ICD-10-CM | POA: Insufficient documentation

## 2022-08-29 DIAGNOSIS — N289 Disorder of kidney and ureter, unspecified: Secondary | ICD-10-CM | POA: Insufficient documentation

## 2022-08-29 LAB — RPR: RPR Ser Ql: NONREACTIVE

## 2022-08-29 LAB — HIV ANTIBODY (ROUTINE TESTING W REFLEX): HIV 1&2 Ab, 4th Generation: NONREACTIVE

## 2022-08-29 NOTE — Assessment & Plan Note (Addendum)
Checking MRI with and without to assess renal lesion and adrenal lesion. Treat as appropriate. Renal function assessing with CMP today.

## 2022-08-29 NOTE — Assessment & Plan Note (Signed)
CT with some question of cirrhosis morphology of the liver. We will follow up with this after current pain is resolved.

## 2022-08-29 NOTE — Assessment & Plan Note (Signed)
Checking CBC to assess today.

## 2022-08-29 NOTE — Assessment & Plan Note (Signed)
He is asking for phentermine prescription and reminded him this is a lifetime limit on how much someone can take and he is unable to take this ever again in his lifetime.

## 2022-08-29 NOTE — Assessment & Plan Note (Signed)
Checking GC/chlamydia and HIV and RPR. He may have severe muscle cramping from MRI scan positioning. Rx small supply of percocet for the severe limited pain and he understands we will not keep him on this long term.

## 2022-08-29 NOTE — Assessment & Plan Note (Signed)
Follow up due ordered HgA1c. Adjust as needed.

## 2022-08-29 NOTE — Assessment & Plan Note (Signed)
MRI with and without ordered to assess further per CT recommendations. No clinical signs of this being hormonally active.

## 2022-08-30 LAB — GC/CHLAMYDIA PROBE AMP
Chlamydia trachomatis, NAA: NEGATIVE
Neisseria Gonorrhoeae by PCR: NEGATIVE

## 2022-09-06 ENCOUNTER — Ambulatory Visit
Admission: RE | Admit: 2022-09-06 | Discharge: 2022-09-06 | Disposition: A | Discharge status: Home / Self Care | Payer: 59 | Source: Ambulatory Visit | Attending: Internal Medicine | Admitting: Internal Medicine

## 2022-09-06 DIAGNOSIS — N2889 Other specified disorders of kidney and ureter: Secondary | ICD-10-CM

## 2022-09-06 MED ORDER — GADOPICLENOL 0.5 MMOL/ML IV SOLN
10.0000 mL | Freq: Once | INTRAVENOUS | Status: AC | PRN
  Administered 2022-09-06: 10 mL via INTRAVENOUS

## 2022-09-13 ENCOUNTER — Telehealth: Payer: Self-pay | Admitting: Internal Medicine

## 2022-09-13 NOTE — Telephone Encounter (Signed)
Patient called and said he saw Dr. Okey Dupre about back pain. He had imaging done and was told to call the office about the results. Patient said his back is still in a lot of pain. He would like a call back at [phone].

## 2022-09-16 ENCOUNTER — Other Ambulatory Visit: Payer: Self-pay | Admitting: Internal Medicine

## 2022-09-16 DIAGNOSIS — N289 Disorder of kidney and ureter, unspecified: Secondary | ICD-10-CM

## 2022-09-16 NOTE — Telephone Encounter (Signed)
The imaging was not related to chronic back pain. Results are in and he does need a pelvis MRI as they were not able to see 1 kidney on this imaging.

## 2022-09-16 NOTE — Telephone Encounter (Signed)
I called patient and explained to him that I have not received the results for his back just yet but once we do I assure him that we give him a call asap. Pt also stated that he would to get a ultrasound done for his kidneys? Because they are causing him pain!

## 2022-09-17 NOTE — Telephone Encounter (Signed)
Called patient and relayed back message from MD and he stated when he can be scheduled for the pelvis MRI?

## 2022-09-18 ENCOUNTER — Other Ambulatory Visit: Payer: Self-pay | Admitting: Internal Medicine

## 2022-09-20 NOTE — Telephone Encounter (Signed)
I have ordered this can go to referrals

## 2022-09-25 ENCOUNTER — Ambulatory Visit
Admission: RE | Admit: 2022-09-25 | Discharge: 2022-09-25 | Disposition: A | Discharge status: Home / Self Care | Payer: 59 | Source: Ambulatory Visit | Attending: Internal Medicine | Admitting: Internal Medicine

## 2022-09-25 DIAGNOSIS — N289 Disorder of kidney and ureter, unspecified: Secondary | ICD-10-CM

## 2022-09-25 MED ORDER — GADOPICLENOL 0.5 MMOL/ML IV SOLN
10.0000 mL | Freq: Once | INTRAVENOUS | Status: AC | PRN
  Administered 2022-09-25: 10 mL via INTRAVENOUS

## 2022-09-30 ENCOUNTER — Encounter: Payer: Self-pay | Admitting: Family Medicine

## 2022-09-30 ENCOUNTER — Ambulatory Visit: Payer: 59 | Admitting: Internal Medicine

## 2022-09-30 ENCOUNTER — Other Ambulatory Visit: Payer: Self-pay | Admitting: Family Medicine

## 2022-09-30 ENCOUNTER — Ambulatory Visit (INDEPENDENT_AMBULATORY_CARE_PROVIDER_SITE_OTHER): Payer: 59 | Admitting: Family Medicine

## 2022-09-30 VITALS — BP 130/72 | HR 78 | Temp 98.2°F | Resp 20 | Ht 72.0 in | Wt 238.0 lb

## 2022-09-30 DIAGNOSIS — Z125 Encounter for screening for malignant neoplasm of prostate: Secondary | ICD-10-CM | POA: Diagnosis not present

## 2022-09-30 DIAGNOSIS — N200 Calculus of kidney: Secondary | ICD-10-CM | POA: Diagnosis not present

## 2022-09-30 DIAGNOSIS — F419 Anxiety disorder, unspecified: Secondary | ICD-10-CM

## 2022-09-30 DIAGNOSIS — F332 Major depressive disorder, recurrent severe without psychotic features: Secondary | ICD-10-CM

## 2022-09-30 DIAGNOSIS — M545 Low back pain, unspecified: Secondary | ICD-10-CM | POA: Diagnosis not present

## 2022-09-30 DIAGNOSIS — R350 Frequency of micturition: Secondary | ICD-10-CM

## 2022-09-30 DIAGNOSIS — N50812 Left testicular pain: Secondary | ICD-10-CM | POA: Diagnosis not present

## 2022-09-30 DIAGNOSIS — N50811 Right testicular pain: Secondary | ICD-10-CM | POA: Diagnosis not present

## 2022-09-30 LAB — POCT URINALYSIS DIPSTICK
Bilirubin, UA: NEGATIVE
Blood, UA: NEGATIVE
Glucose, UA: NEGATIVE
Ketones, UA: POSITIVE
Nitrite, UA: NEGATIVE
Protein, UA: POSITIVE — AB
Spec Grav, UA: 1.015 (ref 1.010–1.025)
Urobilinogen, UA: NEGATIVE E.U./dL — AB
pH, UA: 5 (ref 5.0–8.0)

## 2022-09-30 LAB — PSA: PSA: 0.88 ng/mL (ref 0.10–4.00)

## 2022-09-30 MED ORDER — DULOXETINE HCL 30 MG PO CPEP: 30.0000 mg | ORAL_CAPSULE | Freq: Every day | ORAL | 2 refills | Status: DC

## 2022-09-30 MED ORDER — TAMSULOSIN HCL 0.4 MG PO CAPS
0.4000 mg | ORAL_CAPSULE | Freq: Every day | ORAL | 0 refills | Status: DC
Start: 2022-09-30 — End: 2022-09-30

## 2022-09-30 NOTE — Progress Notes (Signed)
Assessment & Plan:  1-2. Acute left-sided low back pain without sciatica/Pain in both testicles Discussed this could related to the kidney stone seen on imaging.  MRI of the pelvis results pending.  3. Kidney stone - tamsulosin (FLOMAX) 0.4 MG CAPS capsule; Take 1 capsule (0.4 mg total) by mouth daily.  Dispense: 30 capsule; Refill: 0  4. Urinary frequency - POCT urinalysis dipstick - Urine Culture; Future - PSA  5. Screening for prostate cancer - PSA  6-7. Severe episode of recurrent major depressive disorder, without psychotic features (HCC)/Anxiety Uncontrolled.  Discussed the importance of taking medication for anxiety and depression every day so that it can build up and work.  Also discussed that if at any point he decides he no longer wants to take it he needs to be weaned off of it. - DULoxetine (CYMBALTA) 30 MG capsule; Take 1 capsule (30 mg total) by mouth daily.  Dispense: 30 capsule; Refill: 2   Follow up plan: Return in about 6 weeks (around 11/11/2022) for depression/anxiety with PCP.  Deliah Boston, MSN, APRN, FNP-C  Subjective:  HPI: Grant Miller is a 47 y.o. male presenting on 09/30/2022 for Urinary Tract Infection (Low back pain and groin pain -comes and goes/Frequent and urgency ) and Depression (Off of meds - would like to talk about med options )  Patient reports low back pain that radiates around his side to his abdomen, and down to his testicles.  It is accompanied by urinary frequency and urgency.  He was seen in the ER on 08/27/2022 at which time a CT renal stone study showed a 4 mm stone of the left kidney, 2.5 cm left kidney cyst, 1.2 cm left adrenal nodule, aortic atherosclerosis, and changes suggestive of cirrhosis.  Urinalysis was negative at that time.  He then saw his PCP on 08/28/2022 at which time a MRI of the abdomen was ordered to further assess the adrenal nodule and kidney cyst.  GC/chlamydia, HIV, and RPR were all negative.  MRI of the abdomen on  09/06/2022 showed the 1.2 cm left adrenal nodule representing a benign lipid poor adenoma.  The left kidney was not evaluated as it is located in the left pelvis.  A MRI of the pelvis was completed on 09/25/2022, but the results are still pending.  Depression/Anxiety: Patient reports he has never taken Cymbalta on a daily basis as he is afraid of getting "hooked" on medications.     09/30/2022   10:21 AM 08/28/2022    8:17 AM 08/01/2022   10:12 AM  Depression screen PHQ 2/9  Decreased Interest 3 0 0  Down, Depressed, Hopeless 3 0 0  PHQ - 2 Score 6 0 0  Altered sleeping 3    Tired, decreased energy 3    Change in appetite 1    Feeling bad or failure about yourself  2    Trouble concentrating 3    Moving slowly or fidgety/restless 2    Suicidal thoughts 0    PHQ-9 Score 20    Difficult doing work/chores Somewhat difficult        09/30/2022   10:28 AM 08/17/2020    4:04 PM  GAD 7 : Generalized Anxiety Score  Nervous, Anxious, on Edge 3 1  Control/stop worrying 3 1  Worry too much - different things 3 1  Trouble relaxing 3 1  Restless 0 1  Easily annoyed or irritable 3 1  Afraid - awful might happen 2 2  Total GAD 7 Score  17 8  Anxiety Difficulty Somewhat difficult       ROS: Negative unless specifically indicated above in HPI.   Relevant past medical history reviewed and updated as indicated.   Allergies and medications reviewed and updated.   Current Outpatient Medications:    anastrozole (ARIMIDEX) 1 MG tablet, Take 1 mg by mouth once a week. Saturdays, Disp: , Rfl:    meloxicam (MOBIC) 15 MG tablet, Take 1 tablet (15 mg total) by mouth daily., Disp: 90 tablet, Rfl: 1   methocarbamol (ROBAXIN) 500 MG tablet, Take 1 tablet (500 mg total) by mouth 3 (three) times daily as needed for muscle spasms., Disp: 90 tablet, Rfl: 3   Multiple Vitamins-Minerals (ZINC PO), Take 1 tablet by mouth daily., Disp: , Rfl:    Syringe/Needle, Disp, (SYRINGE 3CC/20GX1") 20G X 1" 3 ML MISC, Use  weekly with testosterone, Disp: 50 each, Rfl: 0   testosterone cypionate (DEPOTESTOSTERONE CYPIONATE) 200 MG/ML injection, ADMINISTER 0.5 ML(100 MG) IN THE MUSCLE EVERY 7 DAYS, Disp: 5 mL, Rfl: 0   Cholecalciferol (D3 PO), Take 1 capsule by mouth daily. (Patient not taking: Reported on 09/30/2022), Disp: , Rfl:    DULoxetine (CYMBALTA) 30 MG capsule, Take 1 capsule (30 mg total) by mouth daily. (Patient not taking: Reported on 09/30/2022), Disp: 30 capsule, Rfl: 3   sildenafil (VIAGRA) 100 MG tablet, Take 0.5-1 tablets (50-100 mg total) by mouth daily as needed for erectile dysfunction., Disp: 15 tablet, Rfl: 11  Allergies  Allergen Reactions   Iodine Hives and Shortness Of Breath   Other Hives and Shortness Of Breath    All seafood causes this reaction   Shellfish Allergy     Other reaction(s): SHORTNESS OF BREATH    Objective:   BP 130/72   Pulse 78   Temp 98.2 F (36.8 C)   Resp 20   Ht 6' (1.829 m)   Wt 238 lb (108 kg)   BMI 32.28 kg/m    Physical Exam Vitals reviewed.  Constitutional:      General: He is not in acute distress.    Appearance: Normal appearance. He is not ill-appearing, toxic-appearing or diaphoretic.  HENT:     Head: Normocephalic and atraumatic.  Eyes:     General: No scleral icterus.       Right eye: No discharge.        Left eye: No discharge.     Conjunctiva/sclera: Conjunctivae normal.  Cardiovascular:     Rate and Rhythm: Normal rate.  Pulmonary:     Effort: Pulmonary effort is normal. No respiratory distress.  Musculoskeletal:        General: Normal range of motion.     Cervical back: Normal range of motion.  Skin:    General: Skin is warm and dry.  Neurological:     Mental Status: He is alert and oriented to person, place, and time. Mental status is at baseline.  Psychiatric:        Mood and Affect: Affect normal. Mood is anxious.        Behavior: Behavior normal.        Thought Content: Thought content normal.        Judgment: Judgment  normal.

## 2022-10-04 ENCOUNTER — Other Ambulatory Visit: Payer: Self-pay | Admitting: Internal Medicine

## 2022-10-04 DIAGNOSIS — K4020 Bilateral inguinal hernia, without obstruction or gangrene, not specified as recurrent: Secondary | ICD-10-CM

## 2022-10-11 ENCOUNTER — Other Ambulatory Visit: Payer: 59

## 2022-10-18 ENCOUNTER — Telehealth: Payer: Self-pay | Admitting: Internal Medicine

## 2022-10-18 DIAGNOSIS — N5319 Other ejaculatory dysfunction: Secondary | ICD-10-CM

## 2022-10-18 NOTE — Telephone Encounter (Signed)
Patient called and said he has had ejaculations that had nothing come out three times this week. He scheduled an appointment for Dr. Frutoso Chase soonest availability on 10/30/22, but he is concerned and would like to know if he should be seen sooner. He said he wants to wait to see Dr. Okey Dupre unless she thinks he needs to be seen sooner. Best callback is [phone].

## 2022-10-21 NOTE — Telephone Encounter (Signed)
I would recommend he see a urologist about this if he is willing and does not need apt with me for this

## 2022-10-21 NOTE — Telephone Encounter (Signed)
Advise pt to wait until appt?

## 2022-10-22 NOTE — Addendum Note (Signed)
Addended by: Myrlene Broker on: 10/22/2022 09:17 AM   Modules accepted: Orders

## 2022-10-22 NOTE — Telephone Encounter (Signed)
Patient states that he will see a urologist and also he was told that they were going to be a referral  in to see a kidney specialist but has not heard anything

## 2022-10-22 NOTE — Telephone Encounter (Signed)
Referral to urologist done. I am unsure what he is referring to with kidney referral. After his MRI he was referred to a surgeon for his hernias. I do not think he needs to see a nephrologist.

## 2022-10-30 ENCOUNTER — Other Ambulatory Visit: Payer: Self-pay | Admitting: Internal Medicine

## 2022-10-30 ENCOUNTER — Ambulatory Visit: Payer: 59 | Admitting: Internal Medicine

## 2022-11-11 NOTE — Telephone Encounter (Signed)
Pt called stating is in pain and it coming from his testicles. Please advise. Pt would like a call back immediately.

## 2022-11-20 ENCOUNTER — Telehealth: Payer: Self-pay | Admitting: Internal Medicine

## 2022-11-20 NOTE — Telephone Encounter (Signed)
Pt called stating is in pain and it coming from his testicles. Please advise. Pt would like a call back immediately.     SECOND ENCOUNTER.

## 2022-11-21 NOTE — Telephone Encounter (Signed)
Pt has been scheduled with sarah gray

## 2022-11-22 ENCOUNTER — Other Ambulatory Visit: Payer: Self-pay | Admitting: Nurse Practitioner

## 2022-11-22 ENCOUNTER — Ambulatory Visit (INDEPENDENT_AMBULATORY_CARE_PROVIDER_SITE_OTHER): Payer: 59 | Admitting: Nurse Practitioner

## 2022-11-22 VITALS — BP 134/78 | HR 62 | Temp 97.9°F | Ht 72.0 in | Wt 230.2 lb

## 2022-11-22 DIAGNOSIS — N50811 Right testicular pain: Secondary | ICD-10-CM

## 2022-11-22 DIAGNOSIS — E291 Testicular hypofunction: Secondary | ICD-10-CM

## 2022-11-22 DIAGNOSIS — R972 Elevated prostate specific antigen [PSA]: Secondary | ICD-10-CM

## 2022-11-22 DIAGNOSIS — N50812 Left testicular pain: Secondary | ICD-10-CM | POA: Diagnosis not present

## 2022-11-22 LAB — CBC WITH DIFFERENTIAL/PLATELET
Basophils Absolute: 0 10*3/uL (ref 0.0–0.1)
Basophils Relative: 0.9 % (ref 0.0–3.0)
Eosinophils Absolute: 0.1 10*3/uL (ref 0.0–0.7)
Eosinophils Relative: 1.8 % (ref 0.0–5.0)
HCT: 47.3 % (ref 39.0–52.0)
Hemoglobin: 15.2 g/dL (ref 13.0–17.0)
Lymphocytes Relative: 54.7 % — ABNORMAL HIGH (ref 12.0–46.0)
Lymphs Abs: 1.9 10*3/uL (ref 0.7–4.0)
MCHC: 32.2 g/dL (ref 30.0–36.0)
MCV: 83.9 fl (ref 78.0–100.0)
Monocytes Absolute: 0.2 10*3/uL (ref 0.1–1.0)
Monocytes Relative: 6 % (ref 3.0–12.0)
Neutro Abs: 1.2 10*3/uL — ABNORMAL LOW (ref 1.4–7.7)
Neutrophils Relative %: 36.6 % — ABNORMAL LOW (ref 43.0–77.0)
Platelets: 226 10*3/uL (ref 150.0–400.0)
RBC: 5.63 Mil/uL (ref 4.22–5.81)
RDW: 14.1 % (ref 11.5–15.5)
WBC: 3.4 10*3/uL — ABNORMAL LOW (ref 4.0–10.5)

## 2022-11-22 LAB — COMPREHENSIVE METABOLIC PANEL
ALT: 37 U/L (ref 0–53)
AST: 42 U/L — ABNORMAL HIGH (ref 0–37)
Albumin: 4.7 g/dL (ref 3.5–5.2)
Alkaline Phosphatase: 41 U/L (ref 39–117)
BUN: 18 mg/dL (ref 6–23)
CO2: 25 mEq/L (ref 19–32)
Calcium: 10 mg/dL (ref 8.4–10.5)
Chloride: 106 mEq/L (ref 96–112)
Creatinine, Ser: 1.43 mg/dL (ref 0.40–1.50)
GFR: 57.79 mL/min — ABNORMAL LOW (ref >60.00)
Glucose, Bld: 91 mg/dL (ref 70–99)
Potassium: 4.4 mEq/L (ref 3.5–5.1)
Sodium: 140 mEq/L (ref 135–145)
Total Bilirubin: 0.8 mg/dL (ref 0.2–1.2)
Total Protein: 7.4 g/dL (ref 6.0–8.3)

## 2022-11-22 LAB — URINALYSIS, ROUTINE W REFLEX MICROSCOPIC
Bilirubin Urine: NEGATIVE
Hgb urine dipstick: NEGATIVE
Ketones, ur: NEGATIVE
Leukocytes,Ua: NEGATIVE
Nitrite: NEGATIVE
Specific Gravity, Urine: 1.025 (ref 1.000–1.030)
Total Protein, Urine: NEGATIVE
Urine Glucose: NEGATIVE
Urobilinogen, UA: 0.2 (ref 0.0–1.0)
pH: 6 (ref 5.0–8.0)

## 2022-11-22 LAB — PSA: PSA: 4.34 ng/mL — ABNORMAL HIGH (ref 0.10–4.00)

## 2022-11-22 MED ORDER — SULFAMETHOXAZOLE-TRIMETHOPRIM 800-160 MG PO TABS
1.0000 | ORAL_TABLET | Freq: Two times a day (BID) | ORAL | 0 refills | Status: DC
Start: 2022-11-22 — End: 2023-08-11

## 2022-11-22 NOTE — Progress Notes (Signed)
Estimated Creatinine Clearance: 78.9 mL/min (by C-G formula based on SCr of 1.43 mg/dL).

## 2022-11-22 NOTE — Progress Notes (Signed)
Established Patient Office Visit  Subjective   Patient ID: Grant Miller, male    DOB: 15-Dec-1975  Age: 47 y.o. MRN: [id number]  Chief Complaint  Patient presents with   Testicle Pain    Been going for years, progression with current pain is worse    Patient arrives today for the above. Reports that he has chronic testicular pain that has been ongoing since 2022. This has been getting progressively worse. Concerned about prostate or testicular cancer.  Denies any new sexual partners, denies penile discharge.  Reports that standing makes it worse, sometimes sitting in certain positions makes it worse.  Denies any unintentional weight loss.  Does report he has current bilateral inguinal hernias.  Describes the pain as a pulling sensation.   Per chart review it appears that he has been seen a few times over the last 3 months regarding this pain.  Additionally had PSA completed about 2 months ago which was 0.88.  He has undergone MRI of abdomen and pelvis which has identified cysts of the left kidney, and bilateral inguinal hernias, adrenal nodule which is thought to be not hormonally active.  He is also undergone CT scan of kidneys which has identified multiple kidney stones.     Review of Systems  Constitutional:  Negative for chills and fever.  Musculoskeletal:  Positive for back pain.      Objective:     BP 134/78   Pulse 62   Temp 97.9 F (36.6 C) (Temporal)   Ht 6' (1.829 m)   Wt 230 lb 4 oz (104.4 kg)   SpO2 95%   BMI 31.23 kg/m  BP Readings from Last 3 Encounters:  11/22/22 134/78  09/30/22 130/72  08/28/22 130/80   Wt Readings from Last 3 Encounters:  11/22/22 230 lb 4 oz (104.4 kg)  09/30/22 238 lb (108 kg)  08/28/22 235 lb (106.6 kg)      Physical Exam Vitals reviewed. Exam conducted with a chaperone present.  Constitutional:      Appearance: Normal appearance.  HENT:     Head: Normocephalic and atraumatic.  Cardiovascular:     Rate and Rhythm: Normal  rate and regular rhythm.  Pulmonary:     Effort: Pulmonary effort is normal.     Breath sounds: Normal breath sounds.  Abdominal:     General: A surgical scar is present.     Hernia: There is no hernia in the left inguinal area or right inguinal area.    Genitourinary:    Penis: Normal and circumcised.      Testes:        Right: Tenderness present. Mass, swelling, testicular hydrocele or varicocele not present.        Left: Tenderness present. Mass, swelling, testicular hydrocele or varicocele not present.     Epididymis:     Right: Normal.     Left: Normal.     Rectum: Normal.     Comments: Unable to palpate prostate as patient reported significant pain before digit was able to be inserted far enough to palpate prostate Musculoskeletal:     Cervical back: Neck supple.  Skin:    General: Skin is warm and dry.  Neurological:     Mental Status: He is alert and oriented to person, place, and time.  Psychiatric:        Mood and Affect: Mood normal.        Behavior: Behavior normal.        Thought Content: Thought content  normal.        Judgment: Judgment normal.      No results found for any visits on 11/22/22.    The 10-year ASCVD risk score (Arnett DK, et al., 2019) is: 5.7%    Assessment & Plan:   Problem List Items Addressed This Visit       Other   Pain in both testicles - Primary    Chronic, patient reports getting progressively worse. Etiology unclear as no masses, evidence of varicella hydrocele noted on exam today.  Unable to feel prostate due to patient reporting pain before prostate exam could be completed.  Patient does have known kidney stones, which may be source of pain as it radiates from back to groin.  He also has bilateral inguinal hernias.  Has previously been recommended to follow-up with surgeon if needed.  For now, will order testicular ultrasound for further evaluation as well as labs and referral to urology. Further recommendations may be made  based upon these results.      Relevant Orders   US SCROTUM W/DOPPLER   Chlamydia/Neisseria Gonorrhoeae RNA,TMA,Urogenital   HIV Antibody (routine testing w rflx)   RPR   Urine Culture   Urinalysis, Routine w reflex microscopic   Ambulatory referral to Urology   PSA   CBC with Differential/Platelet   Testosterone Total,Free,Bio, Males   Comprehensive metabolic panel   Other Visit Diagnoses     Hypogonadism in male       Relevant Orders   PSA   CBC with Differential/Platelet   Testosterone Total,Free,Bio, Males   Comprehensive metabolic panel       Return in about 1 month (around 12/22/2022) for F/U with Dr. Okey Dupre.  Total time spent on encounter today was 31 minutes including face-to-face evaluation, review of previous records, and development/discussion of treatment plan.   Elenore Paddy, NP

## 2022-11-22 NOTE — Addendum Note (Signed)
Addended by: Elenore Paddy on: 11/22/2022 03:43 PM   Modules accepted: Orders

## 2022-11-22 NOTE — Assessment & Plan Note (Signed)
Chronic, patient reports getting progressively worse. Etiology unclear as no masses, evidence of varicella hydrocele noted on exam today.  Unable to feel prostate due to patient reporting pain before prostate exam could be completed.  Patient does have known kidney stones, which may be source of pain as it radiates from back to groin.  He also has bilateral inguinal hernias.  Has previously been recommended to follow-up with surgeon if needed.  For now, will order testicular ultrasound for further evaluation as well as labs and referral to urology. Further recommendations may be made based upon these results.

## 2022-11-26 ENCOUNTER — Ambulatory Visit
Admission: RE | Admit: 2022-11-26 | Discharge: 2022-11-26 | Disposition: A | Discharge status: Home / Self Care | Payer: 59 | Source: Ambulatory Visit | Attending: Nurse Practitioner | Admitting: Nurse Practitioner

## 2022-11-26 DIAGNOSIS — N50811 Right testicular pain: Secondary | ICD-10-CM

## 2022-11-27 LAB — SPECIMEN STATUS REPORT

## 2022-11-28 ENCOUNTER — Telehealth: Payer: Self-pay | Admitting: Nurse Practitioner

## 2022-11-28 LAB — TESTOSTERONE TOTAL,FREE,BIO, MALES
Albumin: 4.9 g/dL (ref 3.6–5.1)
Sex Hormone Binding: 10 nmol/L (ref 10–50)
Testosterone: 49 ng/dL — ABNORMAL LOW (ref 250–827)

## 2022-11-28 LAB — URINE CULTURE

## 2022-11-28 LAB — HIV ANTIBODY (ROUTINE TESTING W REFLEX): HIV 1&2 Ab, 4th Generation: NONREACTIVE

## 2022-11-28 LAB — RPR: RPR Ser Ql: NONREACTIVE

## 2022-11-28 NOTE — Telephone Encounter (Signed)
Please call patient and let him know that if his hernia is hurting this can be a sign of emergent strangulation, which would require emergency surgery. Thus, if his hernia is hurting he should be seen for evaluation. The safest option is to go to the emergency department to determine if he needs immediate treatment or if he can be treated in the outpatient setting.    As far as I am aware there is not a specific diet for PSA. I would recommend he discuss this with the urologist. In the meantime following a overall healthy diet focused on fruits, vegetables, lean proteins, and whole grains would be recommended.

## 2022-11-29 ENCOUNTER — Other Ambulatory Visit: Payer: Self-pay | Admitting: Internal Medicine

## 2022-11-29 ENCOUNTER — Other Ambulatory Visit: Payer: Self-pay | Admitting: Nurse Practitioner

## 2022-11-29 DIAGNOSIS — R7989 Other specified abnormal findings of blood chemistry: Secondary | ICD-10-CM

## 2022-11-29 NOTE — Telephone Encounter (Signed)
Made pt aware of Maralyn Sago note, pt stated he will wait too more weeks to decide on it. And will call back if he needs to.

## 2022-12-01 LAB — GC/CHLAMYDIA PROBE AMP
Chlamydia trachomatis, NAA: POSITIVE — AB
Neisseria Gonorrhoeae by PCR: NEGATIVE

## 2022-12-01 LAB — SPECIMEN STATUS REPORT

## 2022-12-02 ENCOUNTER — Telehealth: Payer: Self-pay | Admitting: Internal Medicine

## 2022-12-02 NOTE — Telephone Encounter (Signed)
Pt called wanting to know can he get Rx prescribed  back for the Oxycodone for his back because he stated that's the only thing that works for him. Please advise  [id number] [place], [place] - [address] AT [facility] OF [address] & [facility] [address] [place], [place] [address] Phone: [phone]  Fax: [phone]

## 2022-12-02 NOTE — Telephone Encounter (Signed)
I would be happy to refer him to a back specialist to assess his back but cannot prescribe oxycodone for this.

## 2022-12-03 ENCOUNTER — Other Ambulatory Visit: Payer: Self-pay | Admitting: Nurse Practitioner

## 2022-12-03 ENCOUNTER — Telehealth: Payer: Self-pay | Admitting: Internal Medicine

## 2022-12-03 ENCOUNTER — Other Ambulatory Visit: Payer: 59

## 2022-12-03 DIAGNOSIS — R7989 Other specified abnormal findings of blood chemistry: Secondary | ICD-10-CM

## 2022-12-03 DIAGNOSIS — A749 Chlamydial infection, unspecified: Secondary | ICD-10-CM

## 2022-12-03 LAB — LUTEINIZING HORMONE: LH: 1.82 m[IU]/mL (ref 1.50–9.30)

## 2022-12-03 LAB — FOLLICLE STIMULATING HORMONE: FSH: 3.1 m[IU]/mL (ref 1.4–18.1)

## 2022-12-03 MED ORDER — DOXYCYCLINE HYCLATE 100 MG PO TABS
100.0000 mg | ORAL_TABLET | Freq: Two times a day (BID) | ORAL | 0 refills | Status: DC
Start: 2022-12-03 — End: 2023-09-30

## 2022-12-03 NOTE — Telephone Encounter (Signed)
Yes, take together

## 2022-12-03 NOTE — Telephone Encounter (Signed)
Pt called wanting to know do he still need to take theese 2 medications together  that was prescribed please call pt with update.   sulfamethoxazole-trimethoprim (BACTRIM DS) 800-160 MG tablet  doxycycline (VIBRA-TABS) 100 MG tablet

## 2022-12-04 LAB — TESTOSTERONE TOTAL,FREE,BIO, MALES
Albumin: 5 g/dL (ref 3.6–5.1)
Sex Hormone Binding: 13 nmol/L (ref 10–50)
Testosterone: 86 ng/dL — ABNORMAL LOW (ref 250–827)

## 2022-12-04 LAB — PROLACTIN: Prolactin: 5.2 ng/mL (ref 2.0–18.0)

## 2022-12-04 NOTE — Telephone Encounter (Signed)
He has seen Dr. Jean Rosenthal downstairs for this in the past and can just call for an appointment.

## 2022-12-04 NOTE — Telephone Encounter (Signed)
Pt verbalized he understood

## 2022-12-04 NOTE — Telephone Encounter (Signed)
Spoke with patient about this and he verbalized he understood

## 2022-12-04 NOTE — Telephone Encounter (Signed)
Pt agrees to seeing the specialist again

## 2022-12-10 NOTE — Telephone Encounter (Signed)
Patient called and states that he has taken all of his medication.  He wants to know when he needs to come back in to be tested again.  Please call patient at [phone]

## 2022-12-11 NOTE — Telephone Encounter (Signed)
Informed pt about this and he wanted to know what is the best thing to take for arthritis

## 2022-12-12 ENCOUNTER — Other Ambulatory Visit: Payer: Self-pay | Admitting: Internal Medicine

## 2022-12-12 NOTE — Telephone Encounter (Signed)
There is not a test needed for clearance of chlamydia. Tylenol is safest for arthritis pain.

## 2022-12-16 NOTE — Telephone Encounter (Signed)
Patient verbalized he understood.

## 2022-12-27 ENCOUNTER — Other Ambulatory Visit: Payer: Self-pay | Admitting: Internal Medicine

## 2022-12-27 DIAGNOSIS — N200 Calculus of kidney: Secondary | ICD-10-CM

## 2022-12-30 ENCOUNTER — Other Ambulatory Visit: Payer: Self-pay | Admitting: Internal Medicine

## 2022-12-31 ENCOUNTER — Other Ambulatory Visit: Payer: Self-pay | Admitting: Internal Medicine

## 2022-12-31 DIAGNOSIS — N200 Calculus of kidney: Secondary | ICD-10-CM

## 2023-01-01 IMAGING — DX DG CERVICAL SPINE COMPLETE 4+V
5 series · 5 of 5 positions shown · non-contrast
Comparison: February 09, 2009

CLINICAL DATA: Cervicalgia

EXAM:
CERVICAL SPINE - COMPLETE 4+ VIEW

[c-spine lat]
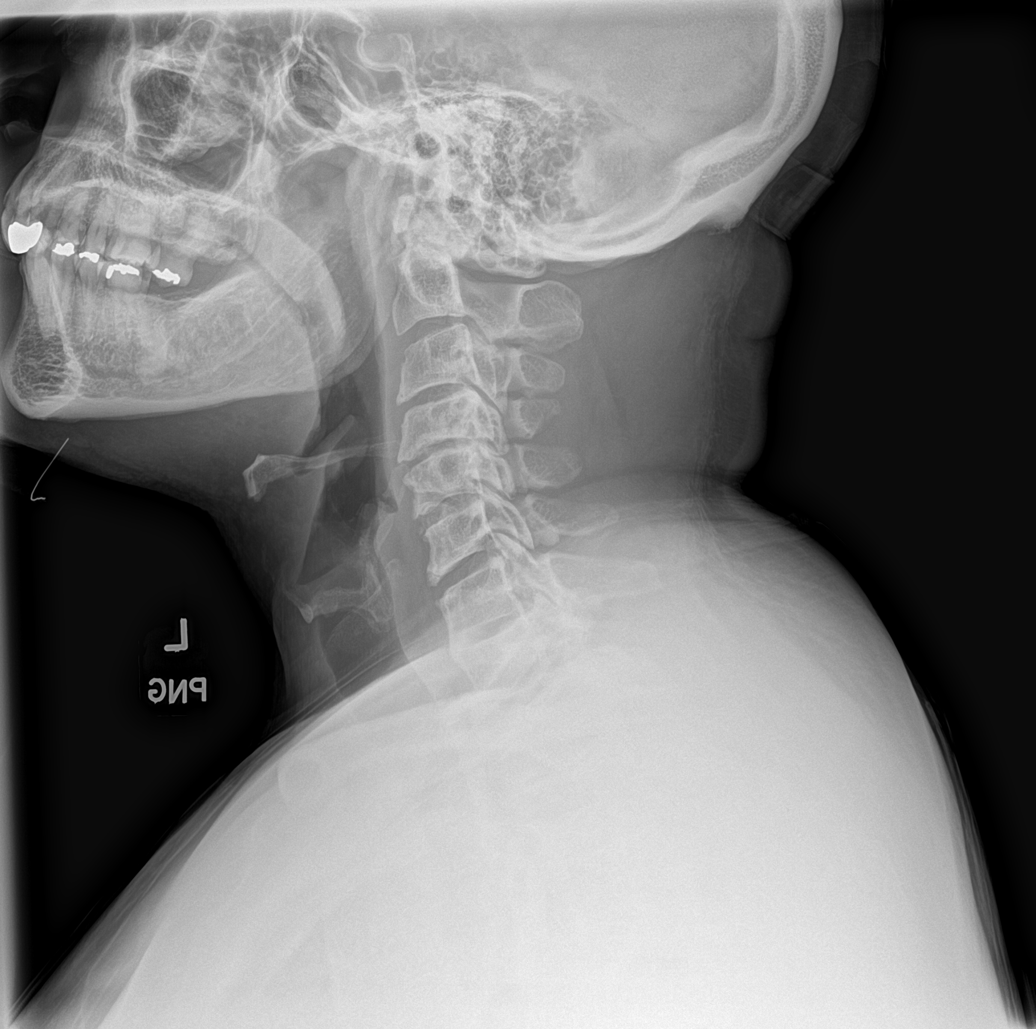

[c-spine obl (1 of 2)]
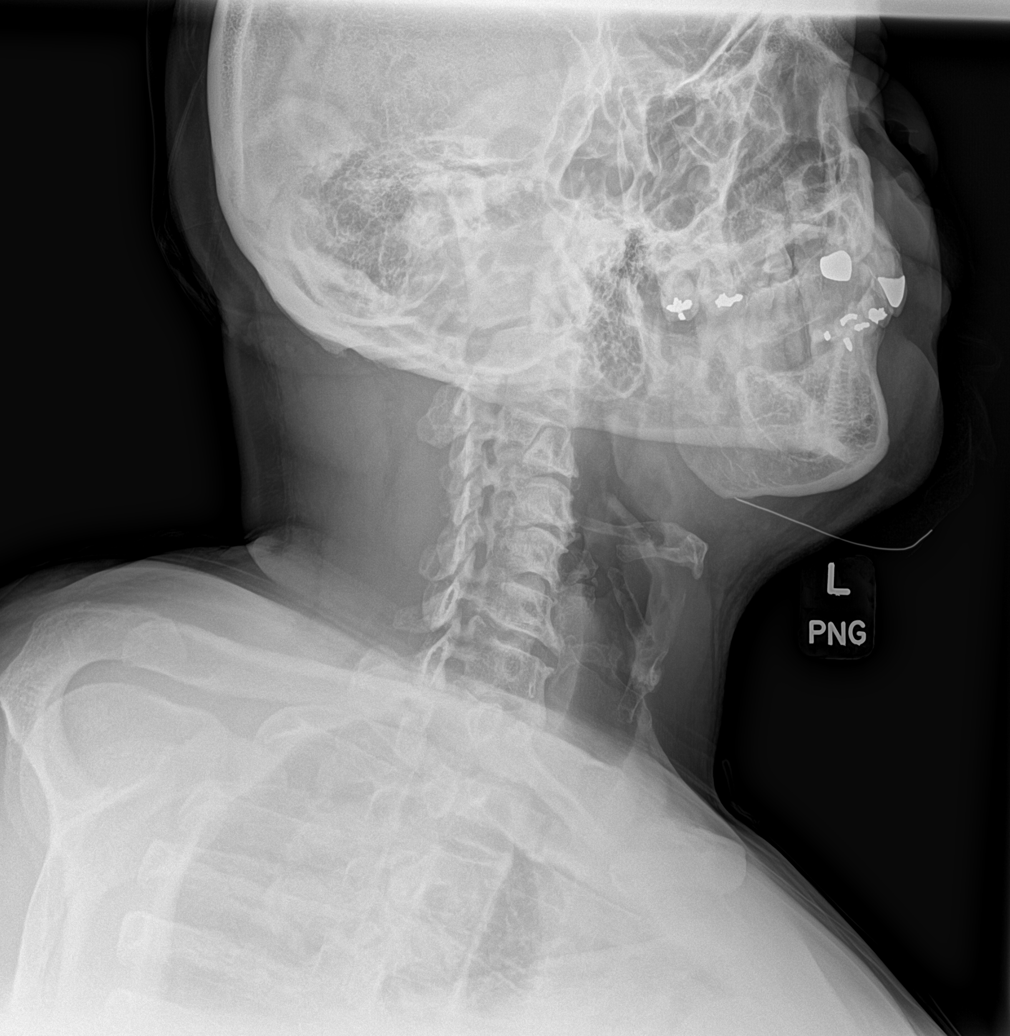

[c-spine obl (2 of 2)]
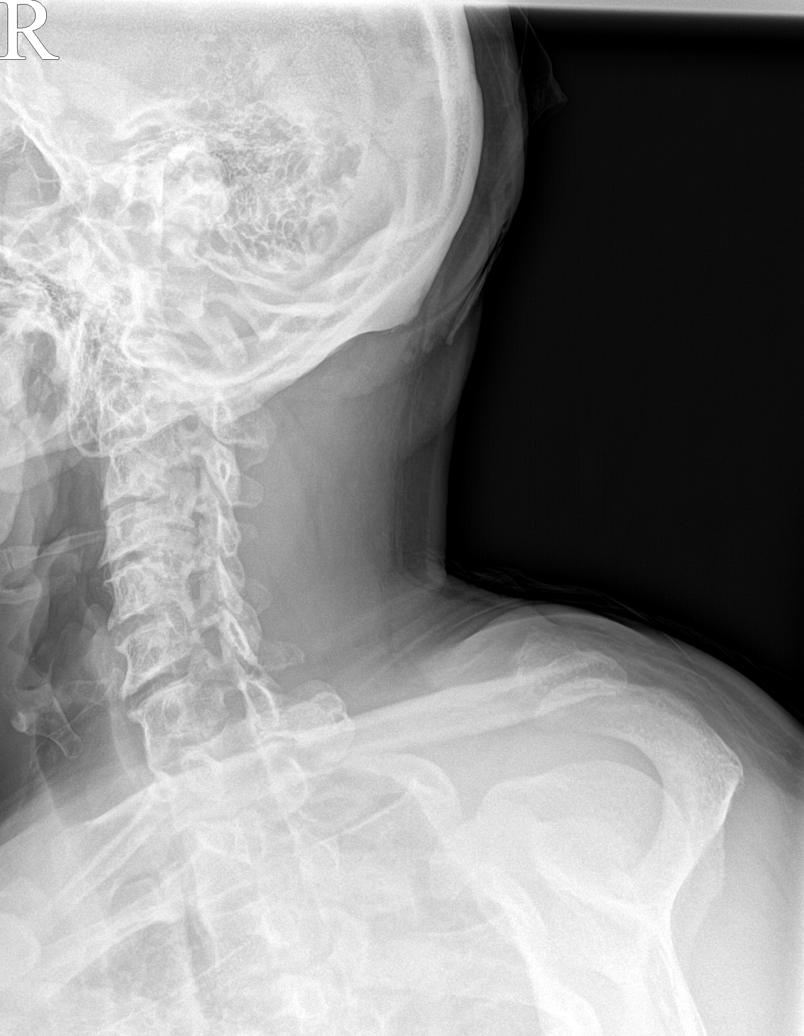

[c-spine ap]
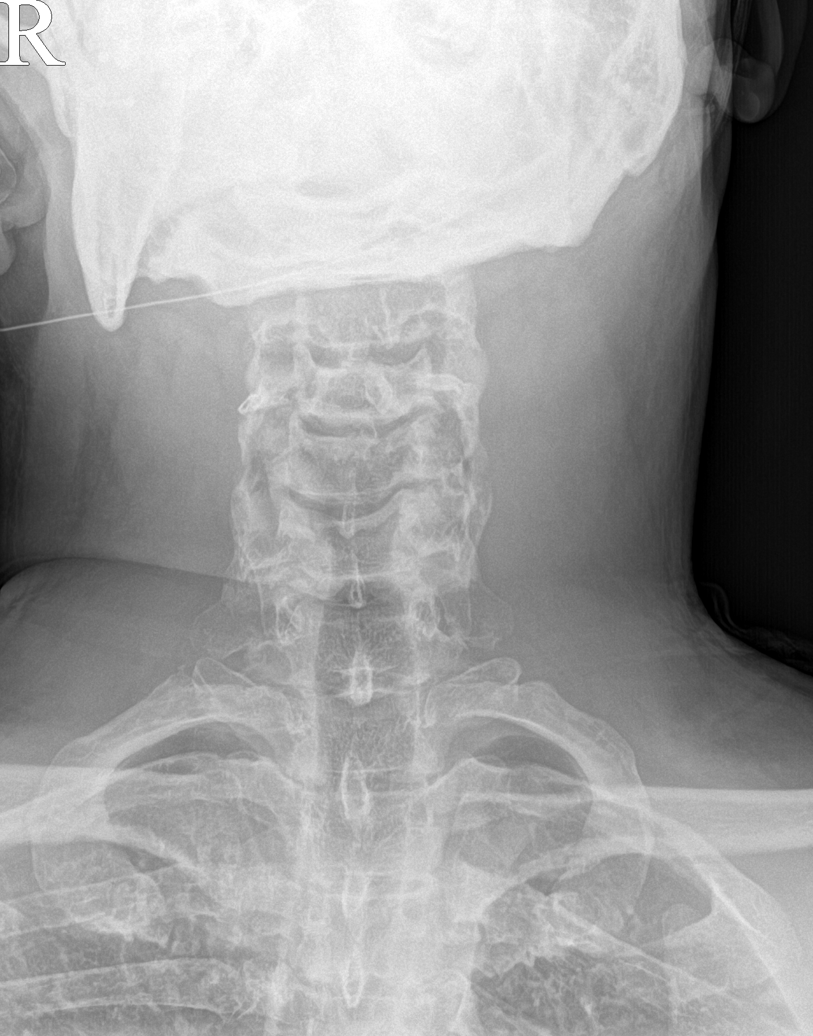

[c-spine open mouth]
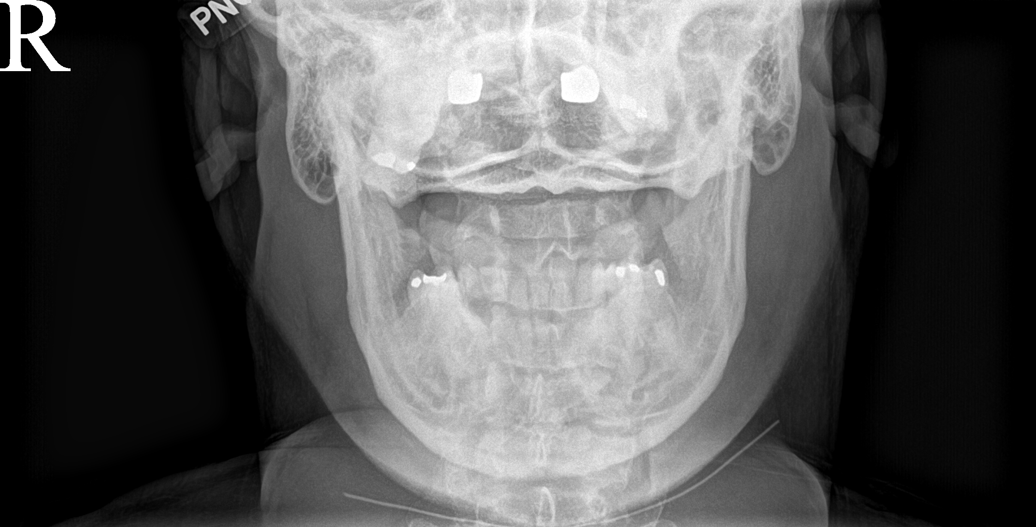

[5 of 5 positions shown; findings below may reference images not displayed]

FINDINGS: Frontal, lateral, open-mouth odontoid, and bilateral oblique views
were obtained. There is no fracture or spondylolisthesis.
Prevertebral soft tissues and predental space regions are normal.
There is moderate disc space narrowing at C4-5 with milder disc
space narrowing at C3-4. Anterior osteophytes noted at C3, C4, C5,
and C6. There is facet hypertrophy with exit foraminal narrowing at
C3-4, C4-5, C5-6, and C6-7 bilaterally. No erosive changes. Lung
apices are clear.
IMPRESSION: No multifocal osteoarthritic change, progressed overall compared to
the 4323 study. No fracture or spondylolisthesis.

## 2023-03-31 ENCOUNTER — Ambulatory Visit: Payer: 59 | Admitting: Internal Medicine

## 2023-04-30 ENCOUNTER — Telehealth: Payer: Self-pay | Admitting: Internal Medicine

## 2023-04-30 NOTE — Telephone Encounter (Signed)
 Copied from CRM [id number]. Topic: Referral - Request for Referral >> Apr 30, 2023  2:28 PM Fredrica W wrote: Did the patient discuss referral with their provider in the last year? Yes - back concerns (If No - schedule appointment) (If Yes - send message)  Appointment offered? No  Type of order/referral and detailed reason for visit: Needles for back   Preference of office, provider, location: [facility] [place] Fax: [phone] already confirmed they take his insurance but they need referral for appt.   If referral order, have you been seen by this specialty before? No (If Yes, this issue or another issue? When? Where?  Can we respond through MyChart? Yes

## 2023-05-01 NOTE — Telephone Encounter (Signed)
Agree needs apt.

## 2023-05-01 NOTE — Telephone Encounter (Signed)
 Schedule a appointment for the patient

## 2023-05-07 NOTE — Telephone Encounter (Signed)
 Patient has been scheduled

## 2023-05-22 ENCOUNTER — Ambulatory Visit (INDEPENDENT_AMBULATORY_CARE_PROVIDER_SITE_OTHER)

## 2023-05-22 ENCOUNTER — Ambulatory Visit: Admitting: Internal Medicine

## 2023-05-22 ENCOUNTER — Encounter: Payer: Self-pay | Admitting: Internal Medicine

## 2023-05-22 VITALS — BP 124/80 | HR 58 | Temp 98.2°F | Ht 72.0 in | Wt 229.0 lb

## 2023-05-22 DIAGNOSIS — N50812 Left testicular pain: Secondary | ICD-10-CM

## 2023-05-22 DIAGNOSIS — R7303 Prediabetes: Secondary | ICD-10-CM

## 2023-05-22 DIAGNOSIS — E291 Testicular hypofunction: Secondary | ICD-10-CM

## 2023-05-22 DIAGNOSIS — M47816 Spondylosis without myelopathy or radiculopathy, lumbar region: Secondary | ICD-10-CM

## 2023-05-22 DIAGNOSIS — N50811 Right testicular pain: Secondary | ICD-10-CM | POA: Diagnosis not present

## 2023-05-22 DIAGNOSIS — D709 Neutropenia, unspecified: Secondary | ICD-10-CM

## 2023-05-22 DIAGNOSIS — K7460 Unspecified cirrhosis of liver: Secondary | ICD-10-CM | POA: Diagnosis not present

## 2023-05-22 LAB — COMPREHENSIVE METABOLIC PANEL
ALT: 30 U/L (ref 0–53)
AST: 29 U/L (ref 0–37)
Albumin: 4.7 g/dL (ref 3.5–5.2)
Alkaline Phosphatase: 53 U/L (ref 39–117)
BUN: 19 mg/dL (ref 6–23)
CO2: 29 meq/L (ref 19–32)
Calcium: 9.8 mg/dL (ref 8.4–10.5)
Chloride: 106 meq/L (ref 96–112)
Creatinine, Ser: 1.19 mg/dL (ref 0.40–1.50)
GFR: 71.79 mL/min (ref >60.00)
Glucose, Bld: 101 mg/dL — ABNORMAL HIGH (ref 70–99)
Potassium: 4 meq/L (ref 3.5–5.1)
Sodium: 141 meq/L (ref 135–145)
Total Bilirubin: 0.4 mg/dL (ref 0.2–1.2)
Total Protein: 7.5 g/dL (ref 6.0–8.3)

## 2023-05-22 LAB — TESTOSTERONE TOTAL,FREE,BIO, MALES
Albumin: 4.5 g/dL (ref 3.6–5.1)
Sex Hormone Binding: 16 nmol/L (ref 10–50)
Testosterone: 246 ng/dL — ABNORMAL LOW (ref 250–827)

## 2023-05-22 LAB — HEMOGLOBIN A1C: Hgb A1c MFr Bld: 6.1 % (ref 4.6–6.5)

## 2023-05-22 LAB — CBC
HCT: 41.2 % (ref 39.0–52.0)
Hemoglobin: 13.4 g/dL (ref 13.0–17.0)
MCHC: 32.6 g/dL (ref 30.0–36.0)
MCV: 86.8 fl (ref 78.0–100.0)
Platelets: 249 10*3/uL (ref 150.0–400.0)
RBC: 4.74 Mil/uL (ref 4.22–5.81)
RDW: 13.1 % (ref 11.5–15.5)
WBC: 3.3 10*3/uL — ABNORMAL LOW (ref 4.0–10.5)

## 2023-05-22 LAB — LIPID PANEL
Cholesterol: 205 mg/dL — ABNORMAL HIGH (ref 0–200)
HDL: 43.2 mg/dL (ref >39.00)
LDL Cholesterol: 147 mg/dL — ABNORMAL HIGH (ref 0–99)
NonHDL: 161.31
Total CHOL/HDL Ratio: 5
Triglycerides: 71 mg/dL (ref 0.0–149.0)
VLDL: 14.2 mg/dL (ref 0.0–40.0)

## 2023-05-22 MED ORDER — MELOXICAM 15 MG PO TABS: 15.0000 mg | ORAL_TABLET | Freq: Every day | ORAL | 1 refills | Status: DC

## 2023-05-22 NOTE — Patient Instructions (Addendum)
 You have a lipoma on the stomach.   We will get you in with the urologist.  We will get you an x-ray today and will get you in with PT. Start taking meloxicam again daily for 2 weeks then as needed for pain.  Call [place] [facility] about the lipoma. Phone: [phone]

## 2023-05-22 NOTE — Progress Notes (Unsigned)
   Subjective:   Patient ID: Grant Miller, male    DOB: Jun 18, 1975, 48 y.o.   MRN: [id number]  HPI The patient is a 48 YO man coming in for multiple issues including groin pain (unsure if he saw urology, barely remembers which provider he has seen and what they said) and low testosterone (he stopped taking supplements and was having some moodiness with it) and back pain (wants to talk to back doctor, stopped meloxicam due to being scared of taking long term, that is when back got owrse).   Review of Systems  Constitutional:  Positive for activity change. Negative for appetite change, fatigue, fever and unexpected weight change.  Respiratory: Negative.    Cardiovascular: Negative.   Genitourinary:        Groin pain  Musculoskeletal:  Positive for back pain and myalgias. Negative for arthralgias.  Skin: Negative.   Neurological:  Negative for syncope, weakness and numbness.  Psychiatric/Behavioral:  Positive for dysphoric mood.     Objective:  Physical Exam Constitutional:      Appearance: He is well-developed.  HENT:     Head: Normocephalic and atraumatic.  Cardiovascular:     Rate and Rhythm: Normal rate and regular rhythm.  Pulmonary:     Effort: Pulmonary effort is normal. No respiratory distress.     Breath sounds: Normal breath sounds. No wheezing or rales.  Abdominal:     General: Bowel sounds are normal. There is no distension.     Palpations: Abdomen is soft.     Tenderness: There is abdominal tenderness. There is no rebound.     Comments: Left pelvis pain  Musculoskeletal:        General: Tenderness present.     Cervical back: Normal range of motion.  Skin:    General: Skin is warm and dry.  Neurological:     Mental Status: He is alert and oriented to person, place, and time.     Coordination: Coordination normal.     Vitals:   05/22/23 0905  BP: 124/80  Pulse: (!) 58  Temp: 98.2 F (36.8 C)  TempSrc: Oral  SpO2: 98%  Weight: 229 lb (103.9 kg)  Height:  6' (1.829 m)    Assessment & Plan:

## 2023-05-23 ENCOUNTER — Encounter: Payer: Self-pay | Admitting: Internal Medicine

## 2023-05-23 NOTE — Assessment & Plan Note (Signed)
 He is very unsure if he went to urology so new referral done today. We do not have any notes so I am unsure. He was advised by surgeon to get assessment of gu before they would consider bilateral hernia surgery.

## 2023-05-23 NOTE — Assessment & Plan Note (Signed)
 Due for follow up checking Hga1c.

## 2023-05-23 NOTE — Assessment & Plan Note (Signed)
Checking CBC for monitoring.

## 2023-05-23 NOTE — Assessment & Plan Note (Signed)
 Checking testosterone free and total.

## 2023-05-23 NOTE — Assessment & Plan Note (Signed)
 Checking CMP for stability and CBC.

## 2023-05-23 NOTE — Assessment & Plan Note (Signed)
 Referral to neurosurgery and checking x-ray as none in some time. Rx meloxicam 15 mg daily to resume as this was helping.

## 2023-06-16 ENCOUNTER — Other Ambulatory Visit: Payer: Self-pay | Admitting: Internal Medicine

## 2023-06-16 MED ORDER — METHOCARBAMOL 500 MG PO TABS: 500.0000 mg | ORAL_TABLET | Freq: Three times a day (TID) | ORAL | 3 refills | Status: AC | PRN

## 2023-06-16 NOTE — Telephone Encounter (Signed)
 Copied from CRM [id number]. Topic: Clinical - Medication Refill >> Jun 16, 2023  8:33 AM Freya Jesus wrote: Most Recent Primary Care Visit:  Provider: Bambi Lever A  Department: [facility]  Visit Type: OFFICE VISIT  Date: 05/22/2023  Medication: methocarbamol (ROBAXIN) 500 MG tablet [[id number]]  Has the patient contacted their pharmacy? No (Agent: If no, request that the patient contact the pharmacy for the refill. If patient does not wish to contact the pharmacy document the reason why and proceed with request.) (Agent: If yes, when and what did the pharmacy advise?) Patient stated the current prescription for his back pain is not helping his back pain.  Is this the correct pharmacy for this prescription? Yes If no, delete pharmacy and type the correct one.  This is the patient's preferred pharmacy:  [id number] Jonette Nestle, [place] - [address] AT [facility] [address] [facility] [address] [place] [address] Phone: [phone] Fax: [phone]   Has the prescription been filled recently? No  Is the patient out of the medication? Yes  Has the patient been seen for an appointment in the last year OR does the patient have an upcoming appointment? Yes  Can we respond through MyChart? No  Agent: Please be advised that Rx refills may take up to 3 business days. We ask that you follow-up with your pharmacy.

## 2023-07-03 ENCOUNTER — Telehealth: Payer: Self-pay | Admitting: Internal Medicine

## 2023-07-03 NOTE — Telephone Encounter (Unsigned)
 Copied from CRM [id number]. Topic: Referral - Request for Referral >> Jul 03, 2023 11:55 AM [place] J wrote: Did the patient discuss referral with their provider in the last year? Yes (If No - schedule appointment) (If Yes - send message)  Appointment offered? No  Type of order/referral and detailed reason for visit: Patient stated that the hernia's in his back are still bothering him. He received injections in his back some time ago but it's not helping with his pain.  Preference of office, provider, location: Unknown. Patient prefers a referral that would best help him with herniated discs.   If referral order, have you been seen by this specialty before? No (If Yes, this issue or another issue? When? Where?  Can we respond through MyChart? No

## 2023-07-04 NOTE — Telephone Encounter (Signed)
 He has a back specialist already he should call them for an apt

## 2023-08-11 ENCOUNTER — Ambulatory Visit (INDEPENDENT_AMBULATORY_CARE_PROVIDER_SITE_OTHER): Payer: 59

## 2023-08-11 VITALS — Ht 72.0 in | Wt 229.0 lb

## 2023-08-11 DIAGNOSIS — M519 Unspecified thoracic, thoracolumbar and lumbosacral intervertebral disc disorder: Secondary | ICD-10-CM

## 2023-08-11 DIAGNOSIS — Z5982 Transportation insecurity: Secondary | ICD-10-CM | POA: Diagnosis not present

## 2023-08-11 DIAGNOSIS — Z0000 Encounter for general adult medical examination without abnormal findings: Secondary | ICD-10-CM | POA: Diagnosis not present

## 2023-08-11 DIAGNOSIS — Z0100 Encounter for examination of eyes and vision without abnormal findings: Secondary | ICD-10-CM

## 2023-08-11 DIAGNOSIS — Z5986 Financial insecurity: Secondary | ICD-10-CM

## 2023-08-11 DIAGNOSIS — Z1159 Encounter for screening for other viral diseases: Secondary | ICD-10-CM

## 2023-08-11 NOTE — Progress Notes (Signed)
 Subjective:   [name] is a 48 y.o. who presents for a Medicare Wellness preventive visit.  As a reminder, Annual Wellness Visits don't include a physical exam, and some assessments may be limited, especially if this visit is performed virtually. We may recommend an in-person follow-up visit with your provider if needed.  Visit Complete: Virtual I connected with  [name] on 08/11/23 by a audio enabled telemedicine application and verified that I am speaking with the correct person using two identifiers.  Patient Location: Home  Provider Location: Office/Clinic  I discussed the limitations of evaluation and management by telemedicine. The patient expressed understanding and agreed to proceed.  Vital Signs: Because this visit was a virtual/telehealth visit, some criteria may be missing or patient reported. Any vitals not documented were not able to be obtained and vitals that have been documented are patient reported.  VideoDeclined- This patient declined Librarian, academic. Therefore the visit was completed with audio only.  Persons Participating in Visit: Patient.  AWV Questionnaire: No: Patient Medicare AWV questionnaire was not completed prior to this visit.  Cardiac Risk Factors include: advanced age (>50men, >32 women);male gender     Objective:     Today's Vitals   08/11/23 0858 08/11/23 0859  Weight: 229 lb (103.9 kg)   Height: 6' (1.829 m)   PainSc:  8   PainLoc: Back    Body mass index is 31.06 kg/m.     08/11/2023    8:53 AM 08/27/2022    4:03 AM 08/01/2022   10:16 AM 07/10/2021    4:10 PM 11/28/2020   12:25 PM 08/30/2020   11:36 AM 02/11/2018    8:49 AM  Advanced Directives  Does Patient Have a Medical Advance Directive? Yes No Yes Yes No No No  Type of Estate agent of [place];Living will  Healthcare Power of [place];Living will Living will;Healthcare Power of Attorney     Does patient want to make  changes to medical advance directive? No - Patient declined  No - Patient declined      Copy of Healthcare Power of Attorney in Chart? Yes - validated most recent copy scanned in chart (See row information)  Yes - validated most recent copy scanned in chart (See row information) No - copy requested     Would patient like information on creating a medical advance directive?      No - Patient declined No - Patient declined    Current Medications (verified) Outpatient Encounter Medications as of 08/11/2023  Medication Sig   Cholecalciferol (D3 PO) Take 1 capsule by mouth daily.   doxycycline  (VIBRA -TABS) 100 MG tablet Take 1 tablet (100 mg total) by mouth 2 (two) times daily.   DULoxetine  (CYMBALTA ) 30 MG capsule Take 1 capsule (30 mg total) by mouth daily.   meloxicam  (MOBIC ) 15 MG tablet Take 1 tablet (15 mg total) by mouth daily.   methocarbamol  (ROBAXIN ) 500 MG tablet Take 1 tablet (500 mg total) by mouth 3 (three) times daily as needed for muscle spasms.   Multiple Vitamins-Minerals (ZINC  PO) Take 1 tablet by mouth daily.   sildenafil  (VIAGRA ) 100 MG tablet Take 0.5-1 tablets (50-100 mg total) by mouth daily as needed for erectile dysfunction.   Syringe/Needle, Disp, (SYRINGE 3CC/20GX1") 20G X 1" 3 ML MISC Use weekly with testosterone    tiZANidine (ZANAFLEX) 4 MG capsule Take 4 mg by mouth 3 (three) times daily. Dr Donna Fus   [DISCONTINUED] sulfamethoxazole -trimethoprim  (BACTRIM  DS) 800-160 MG tablet Take  1 tablet by mouth 2 (two) times daily.   anastrozole (ARIMIDEX) 1 MG tablet Take 1 mg by mouth once a week. Saturdays (Patient not taking: Reported on 08/11/2023)   tamsulosin  (FLOMAX ) 0.4 MG CAPS capsule TAKE 1 CAPSULE(0.4 MG) BY MOUTH DAILY (Patient not taking: Reported on 08/11/2023)   testosterone  cypionate (DEPOTESTOSTERONE CYPIONATE) 200 MG/ML injection ADMINISTER 0.5 ML(100 MG) IN THE MUSCLE EVERY 7 DAYS (Patient not taking: Reported on 08/11/2023)   No facility-administered encounter medications  on file as of 08/11/2023.    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    Family History  Problem Relation Age of Onset   Heart disease Mother    Hyperlipidemia Mother    Kidney disease Mother    Diabetes Mother    Colon cancer Neg Hx    Esophageal cancer Neg Hx    Rectal cancer Neg Hx    Stomach cancer Neg Hx    Social History   Socioeconomic History   Marital status: Single    Spouse name: Not on file   Number of children: Not on file   Years of education: Not on file   Highest education level: Not on file  Occupational History   Not on file  Tobacco Use   Smoking status: Former    Current packs/day: 0.00    Types: Cigarettes    Quit date: 2003    Years since quitting: 22.4   Smokeless tobacco: Never  Vaping Use   Vaping status: Never Used  Substance and Sexual Activity   Alcohol use: No   Drug use: No   Sexual activity: Yes  Other Topics Concern   Not on file  Social History Narrative   Not on file   Social Drivers of Health   Financial Resource Strain: High Risk (08/11/2023)   Overall Financial Resource Strain (CARDIA)    Difficulty of Paying Living Expenses: Hard  Food Insecurity: No Food Insecurity (08/11/2023)   Hunger Vital Sign    Worried About Running Out of Food in the Last Year: Never true    Ran Out of Food in the Last Year: Never true  Transportation Needs: No Transportation Needs (08/11/2023)   PRAPARE - Administrator, Civil Service (Medical): No    Lack of Transportation (Non-Medical): No  Physical Activity: Insufficiently Active (08/11/2023)   Exercise Vital Sign    Days of Exercise per Week: 3 days    Minutes of Exercise per Session: 30 min  Stress: Stress Concern Present (08/11/2023)    [employer] of Occupational Health - Occupational Stress Questionnaire    Feeling of Stress : Very much  Social Connections: Moderately Integrated (08/11/2023)   Social Connection and Isolation Panel [NHANES]    Frequency of Communication with Friends and Family: More than three times a week    Frequency of Social Gatherings with Friends and Family: More than three times a week    Attends Religious Services: More than 4 times per year    Active Member of [employer] or Organizations: Yes    Attends Banker Meetings: More than 4 times per year    Marital Status: Never married    Tobacco  Counseling Counseling given: No    Clinical Intake:  Pre-visit preparation completed: Yes  Pain : 0-10 Pain Score: 8  Pain Type: Chronic pain Pain Location: Back Pain Orientation: Lower Pain Descriptors / Indicators: Constant Pain Onset: More than a month ago Pain Frequency: Constant Pain Relieving Factors: medications Effect of Pain on Daily Activities: phy therapy exercises  Pain Relieving Factors: medications  BMI - recorded: 31.06 Nutritional Status: BMI > 30  Obese Nutritional Risks: None Diabetes: No  Lab Results  Component Value Date   HGBA1C 6.1 05/22/2023   HGBA1C 6.0 08/28/2022   HGBA1C 6.3 11/08/2021     How often do you need to have someone help you when you read instructions, pamphlets, or other written materials from your doctor or pharmacy?: 1 - Never  Interpreter Needed?: No  Information entered by :: Kandy Orris, CMA   Activities of Daily Living     08/11/2023    9:10 AM  In your present state of health, do you have any difficulty performing the following activities:  Hearing? 0  Vision? 0  Difficulty concentrating or making decisions? 0  Walking or climbing stairs? 0  Dressing or bathing? 0  Doing errands, shopping? 0  Preparing Food and eating ? N  Using the Toilet? N  In the past six months, have you accidently leaked urine? N  Do you  have problems with loss of bowel control? N  Managing your Medications? N  Managing your Finances? N  Housekeeping or managing your Housekeeping? N    Patient Care Team: Adelia Homestead, MD as PCP - General (Internal Medicine) Arturo Bing, MD as Referring Physician (Neurology) Mariana Shipper, MD as Consulting Physician (Orthopedic Surgery) Cindee Crazier, MD as Attending Physician (Physical Medicine and Rehabilitation)  I have updated your Care Teams any recent Medical Services you may have received from other providers in the past year.     Assessment:    This is a routine wellness examination for Grant Miller.  Hearing/Vision screen Hearing Screening - Comments:: Denies hearing difficulties   Vision Screening - Comments:: Wears rx glasses - up to date with routine eye exams with Dr Gwendalyn Lemma   Goals Addressed               This Visit's Progress     Patient Stated (pt-stated)        Patient stated he wants to be able to walk better and hope back pain gets better.       Depression Screen     08/11/2023    9:11 AM 05/22/2023    9:06 AM 05/22/2023    9:05 AM 11/22/2022   11:01 AM 09/30/2022   10:21 AM 08/28/2022    8:17 AM 08/01/2022   10:12 AM  PHQ 2/9 Scores  PHQ - 2 Score 3 0 0 6 6 0 0  PHQ- 9 Score 6 0  20 20      Fall Risk     08/11/2023    9:22 AM 05/22/2023    9:13 AM 05/22/2023    9:05 AM 11/22/2022   11:01 AM 08/28/2022    8:17 AM  Fall Risk   Falls in the past year? 0 0 0 0 0  Number falls in past yr: 0 0 0 0 0  Injury with Fall? 0 0 0 0 0  Risk for fall due to : No Fall Risks   No Fall Risks No Fall Risks  Follow up Falls evaluation completed;Falls prevention discussed  Falls evaluation completed Falls evaluation completed Falls evaluation completed Falls evaluation completed    MEDICARE RISK AT HOME:  Medicare Risk at Home Any stairs in or around the home?: No If so, are there any without handrails?: No Home free of loose throw rugs in walkways,  pet beds, electrical cords, etc?: Yes Adequate lighting in your home to reduce risk of falls?: Yes Life alert?: No Use of a cane, walker or w/c?: No Grab bars in the bathroom?: Yes Shower chair or bench in shower?: Yes Elevated toilet seat or a handicapped toilet?: Yes  TIMED UP AND GO:  Was the test performed?  No  Cognitive Function: 6CIT completed        08/11/2023    9:23 AM 08/01/2022   10:16 AM 07/10/2021    4:14 PM  6CIT Screen  What Year? 0 points 0 points 0 points  What month? 0 points 0 points 0 points  What time? 0 points 0 points 0 points  Count back from 20 0 points 0 points 0 points  Months in reverse 0 points 0 points 0 points  Repeat phrase 0 points 2 points 0 points  Total Score 0 points 2 points 0 points    Immunizations Immunization History  Administered Date(s) Administered   PFIZER(Purple Top)SARS-COV-2 Vaccination 06/28/2019, 07/19/2019    Screening Tests Health Maintenance  Topic Date Due   Hepatitis C Screening  Never done   DTaP/Tdap/Td (1 - Tdap) Never done   Pneumococcal Vaccine 42-39 Years old (1 of 2 - PCV) Never done   COVID-19 Vaccine (3 - 2024-25 season) 11/03/2022   INFLUENZA VACCINE  10/03/2023   Medicare Annual Wellness (AWV)  08/10/2024   Colonoscopy  01/17/2026   HIV Screening  Completed   HPV VACCINES  Aged Out   Meningococcal B Vaccine  Aged Out    Health Maintenance  Health Maintenance Due  Topic Date Due   Hepatitis C Screening  Never done   DTaP/Tdap/Td (1 - Tdap) Never done   Pneumococcal Vaccine 81-62 Years old (1 of 2 - PCV) Never done   COVID-19 Vaccine (3 - 2024-25 season) 11/03/2022   Health Maintenance Items Addressed:  Referral sent to Optometry/Ophthalmology, - Dr Ben Bracken; Ordered a Hepatitis C Screening   Additional Screening:  Vision Screening: Recommended annual ophthalmology exams for early detection of glaucoma and other disorders of the eye. Would you like a referral to an eye doctor? Yes     Dental Screening: Recommended annual dental exams for proper oral hygiene  Community Resource Referral / Chronic Care Management: CRR required this visit?  Yes - VBCI Care Mgmt - Financial, Transportation, and Food  CCM required this visit?  No   Plan:    I have personally reviewed and noted the following in the patient's chart:   Medical and social history Use of alcohol, tobacco or illicit drugs  Current medications and supplements including opioid prescriptions. Patient is not currently taking opioid prescriptions. Functional ability and status Nutritional status Physical activity Advanced directives List of other physicians Hospitalizations, surgeries, and ER visits in previous 12 months Vitals Screenings to include cognitive, depression, and falls Referrals and appointments  In addition, I have reviewed and discussed with patient certain preventive protocols, quality metrics, and best practice recommendations. A written personalized care plan for preventive services as well as general preventive health recommendations were provided to patient.   Patria Bookbinder, CMA   08/11/2023   After Visit Summary: (Mail) Due to this being  a telephonic visit, the after visit summary with patients personalized plan was offered to patient via mail   Notes: Nothing significant to report at this time.

## 2023-08-11 NOTE — Patient Instructions (Signed)
 Grant Miller , Thank you for taking time out of your busy schedule to complete your Annual Wellness Visit with me. I enjoyed our conversation and look forward to speaking with you again next year. I, as well as your care team,  appreciate your ongoing commitment to your health goals. Please review the following plan we discussed and let me know if I can assist you in the future. Your Game plan/ To Do List    Referrals: If you haven't heard from the office you've been referred to, please reach out to them at the phone provided.  Referral to Grant Ben Bracken (Ophthalmologist); Referral to [facility] Social Worker/RN Case Management to assist with resources for Food, Transportation, and Financial security Follow up Visits: Next Medicare AWV with our clinical staff: 08/11/2024 at 8:50am Have you seen your provider in the last 6 months (3 months if uncontrolled diabetes)? Yes Next Office Visit with your provider: 09/30/2023 at 10:00am  Clinician Recommendations:  Aim for 30 minutes of exercise or brisk walking, 6-8 glasses of water, and 5 servings of fruits and vegetables each day. Educated and advised on getting the Tdap (Tetenus) and Pneumonia vaccines at local pharmacy in 2025 - bring report to Grant Miller at next appointment.      This is a list of the screening recommended for you and due dates:  Health Maintenance  Topic Date Due   Hepatitis C Screening  Never done   DTaP/Tdap/Td vaccine (1 - Tdap) Never done   Pneumococcal Vaccination (1 of 2 - PCV) Never done   COVID-19 Vaccine (3 - 2024-25 season) 11/03/2022   Flu Shot  10/03/2023   Medicare Annual Wellness Visit  08/10/2024   Colon Cancer Screening  01/17/2026   HIV Screening  Completed   HPV Vaccine  Aged Out   Meningitis B Vaccine  Aged Out    Advanced directives: (Declined) Advance directive discussed with you today. Even though you declined this today, please call our office should you change your mind, and we can give you the proper  paperwork for you to fill out. Advance Care Planning is important because it:  [x]  Makes sure you receive the medical care that is consistent with your values, goals, and preferences  [x]  It provides guidance to your family and loved ones and reduces their decisional burden about whether or not they are making the right decisions based on your wishes.  Follow the link provided in your after visit summary or read over the paperwork we have mailed to you to help you started getting your Advance Directives in place. If you need assistance in completing these, please reach out to us  so that we can help you!

## 2023-08-19 ENCOUNTER — Telehealth: Payer: Self-pay | Admitting: *Deleted

## 2023-08-19 NOTE — Progress Notes (Unsigned)
 Complex Care Management Note Care Guide Note  08/19/2023 Name: Grant Miller MRN: [id number] DOB: [birth year]   Complex Care Management Outreach Attempts: An unsuccessful telephone outreach was attempted today to offer the patient information about available complex care management services.  Follow Up Plan:  Additional outreach attempts will be made to offer the patient complex care management information and services.   Encounter Outcome:  No Answer  Kandis Ormond, CMA [place]  [facility], [facility] Guide Direct Dial: [phone]  Fax: [phone] Website: [email or web address]

## 2023-08-20 NOTE — Progress Notes (Unsigned)
 Complex Care Management Note Care Guide Note  08/20/2023 Name: Grant Miller MRN: [id number] DOB: 1975-05-28   Complex Care Management Outreach Attempts: A second unsuccessful outreach was attempted today to offer the patient with information about available complex care management services.  Follow Up Plan:  Additional outreach attempts will be made to offer the patient complex care management information and services.   Encounter Outcome:  No Answer  Kandis Ormond, CMA [place]  [facility], [facility] Guide Direct Dial: [phone]  Fax: [phone] Website: [email or web address]

## 2023-08-21 NOTE — Progress Notes (Signed)
 Complex Care Management Note Care Guide Note  08/21/2023 Name: Grant Miller MRN: [id number] DOB: 09/28/1975   Complex Care Management Outreach Attempts: A third unsuccessful outreach was attempted today to offer the patient with information about available complex care management services.  Follow Up Plan:  No further outreach attempts will be made at this time. We have been unable to contact the patient to offer or enroll patient in complex care management services.  Encounter Outcome:  No Answer  Kandis Ormond, CMA [place]  [facility], [facility] Guide Direct Dial: [phone]  Fax: [phone] Website: [email or web address]

## 2023-09-30 ENCOUNTER — Encounter: Payer: Self-pay | Admitting: Internal Medicine

## 2023-09-30 ENCOUNTER — Ambulatory Visit (INDEPENDENT_AMBULATORY_CARE_PROVIDER_SITE_OTHER): Admitting: Internal Medicine

## 2023-09-30 VITALS — BP 114/80 | HR 68 | Temp 98.2°F | Ht 72.0 in | Wt 229.0 lb

## 2023-09-30 DIAGNOSIS — E291 Testicular hypofunction: Secondary | ICD-10-CM

## 2023-09-30 DIAGNOSIS — R7303 Prediabetes: Secondary | ICD-10-CM

## 2023-09-30 DIAGNOSIS — D709 Neutropenia, unspecified: Secondary | ICD-10-CM | POA: Diagnosis not present

## 2023-09-30 DIAGNOSIS — Z0000 Encounter for general adult medical examination without abnormal findings: Secondary | ICD-10-CM | POA: Diagnosis not present

## 2023-09-30 DIAGNOSIS — E279 Disorder of adrenal gland, unspecified: Secondary | ICD-10-CM

## 2023-09-30 DIAGNOSIS — M545 Low back pain, unspecified: Secondary | ICD-10-CM

## 2023-09-30 DIAGNOSIS — G8929 Other chronic pain: Secondary | ICD-10-CM

## 2023-09-30 DIAGNOSIS — K4020 Bilateral inguinal hernia, without obstruction or gangrene, not specified as recurrent: Secondary | ICD-10-CM | POA: Diagnosis not present

## 2023-09-30 DIAGNOSIS — Z113 Encounter for screening for infections with a predominantly sexual mode of transmission: Secondary | ICD-10-CM

## 2023-09-30 DIAGNOSIS — K7460 Unspecified cirrhosis of liver: Secondary | ICD-10-CM

## 2023-09-30 DIAGNOSIS — Z1159 Encounter for screening for other viral diseases: Secondary | ICD-10-CM

## 2023-09-30 LAB — CBC WITH DIFFERENTIAL/PLATELET
Basophils Absolute: 0 K/uL (ref 0.0–0.1)
Basophils Relative: 0.8 % (ref 0.0–3.0)
Eosinophils Absolute: 0 K/uL (ref 0.0–0.7)
Eosinophils Relative: 1.7 % (ref 0.0–5.0)
HCT: 41.9 % (ref 39.0–52.0)
Hemoglobin: 13.5 g/dL (ref 13.0–17.0)
Lymphocytes Relative: 56.9 % — ABNORMAL HIGH (ref 12.0–46.0)
Lymphs Abs: 1.7 K/uL (ref 0.7–4.0)
MCHC: 32.2 g/dL (ref 30.0–36.0)
MCV: 84.7 fl (ref 78.0–100.0)
Monocytes Absolute: 0.2 K/uL (ref 0.1–1.0)
Monocytes Relative: 7.1 % (ref 3.0–12.0)
Neutro Abs: 1 K/uL — ABNORMAL LOW (ref 1.4–7.7)
Neutrophils Relative %: 33.5 % — ABNORMAL LOW (ref 43.0–77.0)
Platelets: 218 K/uL (ref 150.0–400.0)
RBC: 4.94 Mil/uL (ref 4.22–5.81)
RDW: 14.2 % (ref 11.5–15.5)
WBC: 2.9 K/uL — ABNORMAL LOW (ref 4.0–10.5)

## 2023-09-30 LAB — COMPREHENSIVE METABOLIC PANEL WITH GFR
ALT: 31 U/L (ref 0–53)
AST: 25 U/L (ref 0–37)
Albumin: 4.8 g/dL (ref 3.5–5.2)
Alkaline Phosphatase: 51 U/L (ref 39–117)
BUN: 14 mg/dL (ref 6–23)
CO2: 26 meq/L (ref 19–32)
Calcium: 9.7 mg/dL (ref 8.4–10.5)
Chloride: 105 meq/L (ref 96–112)
Creatinine, Ser: 1.27 mg/dL (ref 0.40–1.50)
GFR: 66.23 mL/min (ref >60.00)
Glucose, Bld: 94 mg/dL (ref 70–99)
Potassium: 4.1 meq/L (ref 3.5–5.1)
Sodium: 141 meq/L (ref 135–145)
Total Bilirubin: 0.7 mg/dL (ref 0.2–1.2)
Total Protein: 7.5 g/dL (ref 6.0–8.3)

## 2023-09-30 LAB — TSH: TSH: 0.74 u[IU]/mL (ref 0.35–5.50)

## 2023-09-30 LAB — HEMOGLOBIN A1C: Hgb A1c MFr Bld: 6.4 % (ref 4.6–6.5)

## 2023-09-30 LAB — T4, FREE: Free T4: 0.83 ng/dL (ref 0.60–1.60)

## 2023-09-30 NOTE — Progress Notes (Unsigned)
   Subjective:   Patient ID: Grant Miller, male    DOB: Jun 22, 1975, 48 y.o.   MRN: [id number]  HPI The patient is here for physical.  PMH, [facility], social history reviewed and updated  Review of Systems  Objective:  Physical Exam  Vitals:   09/30/23 0955  BP: 114/80  Pulse: 68  Temp: 98.2 F (36.8 C)  TempSrc: Oral  SpO2: 95%  Weight: 229 lb (103.9 kg)  Height: 6' (1.829 m)    Assessment & Plan:

## 2023-09-30 NOTE — Patient Instructions (Addendum)
 We will get you in with the surgeon for hernia surgery.  That area on your hip is a lipoma.

## 2023-10-01 ENCOUNTER — Encounter: Payer: Self-pay | Admitting: Internal Medicine

## 2023-10-01 LAB — TESTOSTERONE TOTAL,FREE,BIO, MALES
Albumin: 4.9 g/dL (ref 3.6–5.1)
Sex Hormone Binding: 15 nmol/L (ref 10–50)
Testosterone, Bioavailable: 147 ng/dL (ref 110.0–575.0)
Testosterone, Free: 65.9 pg/mL (ref 46.0–224.0)
Testosterone: 308 ng/dL (ref 250–827)

## 2023-10-01 LAB — HEPATITIS C ANTIBODY: Hepatitis C Ab: NONREACTIVE

## 2023-10-01 LAB — HIV ANTIBODY (ROUTINE TESTING W REFLEX): HIV 1&2 Ab, 4th Generation: NONREACTIVE

## 2023-10-01 NOTE — Assessment & Plan Note (Signed)
 Checking CBC with diff. No recent infections and has been chronic and fairly stable for years.

## 2023-10-01 NOTE — Assessment & Plan Note (Signed)
Checking CMP and CBC 

## 2023-10-01 NOTE — Assessment & Plan Note (Signed)
 Checking HgA1c and adjust as needed.

## 2023-10-01 NOTE — Assessment & Plan Note (Signed)
 Flu shot yearly. Pneumonia counseled. Tetanus counseled due. Colonoscopy up to date. Counseled about sun safety and mole surveillance. Counseled about the dangers of distracted driving. Given 10 year screening recommendations.

## 2023-10-01 NOTE — Assessment & Plan Note (Signed)
 Checking testosterone  levels and not taking testosterone  in about 1 year.

## 2023-10-01 NOTE — Assessment & Plan Note (Signed)
 Seeing back specialist and they are doing injections to help which he gets relief but for a short time.

## 2023-10-01 NOTE — Assessment & Plan Note (Signed)
 Checking CBC and CMP and TSH and T4.

## 2023-10-03 ENCOUNTER — Ambulatory Visit: Payer: Self-pay | Admitting: Internal Medicine

## 2023-10-03 LAB — GC/CHLAMYDIA PROBE AMP
Chlamydia trachomatis, NAA: NEGATIVE
Neisseria Gonorrhoeae by PCR: NEGATIVE

## 2023-10-03 NOTE — Progress Notes (Signed)
 I have mailed patient his results

## 2023-10-10 ENCOUNTER — Ambulatory Visit: Payer: Self-pay | Admitting: General Surgery

## 2023-10-10 NOTE — H&P (Signed)
 Chief Complaint: RE-CHECK        History of Present Illness: Grant Miller is a 48 y.o. male who is seen today as an office consultation at the request of Dr. Rollene for evaluation of RE-CHECK  .   History of Present Illness Grant Miller is a 48 year old male who presents with persistent back and groin pain.   He has experienced persistent back pain for the past year, which remains constant and significantly impacts daily activities despite cortisone injections. Groin pain is described as a stinging or burning sensation, sometimes radiating to the chest and worsened by leg movements. There is no recent trauma, fever, weight loss, or bowel or bladder incontinence.       Review of Systems: A complete review of systems was obtained from the patient.  I have reviewed this information and discussed as appropriate with the patient.  See HPI as well for other ROS.   Review of Systems  Constitutional:  Negative for fever.  HENT:  Negative for congestion.   Eyes:  Negative for blurred vision.  Respiratory:  Negative for cough, shortness of breath and wheezing.   Cardiovascular:  Negative for chest pain and palpitations.  Gastrointestinal:  Negative for heartburn.  Genitourinary:  Negative for dysuria.  Musculoskeletal:  Negative for myalgias.  Skin:  Negative for rash.  Neurological:  Negative for dizziness and headaches.  Psychiatric/Behavioral:  Negative for depression and suicidal ideas.   All other systems reviewed and are negative.       Medical History: Past Medical History Past Medical History: Diagnosis Date  Arthritis         Problem List There is no problem list on file for this patient.     Past Surgical History History reviewed. No pertinent surgical history.     Allergies Allergies Allergen Reactions  Iodine Unknown      Medications Ordered Prior to Encounter Current Outpatient Medications on File Prior to  Visit Medication Sig Dispense Refill  DULoxetine  (CYMBALTA ) 30 MG DR capsule Take 30 mg by mouth once daily      methocarbamoL  (ROBAXIN ) 500 MG tablet Take by mouth      oxyCODONE -acetaminophen  (PERCOCET) 5-325 mg tablet TAKE 1 TABLET BY MOUTH EVERY 4 HOURS FOR UP TO 5 DAYS AS NEEDED FOR SEVERE PAIN      tamsulosin  (FLOMAX ) 0.4 mg capsule Take 0.4 mg by mouth once daily      testosterone  cypionate (DEPO-TESTOSTERONE ) 200 mg/mL injection ADMINISTER 0.5 ML(100 MG) IN THE MUSCLE EVERY 7 DAYS        No current facility-administered medications on file prior to visit.      Family History Family History Problem Relation Age of Onset  High blood pressure (Hypertension) Mother    Obesity Mother    Coronary Artery Disease (Blocked arteries around heart) Mother    Diabetes Mother    Obesity Father    High blood pressure (Hypertension) Father    Diabetes Father    Coronary Artery Disease (Blocked arteries around heart) Father        Tobacco Use History Social History    Tobacco Use Smoking Status Never Smokeless Tobacco Never      Social History Social History    Socioeconomic History  Marital status: Single Tobacco Use  Smoking status: Never  Smokeless tobacco: Never Vaping Use  Vaping status: Never Used Substance and Sexual Activity  Alcohol use: Not Currently  Drug use: Never    Social Drivers of Health  Financial Resource Strain: High Risk (08/11/2023)   Received from [facility]   Overall Financial Resource Strain (CARDIA)    Difficulty of Paying Living Expenses: Hard Food Insecurity: No Food Insecurity (08/11/2023)   Received from [facility]   Hunger Vital Sign    Within the past 12 months, you worried that your food would run out before you got the money to buy more.: Never true    Within the past 12 months, the food you bought just didn't last and you didn't have money to get more.: Never true Transportation Needs: No Transportation Needs (08/11/2023)    Received from [facility]    Lack of Transportation (Medical): No    Lack of Transportation (Non-Medical): No Physical Activity: Insufficiently Active (08/11/2023)   Received from [facility]   Exercise Vital Sign    On average, how many days per week do you engage in moderate to strenuous exercise (like a brisk walk)?: 3 days    On average, how many minutes do you engage in exercise at this level?: 30 min Stress: Stress Concern Present (08/11/2023)   Received from [facility] - Occupational Stress Questionnaire    Feeling of Stress : Very much Social Connections: Moderately Integrated (08/11/2023)   Received from [facility]   Social Connection and Isolation Panel    In a typical week, how many times do you talk on the phone with family, friends, or neighbors?: More than three times a week    How often do you get together with friends or relatives?: More than three times a week    How often do you attend church or religious services?: More than 4 times per year    Do you belong to any clubs or organizations such as church groups, unions, fraternal or athletic groups, or school groups?: Yes    How often do you attend meetings of the clubs or organizations you belong to?: More than 4 times per year    Are you married, widowed, divorced, separated, never married, or living with a partner?: Never married Housing Stability: Unknown (10/10/2023)   Housing Stability Vital Sign    Homeless in the Last Year: No      Objective:     Vitals:   10/10/23 0930 PainSc: 0-No pain   There is no height or weight on file to calculate BMI. Physical Exam Constitutional:      Appearance: Normal appearance.  HENT:     Head: Normocephalic and atraumatic.     Nose: Nose normal. No congestion.     Mouth/Throat:     Mouth: Mucous membranes are moist.     Pharynx: Oropharynx is clear.  Eyes:     Pupils: Pupils are equal, round, and  reactive to light.  Cardiovascular:     Rate and Rhythm: Normal rate and regular rhythm.     Pulses: Normal pulses.     Heart sounds: Normal heart sounds. No murmur heard.    No friction rub. No gallop.  Pulmonary:     Effort: Pulmonary effort is normal. No respiratory distress.     Breath sounds: Normal breath sounds. No stridor. No wheezing, rhonchi or rales.  Abdominal:     General: Abdomen is flat.     Hernia: A hernia (RIGHT > LEFT) is present. Hernia is present in the left inguinal area and right inguinal area.      Comments: PREV MIDLINE FOR EXLAP  Musculoskeletal:        General: Normal range of motion.     Cervical back: Normal range of motion.  Skin:    General: Skin is warm and dry.  Neurological:     General: No focal deficit present.     Mental Status: He is alert and oriented to person, place, and time.  Psychiatric:        Mood and Affect: Mood normal.        Thought Content: Thought content normal.         Assessment and Plan: Diagnoses and all orders for this visit:   Bilateral inguinal hernia without obstruction or gangrene, recurrence not specified     Eithen Winker is a 48 y.o. male    1.  We will proceed to the OR for a LAP POSSIBLE OPEN BILATERAL inguinal hernia repair with mesh. 2. All risks and benefits were discussed with the patient, to generally include infection, bleeding, damage to surrounding structures, acute and chronic nerve pain, and recurrence. Alternatives were offered and described.  All questions were answered and the patient voiced understanding of the procedure and wishes to proceed at this point.             No follow-ups on file.   Lynda Leos, MD, [facility], [place] [facility]imally Invasive Surgery

## 2023-11-06 ENCOUNTER — Other Ambulatory Visit: Payer: Self-pay

## 2023-11-06 ENCOUNTER — Telehealth: Payer: Self-pay | Admitting: Internal Medicine

## 2023-11-06 MED ORDER — MELOXICAM 15 MG PO TABS: 15.0000 mg | ORAL_TABLET | Freq: Every day | ORAL | 1 refills | Status: AC

## 2023-11-06 NOTE — Telephone Encounter (Signed)
 Sent in

## 2023-11-06 NOTE — Telephone Encounter (Unsigned)
 Copied from CRM [id number]. Topic: Clinical - Medication Refill >> Nov 06, 2023  2:34 PM Aisha D wrote: Medication: meloxicam  (MOBIC ) 15 MG tablet  Has the patient contacted their pharmacy? No (Agent: If no, request that the patient contact the pharmacy for the refill. If patient does not wish to contact the pharmacy document the reason why and proceed with request.) (Agent: If yes, when and what did the pharmacy advise?)  This is the patient's preferred pharmacy:  [id number] [address] [place], [place] - [address] AT [facility] [address] & [facility] [address] [place] [address] Phone: [phone] Fax: [phone]  Is this the correct pharmacy for this prescription? Yes If no, delete pharmacy and type the correct one.   Has the prescription been filled recently? No  Is the patient out of the medication? Yes  Has the patient been seen for an appointment in the last year OR does the patient have an upcoming appointment? Yes  Can we respond through MyChart? No  Agent: Please be advised that Rx refills may take up to 3 business days. We ask that you follow-up with your pharmacy.

## 2023-12-05 NOTE — Telephone Encounter (Signed)
 Error

## 2024-01-01 NOTE — Progress Notes (Signed)
 Surgical Instructions   Your procedure is scheduled on Thursday, November 6th. Report to [facility] Main Entrance A at 7:10 A.M., then check in with the Admitting office. Any questions or running late day of surgery: call [phone]  Questions prior to your surgery date: call [phone], Monday-Friday, 8am-4pm. If you experience any cold or flu symptoms such as cough, fever, chills, shortness of breath, etc. between now and your scheduled surgery, please notify us  at the above number.     Remember:  Do not eat after midnight the night before your surgery   You may drink clear liquids until 6:10 the morning of your surgery.   Clear liquids allowed are: Water, Non-Citrus Juices (without pulp), Clear Carbonated Beverages, Clear Tea (no milk, honey, etc.), Black Coffee Only (NO MILK, CREAM OR POWDERED CREAMER of any kind), and Gatorade.  Patient Instructions  The night before surgery:  No food after midnight. ONLY clear liquids after midnight  The day of surgery (if you do NOT have diabetes):  Drink ONE (1) Pre-Surgery Clear Ensure by 6:10 the morning of surgery. Drink in one sitting. Do not sip.  This drink was given to you during your hospital pre-op appointment visit.  Nothing else to drink after completing the Pre-Surgery Clear Ensure.         If you have questions, please contact your surgeon's office.     Take these medicines the morning of surgery with A SIP OF WATER  DULoxetine  (CYMBALTA )  methocarbamol  (ROBAXIN )    May take these medicines IF NEEDED: tiZANidine (ZANAFLEX)    One week prior to surgery, STOP taking any Aspirin (unless otherwise instructed by your surgeon) Aleve , Naproxen , Ibuprofen , Motrin , Advil , Goody's, BC's, all herbal medications, fish oil, and non-prescription vitamins. This includes your meloxicam  (MOBIC ).                      Do NOT Smoke (Tobacco/Vaping) for 24 hours prior to your procedure.  If you use a CPAP at night, you may bring  your mask/headgear for your overnight stay.   You will be asked to remove any contacts, glasses, piercing's, hearing aid's, dentures/partials prior to surgery. Please bring cases for these items if needed.    Patients discharged the day of surgery will not be allowed to drive home, and someone needs to stay with them for 24 hours.  SURGICAL WAITING ROOM VISITATION Patients may have no more than 2 support people in the waiting area - these visitors may rotate.   Pre-op nurse will coordinate an appropriate time for 1 ADULT support person, who may not rotate, to accompany patient in pre-op.  Children under the age of 29 must have an adult with them who is not the patient and must remain in the main waiting area with an adult.  If the patient needs to stay at the hospital during part of their recovery, the visitor guidelines for inpatient rooms apply.  Please refer to the [facility] website for the visitor guidelines for any additional information.   If you received a COVID test during your pre-op visit  it is requested that you wear a mask when out in public, stay away from anyone that may not be feeling well and notify your surgeon if you develop symptoms. If you have been in contact with anyone that has tested positive in the last 10 days please notify you surgeon.      Pre-operative CHG Bathing Instructions   You can play a key  role in reducing the risk of infection after surgery. Your skin needs to be as free of germs as possible. You can reduce the number of germs on your skin by washing with CHG (chlorhexidine gluconate) soap before surgery. CHG is an antiseptic soap that kills germs and continues to kill germs even after washing.   DO NOT use if you have an allergy to chlorhexidine/CHG or antibacterial soaps. If your skin becomes reddened or irritated, stop using the CHG and notify one of our RNs at [phone].              TAKE A SHOWER THE NIGHT BEFORE SURGERY   Please keep in  mind the following:  DO NOT shave, including legs and underarms, 48 hours prior to surgery.   You may shave your face before/day of surgery.  Place clean sheets on your bed the night before surgery Use a clean washcloth (not used since being washed) for shower. DO NOT sleep with pet's night before surgery.  CHG Shower Instructions:  Wash your face and private area with normal soap. If you choose to wash your hair, wash first with your normal shampoo.  After you use shampoo/soap, rinse your hair and body thoroughly to remove shampoo/soap residue.  Turn the water OFF and apply half the bottle of CHG soap to a CLEAN washcloth.  Apply CHG soap ONLY FROM YOUR NECK DOWN TO YOUR TOES (washing for 3-5 minutes)  DO NOT use CHG soap on face, private areas, open wounds, or sores.  Pay special attention to the area where your surgery is being performed.  If you are having back surgery, having someone wash your back for you may be helpful. Wait 2 minutes after CHG soap is applied, then you may rinse off the CHG soap.  Pat dry with a clean towel  Put on clean pajamas    Additional instructions for the day of surgery: If you choose, you may shower the morning of surgery with an antibacterial soap.  DO NOT APPLY any lotions, deodorants, cologne, or perfumes.   Do not wear jewelry or makeup Do not wear nail polish, gel polish, artificial nails, or any other type of covering on natural nails (fingers and toes) Do not bring valuables to the hospital. [facility] is not responsible for valuables/personal belongings. Put on clean/comfortable clothes.  Please brush your teeth.  Ask your nurse before applying any prescription medications to the skin.

## 2024-01-02 ENCOUNTER — Other Ambulatory Visit: Payer: Self-pay

## 2024-01-02 ENCOUNTER — Encounter (HOSPITAL_COMMUNITY): Payer: Self-pay

## 2024-01-02 ENCOUNTER — Encounter (HOSPITAL_COMMUNITY)
Admission: RE | Admit: 2024-01-02 | Discharge: 2024-01-02 | Disposition: A | Discharge status: Home / Self Care | Source: Ambulatory Visit | Attending: General Surgery | Admitting: General Surgery

## 2024-01-02 VITALS — BP 130/60 | HR 70 | Temp 98.8°F | Resp 18 | Ht 72.0 in | Wt 235.8 lb

## 2024-01-02 DIAGNOSIS — Z01818 Encounter for other preprocedural examination: Secondary | ICD-10-CM | POA: Diagnosis present

## 2024-01-02 LAB — CBC
HCT: 40.1 % (ref 39.0–52.0)
Hemoglobin: 13 g/dL (ref 13.0–17.0)
MCH: 27.1 pg (ref 26.0–34.0)
MCHC: 32.4 g/dL (ref 30.0–36.0)
MCV: 83.7 fL (ref 80.0–100.0)
Platelets: 240 K/uL (ref 150–400)
RBC: 4.79 MIL/uL (ref 4.22–5.81)
RDW: 13.6 % (ref 11.5–15.5)
WBC: 4.1 K/uL (ref 4.0–10.5)
nRBC: 0 % (ref 0.0–0.2)

## 2024-01-02 NOTE — Progress Notes (Signed)
 PCP -  Almarie Cleveland Cardiologist - denies  PPM/ICD - denies Device Orders - n/a Rep Notified - n/a  Chest x-ray - n/a  EKG - n/a Stress Test - denies ECHO - denies Cardiac Cath - denies  Sleep Study - denies CPAP - n/a  NON-diabetic  Last dose of GLP1 agonist-  denies GLP1 instructions: n/a  Blood Thinner Instructions: denies Aspirin Instructions: denies  ERAS Protcol - clears until 0610 PRE-SURGERY Ensure or G2- ensure  COVID TEST- n/a   Anesthesia review: no  Patient denies shortness of breath, fever, cough and chest pain at PAT appointment   All instructions explained to the patient, with a verbal understanding of the material. Patient agrees to go over the instructions while at home for a better understanding. Patient also instructed to self quarantine after being tested for COVID-19. The opportunity to ask questions was provided.

## 2024-01-08 ENCOUNTER — Ambulatory Visit (HOSPITAL_COMMUNITY): Admitting: Anesthesiology

## 2024-01-08 ENCOUNTER — Encounter (HOSPITAL_COMMUNITY): Payer: Self-pay | Admitting: General Surgery

## 2024-01-08 ENCOUNTER — Ambulatory Visit (HOSPITAL_COMMUNITY)
Admission: RE | Admit: 2024-01-08 | Discharge: 2024-01-08 | Disposition: A | Discharge status: Home / Self Care | Attending: General Surgery | Admitting: General Surgery

## 2024-01-08 ENCOUNTER — Other Ambulatory Visit: Payer: Self-pay

## 2024-01-08 ENCOUNTER — Encounter (HOSPITAL_COMMUNITY)
Admission: RE | Disposition: A | Discharge status: Home / Self Care | Payer: Self-pay | Source: Home / Self Care | Attending: General Surgery

## 2024-01-08 DIAGNOSIS — M1990 Unspecified osteoarthritis, unspecified site: Secondary | ICD-10-CM | POA: Insufficient documentation

## 2024-01-08 DIAGNOSIS — Z833 Family history of diabetes mellitus: Secondary | ICD-10-CM | POA: Insufficient documentation

## 2024-01-08 DIAGNOSIS — Z87891 Personal history of nicotine dependence: Secondary | ICD-10-CM | POA: Diagnosis not present

## 2024-01-08 DIAGNOSIS — R7303 Prediabetes: Secondary | ICD-10-CM | POA: Insufficient documentation

## 2024-01-08 DIAGNOSIS — D176 Benign lipomatous neoplasm of spermatic cord: Secondary | ICD-10-CM | POA: Insufficient documentation

## 2024-01-08 DIAGNOSIS — Z79899 Other long term (current) drug therapy: Secondary | ICD-10-CM | POA: Diagnosis not present

## 2024-01-08 DIAGNOSIS — K4020 Bilateral inguinal hernia, without obstruction or gangrene, not specified as recurrent: Secondary | ICD-10-CM | POA: Diagnosis present

## 2024-01-08 DIAGNOSIS — E669 Obesity, unspecified: Secondary | ICD-10-CM | POA: Diagnosis not present

## 2024-01-08 DIAGNOSIS — Z6831 Body mass index (BMI) 31.0-31.9, adult: Secondary | ICD-10-CM

## 2024-01-08 HISTORY — PX: INGUINAL HERNIA REPAIR: SHX194

## 2024-01-08 SURGERY — REPAIR, HERNIA, INGUINAL, LAPAROSCOPIC
Anesthesia: General | Site: Abdomen | Laterality: Bilateral

## 2024-01-08 MED ORDER — PROPOFOL 10 MG/ML IV BOLUS
INTRAVENOUS | Status: AC
Start: 2024-01-08 — End: 2024-01-08
  Filled 2024-01-08: qty 20

## 2024-01-08 MED ORDER — MIDAZOLAM HCL 2 MG/2ML IJ SOLN
INTRAMUSCULAR | Status: AC
  Filled 2024-01-08: qty 2

## 2024-01-08 MED ORDER — ENSURE PRE-SURGERY PO LIQD: 296.0000 mL | Freq: Once | ORAL | Status: DC

## 2024-01-08 MED ORDER — OXYCODONE-ACETAMINOPHEN 5-325 MG PO TABS: 1.0000 | ORAL_TABLET | ORAL | 0 refills | Status: DC | PRN

## 2024-01-08 MED ORDER — MIDAZOLAM HCL 2 MG/2ML IJ SOLN
INTRAMUSCULAR | Status: AC
Start: 2024-01-08 — End: 2024-01-08
  Filled 2024-01-08: qty 2

## 2024-01-08 MED ORDER — FENTANYL CITRATE (PF) 100 MCG/2ML IJ SOLN
INTRAMUSCULAR | Status: DC | PRN
  Administered 2024-01-08 (×2): 50 ug via INTRAVENOUS

## 2024-01-08 MED ORDER — LACTATED RINGERS IV SOLN: INTRAVENOUS | Status: DC

## 2024-01-08 MED ORDER — FENTANYL CITRATE (PF) 100 MCG/2ML IJ SOLN
INTRAMUSCULAR | Status: AC
  Filled 2024-01-08: qty 2

## 2024-01-08 MED ORDER — SUGAMMADEX SODIUM 200 MG/2ML IV SOLN
INTRAVENOUS | Status: DC | PRN
  Administered 2024-01-08: 100 mg via INTRAVENOUS
  Administered 2024-01-08: 200 mg via INTRAVENOUS

## 2024-01-08 MED ORDER — BUPIVACAINE HCL 0.25 % IJ SOLN
INTRAMUSCULAR | Status: DC | PRN
  Administered 2024-01-08: 7 mL

## 2024-01-08 MED ORDER — ROCURONIUM BROMIDE 100 MG/10ML IV SOLN
INTRAVENOUS | Status: DC | PRN
  Administered 2024-01-08: 70 mg via INTRAVENOUS

## 2024-01-08 MED ORDER — KETOROLAC TROMETHAMINE 30 MG/ML IJ SOLN
INTRAMUSCULAR | Status: DC | PRN
  Administered 2024-01-08: 30 mg via INTRAVENOUS

## 2024-01-08 MED ORDER — ACETAMINOPHEN 500 MG PO TABS
1000.0000 mg | ORAL_TABLET | ORAL | Status: AC
  Administered 2024-01-08: 1000 mg via ORAL
  Filled 2024-01-08: qty 2

## 2024-01-08 MED ORDER — ORAL CARE MOUTH RINSE
15.0000 mL | Freq: Once | OROMUCOSAL | Status: AC
Start: 2024-01-08 — End: 2024-01-08

## 2024-01-08 MED ORDER — CHLORHEXIDINE GLUCONATE CLOTH 2 % EX PADS: 6.0000 | MEDICATED_PAD | Freq: Once | CUTANEOUS | Status: DC

## 2024-01-08 MED ORDER — PROPOFOL 10 MG/ML IV BOLUS
INTRAVENOUS | Status: DC | PRN
  Administered 2024-01-08: 40 mg via INTRAVENOUS
  Administered 2024-01-08: 50 mg via INTRAVENOUS
  Administered 2024-01-08: 150 mg via INTRAVENOUS

## 2024-01-08 MED ORDER — MIDAZOLAM HCL 5 MG/5ML IJ SOLN
INTRAMUSCULAR | Status: DC | PRN
  Administered 2024-01-08 (×2): 2 mg via INTRAVENOUS

## 2024-01-08 MED ORDER — DEXAMETHASONE SOD PHOSPHATE PF 10 MG/ML IJ SOLN
INTRAMUSCULAR | Status: DC | PRN
  Administered 2024-01-08: 10 mg via INTRAVENOUS

## 2024-01-08 MED ORDER — CHLORHEXIDINE GLUCONATE 0.12 % MT SOLN
15.0000 mL | Freq: Once | OROMUCOSAL | Status: AC
  Administered 2024-01-08: 15 mL via OROMUCOSAL
  Filled 2024-01-08: qty 15

## 2024-01-08 MED ORDER — DEXMEDETOMIDINE HCL IN NACL 80 MCG/20ML IV SOLN
INTRAVENOUS | Status: DC | PRN
  Administered 2024-01-08: 8 ug via INTRAVENOUS
  Administered 2024-01-08: 4 ug via INTRAVENOUS
  Administered 2024-01-08: 8 ug via INTRAVENOUS

## 2024-01-08 MED ORDER — BUPIVACAINE-EPINEPHRINE (PF) 0.25% -1:200000 IJ SOLN
INTRAMUSCULAR | Status: AC
  Filled 2024-01-08: qty 120

## 2024-01-08 MED ORDER — ONDANSETRON HCL 4 MG/2ML IJ SOLN
INTRAMUSCULAR | Status: DC | PRN
  Administered 2024-01-08: 4 mg via INTRAVENOUS

## 2024-01-08 MED ORDER — CEFAZOLIN SODIUM-DEXTROSE 2-4 GM/100ML-% IV SOLN
2.0000 g | INTRAVENOUS | Status: AC
  Administered 2024-01-08: 2 g via INTRAVENOUS
  Filled 2024-01-08: qty 100

## 2024-01-08 MED ORDER — LIDOCAINE HCL (CARDIAC) PF 100 MG/5ML IV SOSY
PREFILLED_SYRINGE | INTRAVENOUS | Status: DC | PRN
  Administered 2024-01-08: 100 mg via INTRAVENOUS

## 2024-01-08 SURGICAL SUPPLY — 33 items
BAG COUNTER SPONGE SURGICOUNT (BAG) ×1 IMPLANT
CANISTER SUCTION 3000ML PPV (SUCTIONS) IMPLANT
COVER SURGICAL LIGHT HANDLE (MISCELLANEOUS) ×1 IMPLANT
DERMABOND ADVANCED .7 DNX12 (GAUZE/BANDAGES/DRESSINGS) ×1 IMPLANT
DISSECTOR BLUNT TIP ENDO 5MM (MISCELLANEOUS) IMPLANT
ELECTRODE REM PT RTRN 9FT ADLT (ELECTROSURGICAL) ×1 IMPLANT
ENDOLOOP SUT PDS II 0 18 (SUTURE) IMPLANT
GLOVE BIO SURGEON STRL SZ7.5 (GLOVE) ×2 IMPLANT
GOWN STRL REUS W/ TWL LRG LVL3 (GOWN DISPOSABLE) ×2 IMPLANT
GOWN STRL REUS W/ TWL XL LVL3 (GOWN DISPOSABLE) ×1 IMPLANT
IRRIGATION SUCT STRKRFLW 2 WTP (MISCELLANEOUS) IMPLANT
KIT BASIN OR (CUSTOM PROCEDURE TRAY) ×1 IMPLANT
KIT TURNOVER KIT B (KITS) ×1 IMPLANT
MESH 3DMAX 4X6 LT LRG (Mesh General) IMPLANT
MESH 3DMAX 4X6 RT LRG (Mesh General) IMPLANT
NDL INSUFFLATION 14GA 120MM (NEEDLE) IMPLANT
NEEDLE INSUFFLATION 14GA 120MM (NEEDLE) IMPLANT
PAD ARMBOARD POSITIONER FOAM (MISCELLANEOUS) ×2 IMPLANT
RELOAD STAPLE 4.0 BLU F/HERNIA (INSTRUMENTS) IMPLANT
SCISSORS LAP 5X35 DISP (ENDOMECHANICALS) ×1 IMPLANT
SET TUBE SMOKE EVAC HIGH FLOW (TUBING) ×1 IMPLANT
SOLN 0.9% NACL POUR BTL 1000ML (IV SOLUTION) ×1 IMPLANT
SOLN STERILE WATER BTL 1000 ML (IV SOLUTION) ×1 IMPLANT
STAPLER HERNIA 12 8.5 360D (INSTRUMENTS) IMPLANT
SUT MNCRL AB 4-0 PS2 18 (SUTURE) ×1 IMPLANT
SUT VIC AB 1 CT1 27XBRD ANBCTR (SUTURE) IMPLANT
TOWEL GREEN STERILE (TOWEL DISPOSABLE) ×1 IMPLANT
TOWEL GREEN STERILE FF (TOWEL DISPOSABLE) ×1 IMPLANT
TRAY LAPAROSCOPIC MC (CUSTOM PROCEDURE TRAY) ×1 IMPLANT
TROCAR OPTICAL SHORT 5MM (TROCAR) ×1 IMPLANT
TROCAR OPTICAL SLV SHORT 5MM (TROCAR) ×1 IMPLANT
TROCAR Z THREAD OPTICAL 12X100 (TROCAR) ×1 IMPLANT
WARMER LAPAROSCOPE (MISCELLANEOUS) ×1 IMPLANT

## 2024-01-08 NOTE — Anesthesia Preprocedure Evaluation (Addendum)
 Anesthesia Evaluation  Patient identified by MRN, date of birth, ID band Patient awake    Reviewed: Allergy & Precautions, NPO status , Patient's Chart, lab work & pertinent test results  History of Anesthesia Complications Negative for: history of anesthetic complications  Airway Mallampati: II  TM Distance: >3 FB Neck ROM: Full    Dental  (+) Teeth Intact, Dental Advisory Given, Caps   Pulmonary former smoker   breath sounds clear to auscultation       Cardiovascular  Rhythm:Regular Rate:Normal     Neuro/Psych  PSYCHIATRIC DISORDERS Anxiety Depression       GI/Hepatic ,,,(+) Cirrhosis  (CT suggestive of Cirrhosis, Labs WNL)    substance abuse (Chronic Percocet Use)    Endo/Other  Diabetes: Pre-Diabetes.  Testosterone  Replacement Therapy  Renal/GU Renal InsufficiencyRenal diseaseBaseline Cr ~1.2     Musculoskeletal  (+) Arthritis , Osteoarthritis,  narcotic dependent  Abdominal   Peds  Hematology Hx of Neutropenia (Benign per Heme 2024)   Anesthesia Other Findings   Reproductive/Obstetrics                              Anesthesia Physical Anesthesia Plan  ASA: 3  Anesthesia Plan: General   Post-op Pain Management:    Induction: Intravenous  PONV Risk Score and Plan: 2 and Ondansetron , Dexamethasone and Treatment may vary due to age or medical condition  Airway Management Planned: Oral ETT and LMA  Additional Equipment:   Intra-op Plan:   Post-operative Plan: Extubation in OR  Informed Consent:      Dental advisory given  Plan Discussed with: CRNA  Anesthesia Plan Comments:          Anesthesia Quick Evaluation

## 2024-01-08 NOTE — H&P (Signed)
 Chief Complaint: RE-CHECK        History of Present Illness: Grant Miller is a 48 y.o. male who is seen today as an office consultation at the request of Dr. Rollene for evaluation of RE-CHECK  .   History of Present Illness Grant Miller is a 48 year old male who presents with persistent back and groin pain.   He has experienced persistent back pain for the past year, which remains constant and significantly impacts daily activities despite cortisone injections. Groin pain is described as a stinging or burning sensation, sometimes radiating to the chest and worsened by leg movements. There is no recent trauma, fever, weight loss, or bowel or bladder incontinence.       Review of Systems: A complete review of systems was obtained from the patient.  I have reviewed this information and discussed as appropriate with the patient.  See HPI as well for other ROS.   Review of Systems  Constitutional:  Negative for fever.  HENT:  Negative for congestion.   Eyes:  Negative for blurred vision.  Respiratory:  Negative for cough, shortness of breath and wheezing.   Cardiovascular:  Negative for chest pain and palpitations.  Gastrointestinal:  Negative for heartburn.  Genitourinary:  Negative for dysuria.  Musculoskeletal:  Negative for myalgias.  Skin:  Negative for rash.  Neurological:  Negative for dizziness and headaches.  Psychiatric/Behavioral:  Negative for depression and suicidal ideas.   All other systems reviewed and are negative.       Medical History: Past Medical History Past Medical History: Diagnosis Date  Arthritis         Problem List There is no problem list on file for this patient.     Past Surgical History History reviewed. No pertinent surgical history.     Allergies Allergies Allergen Reactions  Iodine Unknown      Medications Ordered Prior to Encounter Current Outpatient Medications on File Prior to  Visit Medication Sig Dispense Refill  DULoxetine  (CYMBALTA ) 30 MG DR capsule Take 30 mg by mouth once daily      methocarbamoL  (ROBAXIN ) 500 MG tablet Take by mouth      oxyCODONE -acetaminophen  (PERCOCET) 5-325 mg tablet TAKE 1 TABLET BY MOUTH EVERY 4 HOURS FOR UP TO 5 DAYS AS NEEDED FOR SEVERE PAIN      tamsulosin  (FLOMAX ) 0.4 mg capsule Take 0.4 mg by mouth once daily      testosterone  cypionate (DEPO-TESTOSTERONE ) 200 mg/mL injection ADMINISTER 0.5 ML(100 MG) IN THE MUSCLE EVERY 7 DAYS        No current facility-administered medications on file prior to visit.      Family History Family History Problem Relation Age of Onset  High blood pressure (Hypertension) Mother    Obesity Mother    Coronary Artery Disease (Blocked arteries around heart) Mother    Diabetes Mother    Obesity Father    High blood pressure (Hypertension) Father    Diabetes Father    Coronary Artery Disease (Blocked arteries around heart) Father        Tobacco Use History Social History    Tobacco Use Smoking Status Never Smokeless Tobacco Never      Social History Social History    Socioeconomic History  Marital status: Single Tobacco Use  Smoking status: Never  Smokeless tobacco: Never Vaping Use  Vaping status: Never Used Substance and Sexual Activity  Alcohol use: Not Currently  Drug use: Never    Social Drivers of Health  Financial Resource Strain: High Risk (08/11/2023)   Received from [facility]   Overall Financial Resource Strain (CARDIA)    Difficulty of Paying Living Expenses: Hard Food Insecurity: No Food Insecurity (08/11/2023)   Received from [facility]   Hunger Vital Sign    Within the past 12 months, you worried that your food would run out before you got the money to buy more.: Never true    Within the past 12 months, the food you bought just didn't last and you didn't have money to get more.: Never true Transportation Needs: No Transportation Needs (08/11/2023)    Received from [facility]    Lack of Transportation (Medical): No    Lack of Transportation (Non-Medical): No Physical Activity: Insufficiently Active (08/11/2023)   Received from [facility]   Exercise Vital Sign    On average, how many days per week do you engage in moderate to strenuous exercise (like a brisk walk)?: 3 days    On average, how many minutes do you engage in exercise at this level?: 30 min Stress: Stress Concern Present (08/11/2023)   Received from [facility] - Occupational Stress Questionnaire    Feeling of Stress : Very much Social Connections: Moderately Integrated (08/11/2023)   Received from [facility]   Social Connection and Isolation Panel    In a typical week, how many times do you talk on the phone with family, friends, or neighbors?: More than three times a week    How often do you get together with friends or relatives?: More than three times a week    How often do you attend church or religious services?: More than 4 times per year    Do you belong to any clubs or organizations such as church groups, unions, fraternal or athletic groups, or school groups?: Yes    How often do you attend meetings of the clubs or organizations you belong to?: More than 4 times per year    Are you married, widowed, divorced, separated, never married, or living with a partner?: Never married Housing Stability: Unknown (10/10/2023)   Housing Stability Vital Sign    Homeless in the Last Year: No      Objective:     Vitals:   10/10/23 0930 PainSc: 0-No pain   There is no height or weight on file to calculate BMI. Physical Exam Constitutional:      Appearance: Normal appearance.  HENT:     Head: Normocephalic and atraumatic.     Nose: Nose normal. No congestion.     Mouth/Throat:     Mouth: Mucous membranes are moist.     Pharynx: Oropharynx is clear.  Eyes:     Pupils: Pupils are equal, round, and  reactive to light.  Cardiovascular:     Rate and Rhythm: Normal rate and regular rhythm.     Pulses: Normal pulses.     Heart sounds: Normal heart sounds. No murmur heard.    No friction rub. No gallop.  Pulmonary:     Effort: Pulmonary effort is normal. No respiratory distress.     Breath sounds: Normal breath sounds. No stridor. No wheezing, rhonchi or rales.  Abdominal:     General: Abdomen is flat.     Hernia: A hernia (RIGHT > LEFT) is present. Hernia is present in the left inguinal area and right inguinal area.      Comments: PREV MIDLINE FOR EXLAP  Musculoskeletal:        General: Normal range of motion.     Cervical back: Normal range of motion.  Skin:    General: Skin is warm and dry.  Neurological:     General: No focal deficit present.     Mental Status: He is alert and oriented to person, place, and time.  Psychiatric:        Mood and Affect: Mood normal.        Thought Content: Thought content normal.         Assessment and Plan: Diagnoses and all orders for this visit:   Bilateral inguinal hernia without obstruction or gangrene, recurrence not specified     Grant Miller is a 48 y.o. male    1.  We will proceed to the OR for a LAP POSSIBLE OPEN BILATERAL inguinal hernia repair with mesh. 2. All risks and benefits were discussed with the patient, to generally include infection, bleeding, damage to surrounding structures, acute and chronic nerve pain, and recurrence. Alternatives were offered and described.  All questions were answered and the patient voiced understanding of the procedure and wishes to proceed at this point.             No follow-ups on file.   Lynda Leos, MD, [facility], [place] [facility]ally [facility]vasive Surgery

## 2024-01-08 NOTE — Anesthesia Procedure Notes (Signed)
 Procedure Name: Intubation Date/Time: 01/08/2024 9:18 AM  Performed by: Delores Duwaine SAUNDERS, CRNAPre-anesthesia Checklist: Patient identified, Emergency Drugs available, Suction available and Patient being monitored Patient Re-evaluated:Patient Re-evaluated prior to induction Oxygen Delivery Method: Circle System Utilized Preoxygenation: Pre-oxygenation with 100% oxygen Induction Type: IV induction Ventilation: Mask ventilation without difficulty Laryngoscope Size: Mac and 4 Grade View: Grade II Tube type: Oral Tube size: 7.5 mm Number of attempts: 1 Airway Equipment and Method: Stylet and Oral airway Placement Confirmation: ETT inserted through vocal cords under direct vision, positive ETCO2 and breath sounds checked- equal and bilateral Secured at: 23 cm Tube secured with: Tape Dental Injury: Teeth and Oropharynx as per pre-operative assessment

## 2024-01-08 NOTE — Discharge Instructions (Signed)

## 2024-01-08 NOTE — Op Note (Signed)
 01/08/2024  10:13 AM  PATIENT:  Grant Miller  48 y.o. male  PRE-OPERATIVE DIAGNOSIS:  BILATERAL INGUINAL HERNIA  POST-OPERATIVE DIAGNOSIS:  BILATERAL INGUINAL HERNIA, BILATERAL CORD LIPOMAS  PROCEDURE:  Procedure(s) with comments: REPAIR, HERNIA, INGUINAL, LAPAROSCOPIC (Bilateral) - LAPAROSCOPIC  BILATERAL INGUINAL HERNIA REPAIR WITH MESH  SURGEON:  Surgeons and Role:    * Rubin Calamity, MD - Primary   ASSISTANTS: Waddell Collier, RNFA   ANESTHESIA:   local and general  EBL:  minimal   BLOOD ADMINISTERED:none  DRAINS: none   LOCAL MEDICATIONS USED:  BUPIVICAINE   SPECIMEN:  No Specimen  DISPOSITION OF SPECIMEN:  N/A  COUNTS:  YES  TOURNIQUET:  * No tourniquets in log *  DICTATION: .Dragon Dictation    Counts: reported as correct x 2  Findings:  The patient had a small right t& left indirect hernias and cord lipomas  Indications for procedure:  The patient is a 48 year old male with a bilateral hernias for several months. Patient complained of symptomatology to his bilateral inguinal areas. The patient was taken back for elective inguinal hernia repair.  Details of the procedure:   The patient was taken back to the operating room. The patient was placed in supine position with bilateral SCDs in place.  The patient underwent GETA.  The patient was prepped and draped in the usual sterile fashion.  After appropriate anitbiotics were confirmed, a time-out was confirmed and all facts were verified.  0.25% Marcaine was used to infiltrate the umbilical area. A 11-blade was used to cut down the skin and blunt dissection was used to get the anterior fashion.  The anterior fascia was incised approximately 1 cm and the muscles were retracted laterally. Blunt dissection was then used to create a space in the preperitoneal area. At this time a 10 mm camera was then introduced into the space and advanced the pubic tubercle and a 12 mm trocar was placed over this and insufflation  was started.   At this time and space was created from medial to laterally the preperitoneal space.  Cooper's ligament was initially cleaned off. The spermatic cord was identified and dissected away from the cremesteric muscle fibers.  This was circumferentially dissected. The hernia was seen in the indirect space. Dissection of the hernia sac was undertaken the vas deferens was identified and protected in all parts of the case.  The peritoneum was dissected back to approximate level of the umbilicus.  There was a small right sided cord lipoma dissected out of the internal inguinal ring.   There was a small tear into the hernia sac. A Veress needle right upper quadrant to help evacuate the intraperitoneal air.    I turned my attention to the left side. At this time and space was created from medial to laterally the preperitoneal space.  Cooper's ligament was initially cleaned off. The spermatic cord was identified and dissected away from the cremesteric muscle fibers.  This was circumferentially dissected. The hernia was seen in the indirect space. Dissection of the hernia sac was undertaken the vas deferens was identified and protected in all parts of the case.  The peritoneum was dissected back to approximate level of the umbilicus.  There was a small left sided cord lipoma dissected out of the internal inguinal ring.  A Bard 3D Max mesh, size: Large, was  introduced into the preperitoneal space.  The mesh was brought over to cover the direct and indirect and femoral hernia spaces.  This was anchored into place  and secured to Cooper's ligament with 4.72mm staples from a [place] hernia stapler. It was anchored to the anterior abdominal wall with 4.8 mm staples. The hernia sac was seen lying posterior to the mesh. There was no staples placed laterally.   The holes in the peritoneum was closed using staples from the [place] hernia stapler  The insufflation was evacuated and the peritoneum was seen  posterior to the mesh bilaterally. The trochars were removed. The anterior fascia was reapproximated using #1 Vicryl on a UR- 6.  Intra-abdominal air was evacuated and the Veress needle removed. The skin was reapproximated using 4-0 Monocryl subcuticular fashion and was dressed with Dermabond. The  patient was awakened from general anesthesia and taken to recovery in stable condition.    PLAN OF CARE: Discharge to home after PACU  PATIENT DISPOSITION:  PACU - hemodynamically stable.   Delay start of Pharmacological VTE agent (>24hrs) due to surgical blood loss or risk of bleeding: not applicable

## 2024-01-08 NOTE — Transfer of Care (Signed)
 Immediate Anesthesia Transfer of Care Note  Patient: Grant Miller  Procedure(s) Performed: REPAIR, HERNIA, INGUINAL, LAPAROSCOPIC (Bilateral: Abdomen)  Patient Location: PACU  Anesthesia Type:General  Level of Consciousness: sedated  Airway & Oxygen Therapy: Patient Spontanous Breathing  Post-op Assessment: Report given to RN  Post vital signs: Reviewed and stable  Last Vitals:  Vitals Value Taken Time  BP 91/49 01/08/24 10:30  Temp    Pulse 65 01/08/24 10:31  Resp 12 01/08/24 10:31  SpO2 95 % 01/08/24 10:31  Vitals shown include unfiled device data.  Last Pain:  Vitals:   01/08/24 1028  PainSc: Asleep      Patients Stated Pain Goal: 4 (01/08/24 0802)  Complications: No notable events documented.

## 2024-01-08 NOTE — Anesthesia Postprocedure Evaluation (Signed)
 Anesthesia Post Note  Patient: Grant Miller  Procedure(s) Performed: REPAIR, HERNIA, INGUINAL, LAPAROSCOPIC (Bilateral: Abdomen)     Patient location during evaluation: PACU Anesthesia Type: General Level of consciousness: awake Pain management: pain level controlled Vital Signs Assessment: post-procedure vital signs reviewed and stable Respiratory status: spontaneous breathing Cardiovascular status: blood pressure returned to baseline Postop Assessment: no apparent nausea or vomiting Anesthetic complications: no   No notable events documented.  Last Vitals:  Vitals:   01/08/24 1045 01/08/24 1100  BP: 97/61 111/62  Pulse: (!) 58 (!) 52  Resp: 12 12  Temp:  36.9 C  SpO2: 94% 96%    Last Pain:  Vitals:   01/08/24 1028  PainSc: Asleep                 Lauraine KATHEE [place]

## 2024-01-09 ENCOUNTER — Encounter (HOSPITAL_COMMUNITY): Payer: Self-pay | Admitting: General Surgery

## 2024-03-05 ENCOUNTER — Encounter: Payer: Self-pay | Admitting: Internal Medicine

## 2024-03-05 ENCOUNTER — Ambulatory Visit: Admitting: Internal Medicine

## 2024-03-05 VITALS — BP 122/80 | HR 54 | Temp 97.9°F | Ht 72.0 in | Wt 246.2 lb

## 2024-03-05 DIAGNOSIS — R7303 Prediabetes: Secondary | ICD-10-CM

## 2024-03-05 DIAGNOSIS — M47816 Spondylosis without myelopathy or radiculopathy, lumbar region: Secondary | ICD-10-CM | POA: Diagnosis not present

## 2024-03-05 DIAGNOSIS — Z113 Encounter for screening for infections with a predominantly sexual mode of transmission: Secondary | ICD-10-CM

## 2024-03-05 DIAGNOSIS — D709 Neutropenia, unspecified: Secondary | ICD-10-CM

## 2024-03-05 DIAGNOSIS — E291 Testicular hypofunction: Secondary | ICD-10-CM

## 2024-03-05 LAB — CBC
HCT: 38.5 % — ABNORMAL LOW (ref 39.0–52.0)
Hemoglobin: 12.7 g/dL — ABNORMAL LOW (ref 13.0–17.0)
MCHC: 33.1 g/dL (ref 30.0–36.0)
MCV: 81.7 fl (ref 78.0–100.0)
Platelets: 227 K/uL (ref 150.0–400.0)
RBC: 4.72 Mil/uL (ref 4.22–5.81)
RDW: 14.4 % (ref 11.5–15.5)
WBC: 3.7 K/uL — ABNORMAL LOW (ref 4.0–10.5)

## 2024-03-05 LAB — COMPREHENSIVE METABOLIC PANEL WITH GFR
ALT: 28 U/L (ref 3–53)
AST: 30 U/L (ref 5–37)
Albumin: 4.6 g/dL (ref 3.5–5.2)
Alkaline Phosphatase: 56 U/L (ref 39–117)
BUN: 13 mg/dL (ref 6–23)
CO2: 27 meq/L (ref 19–32)
Calcium: 9.4 mg/dL (ref 8.4–10.5)
Chloride: 104 meq/L (ref 96–112)
Creatinine, Ser: 1.07 mg/dL (ref 0.40–1.50)
GFR: 81.11 mL/min
Glucose, Bld: 85 mg/dL (ref 70–99)
Potassium: 3.9 meq/L (ref 3.5–5.1)
Sodium: 140 meq/L (ref 135–145)
Total Bilirubin: 0.6 mg/dL (ref 0.2–1.2)
Total Protein: 7.3 g/dL (ref 6.0–8.3)

## 2024-03-05 LAB — LIPID PANEL
Cholesterol: 186 mg/dL (ref 28–200)
HDL: 35 mg/dL — ABNORMAL LOW
LDL Cholesterol: 137 mg/dL — ABNORMAL HIGH (ref 10–99)
NonHDL: 150.97
Total CHOL/HDL Ratio: 5
Triglycerides: 69 mg/dL (ref 10.0–149.0)
VLDL: 13.8 mg/dL (ref 0.0–40.0)

## 2024-03-05 LAB — HEMOGLOBIN A1C: Hgb A1c MFr Bld: 6.5 % (ref 4.6–6.5)

## 2024-03-05 NOTE — Progress Notes (Signed)
" ° °  Subjective:   Patient ID: Grant Miller, male    DOB: Oct 06, 1975, 49 y.o.   MRN: [id number]  Discussed the use of AI scribe software for clinical note transcription with the patient, who gave verbal consent to proceed.  History of Present Illness Grant Miller KD is a 49 year old male who presents for follow-up on colon polyps and post-hernia surgery recovery.  He is following up on colon polyps discovered during a colonoscopy in 2022. Initially informed of five polyps, it was later clarified that there were sixteen. All polyps were sent to pathology to assess his potential for malignancy.  He underwent hernia surgery on January 6th. Post-surgery, he has experienced improvement in symptoms, particularly the resolution of pain that radiated to his testicles. The hernia had been worsening over the years prior to surgery. He still experiences persistent back pain, which he describes as 'crazy', despite engaging in exercises.  He reports residual pain in the stomach area, particularly around the surgical scars. No current issues with diarrhea or constipation.  He mentions a history of low energy and mood changes, which he associates with testosterone  levels. He previously used testosterone  supplements but stopped due to side effects, despite experiencing increased energy while on them.  Review of Systems  Constitutional: Negative.   HENT: Negative.    Eyes: Negative.   Respiratory:  Negative for cough, chest tightness and shortness of breath.   Cardiovascular:  Negative for chest pain, palpitations and leg swelling.  Gastrointestinal:  Negative for abdominal distention, abdominal pain, constipation, diarrhea, nausea and vomiting.  Musculoskeletal:  Positive for arthralgias and back pain.  Skin: Negative.   Neurological: Negative.   Psychiatric/Behavioral: Negative.      Objective:  Physical Exam Constitutional:      Appearance: He is well-developed.  HENT:     Head: Normocephalic  and atraumatic.  Cardiovascular:     Rate and Rhythm: Normal rate and regular rhythm.  Pulmonary:     Effort: Pulmonary effort is normal. No respiratory distress.     Breath sounds: Normal breath sounds. No wheezing or rales.  Abdominal:     General: Bowel sounds are normal. There is no distension.     Palpations: Abdomen is soft.     Tenderness: There is no abdominal tenderness.  Musculoskeletal:        General: Tenderness present.     Cervical back: Normal range of motion.  Skin:    General: Skin is warm and dry.  Neurological:     Mental Status: He is alert and oriented to person, place, and time.     Coordination: Coordination normal.     Vitals:   03/05/24 1051  BP: 122/80  Pulse: (!) 54  Temp: 97.9 F (36.6 C)  TempSrc: Oral  SpO2: 98%  Weight: 246 lb 3.2 oz (111.7 kg)  Height: 6' (1.829 m)    "

## 2024-03-05 NOTE — Assessment & Plan Note (Addendum)
 Still having pain from this and will continue to encourage activity. Checking CMP to monitor meloxicam  treatment.

## 2024-03-05 NOTE — Assessment & Plan Note (Signed)
 Checking HgA1c and adjust as needed.

## 2024-03-05 NOTE — Patient Instructions (Signed)
 SABRA

## 2024-03-05 NOTE — Assessment & Plan Note (Signed)
 Currently off testosterone  treatment and may want to resume. Checking levels today.

## 2024-03-05 NOTE — Assessment & Plan Note (Signed)
 Checking CBC for stability.

## 2024-03-06 LAB — TESTOSTERONE TOTAL,FREE,BIO, MALES
Albumin: 4.5 g/dL (ref 3.6–5.1)
Sex Hormone Binding: 16 nmol/L (ref 10–50)
Testosterone: 210 ng/dL — ABNORMAL LOW (ref 250–827)

## 2024-03-06 LAB — SYPHILIS: RPR W/REFLEX TO RPR TITER AND TREPONEMAL ANTIBODIES, TRADITIONAL SCREENING AND DIAGNOSIS ALGORITHM: RPR Ser Ql: NONREACTIVE

## 2024-03-06 LAB — HIV ANTIBODY (ROUTINE TESTING W REFLEX)
HIV 1&2 Ab, 4th Generation: NONREACTIVE
HIV FINAL INTERPRETATION: NEGATIVE

## 2024-03-07 LAB — GC/CHLAMYDIA PROBE AMP
Chlamydia trachomatis, NAA: NEGATIVE
Neisseria Gonorrhoeae by PCR: NEGATIVE

## 2024-03-10 ENCOUNTER — Ambulatory Visit: Payer: Self-pay | Admitting: Internal Medicine

## 2024-08-11 ENCOUNTER — Ambulatory Visit
